# Patient Record
Sex: Female | Born: 1990 | Race: White | Hispanic: No | Marital: Single | Smoking: Never smoker
Health system: Southern US, Community
[De-identification: ages and names within clinical notes are randomized; demographics above are authoritative.]

## PROBLEM LIST (undated history)

## (undated) DIAGNOSIS — G589 Mononeuropathy, unspecified: Secondary | ICD-10-CM

## (undated) HISTORY — PX: COSMETIC SURGERY: SHX468

---

## 2021-03-29 ENCOUNTER — Emergency Department
Admission: EM | Admit: 2021-03-29 | Discharge: 2021-03-29 | Disposition: A | Discharge status: Home / Self Care | Payer: Self-pay | Attending: Emergency Medicine | Admitting: Emergency Medicine

## 2021-03-29 ENCOUNTER — Encounter: Payer: Self-pay | Admitting: Intensive Care

## 2021-03-29 ENCOUNTER — Other Ambulatory Visit: Payer: Self-pay

## 2021-03-29 DIAGNOSIS — M5441 Lumbago with sciatica, right side: Secondary | ICD-10-CM | POA: Insufficient documentation

## 2021-03-29 DIAGNOSIS — G8929 Other chronic pain: Secondary | ICD-10-CM | POA: Insufficient documentation

## 2021-03-29 DIAGNOSIS — M5442 Lumbago with sciatica, left side: Secondary | ICD-10-CM | POA: Insufficient documentation

## 2021-03-29 MED ORDER — MELOXICAM 15 MG PO TABS: 15.0000 mg | ORAL_TABLET | Freq: Every day | ORAL | 0 refills | Status: DC

## 2021-03-29 MED ORDER — METHOCARBAMOL 500 MG PO TABS: 500.0000 mg | ORAL_TABLET | Freq: Four times a day (QID) | ORAL | 0 refills | Status: DC

## 2021-03-29 MED ORDER — HYDROCODONE-ACETAMINOPHEN 5-325 MG PO TABS
1.0000 | ORAL_TABLET | Freq: Once | ORAL | Status: AC
  Administered 2021-03-29: 1 via ORAL
  Filled 2021-03-29: qty 1

## 2021-03-29 MED ORDER — ORPHENADRINE CITRATE 30 MG/ML IJ SOLN
60.0000 mg | Freq: Once | INTRAMUSCULAR | Status: AC
  Administered 2021-03-29: 60 mg via INTRAMUSCULAR
  Filled 2021-03-29: qty 2

## 2021-03-29 MED ORDER — DEXAMETHASONE SODIUM PHOSPHATE 10 MG/ML IJ SOLN
10.0000 mg | Freq: Once | INTRAMUSCULAR | Status: AC
  Administered 2021-03-29: 10 mg via INTRAMUSCULAR
  Filled 2021-03-29: qty 1
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
## 2021-03-29 NOTE — ED Notes (Signed)
ASked patient to obtain a urine specimen and reports she has a shy bladder and can't pee on spot and usually has to get a bunch of fluids before she can void.  Asked patient to put on gown and patient did not want too.  Explained she will need to get in gown for MD evaluation and if test need to be ran.  Patient attempting to obtain urine specimen.
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
## 2021-03-31 ENCOUNTER — Emergency Department
Admission: EM | Admit: 2021-03-31 | Discharge: 2021-03-31 | Disposition: A | Discharge status: Home / Self Care | Payer: Self-pay | Attending: Emergency Medicine | Admitting: Emergency Medicine

## 2021-03-31 ENCOUNTER — Emergency Department: Payer: Self-pay

## 2021-03-31 ENCOUNTER — Other Ambulatory Visit: Payer: Self-pay

## 2021-03-31 DIAGNOSIS — G43001 Migraine without aura, not intractable, with status migrainosus: Secondary | ICD-10-CM | POA: Insufficient documentation

## 2021-03-31 DIAGNOSIS — M5441 Lumbago with sciatica, right side: Secondary | ICD-10-CM | POA: Insufficient documentation

## 2021-03-31 DIAGNOSIS — R42 Dizziness and giddiness: Secondary | ICD-10-CM | POA: Insufficient documentation

## 2021-03-31 DIAGNOSIS — G8929 Other chronic pain: Secondary | ICD-10-CM | POA: Insufficient documentation

## 2021-03-31 DIAGNOSIS — R200 Anesthesia of skin: Secondary | ICD-10-CM | POA: Insufficient documentation

## 2021-03-31 DIAGNOSIS — M5442 Lumbago with sciatica, left side: Secondary | ICD-10-CM | POA: Insufficient documentation

## 2021-03-31 DIAGNOSIS — D72829 Elevated white blood cell count, unspecified: Secondary | ICD-10-CM | POA: Insufficient documentation

## 2021-03-31 DIAGNOSIS — M546 Pain in thoracic spine: Secondary | ICD-10-CM | POA: Insufficient documentation

## 2021-03-31 DIAGNOSIS — M542 Cervicalgia: Secondary | ICD-10-CM | POA: Insufficient documentation

## 2021-03-31 HISTORY — DX: Mononeuropathy, unspecified: G58.9

## 2021-03-31 LAB — CBC
HCT: 44.1 % (ref 36.0–46.0)
Hemoglobin: 14.6 g/dL (ref 12.0–15.0)
MCH: 29.3 pg (ref 26.0–34.0)
MCHC: 33.1 g/dL (ref 30.0–36.0)
MCV: 88.6 fL (ref 80.0–100.0)
Platelets: 331 10*3/uL (ref 150–400)
RBC: 4.98 MIL/uL (ref 3.87–5.11)
RDW: 12.7 % (ref 11.5–15.5)
WBC: 14.9 10*3/uL — ABNORMAL HIGH (ref 4.0–10.5)
nRBC: 0 % (ref 0.0–0.2)

## 2021-03-31 LAB — BASIC METABOLIC PANEL
Anion gap: 8 (ref 5–15)
BUN: 23 mg/dL — ABNORMAL HIGH (ref 6–20)
CO2: 26 mmol/L (ref 22–32)
Calcium: 9.5 mg/dL (ref 8.9–10.3)
Chloride: 106 mmol/L (ref 98–111)
Creatinine, Ser: 0.93 mg/dL (ref 0.44–1.00)
GFR, Estimated: >60 mL/min (ref >60)
Glucose, Bld: 103 mg/dL — ABNORMAL HIGH (ref 70–99)
Potassium: 3.6 mmol/L (ref 3.5–5.1)
Sodium: 140 mmol/L (ref 135–145)

## 2021-03-31 LAB — POC URINE PREG, ED: Preg Test, Ur: NEGATIVE

## 2021-03-31 IMAGING — CR DG LUMBAR SPINE 2-3V
1 series · 3 of 3 positions shown · non-contrast
Comparison: None.

CLINICAL DATA: Pain.

EXAM:
LUMBAR SPINE - 2-3 VIEW

[Series 1: dg lumbar spine 2-3 views · 0.14mm/px · 3 of 3 slices shown]
[im 1/3]
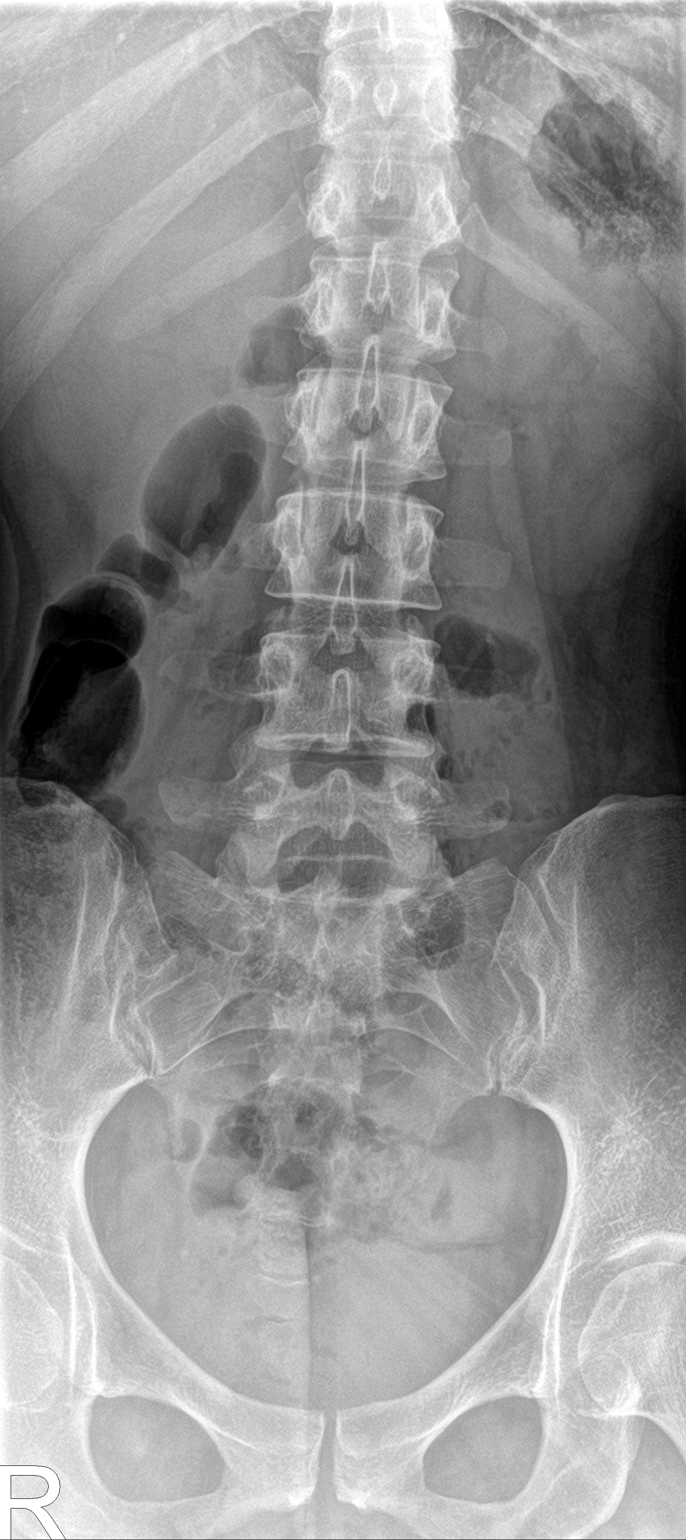
[im 2/3]
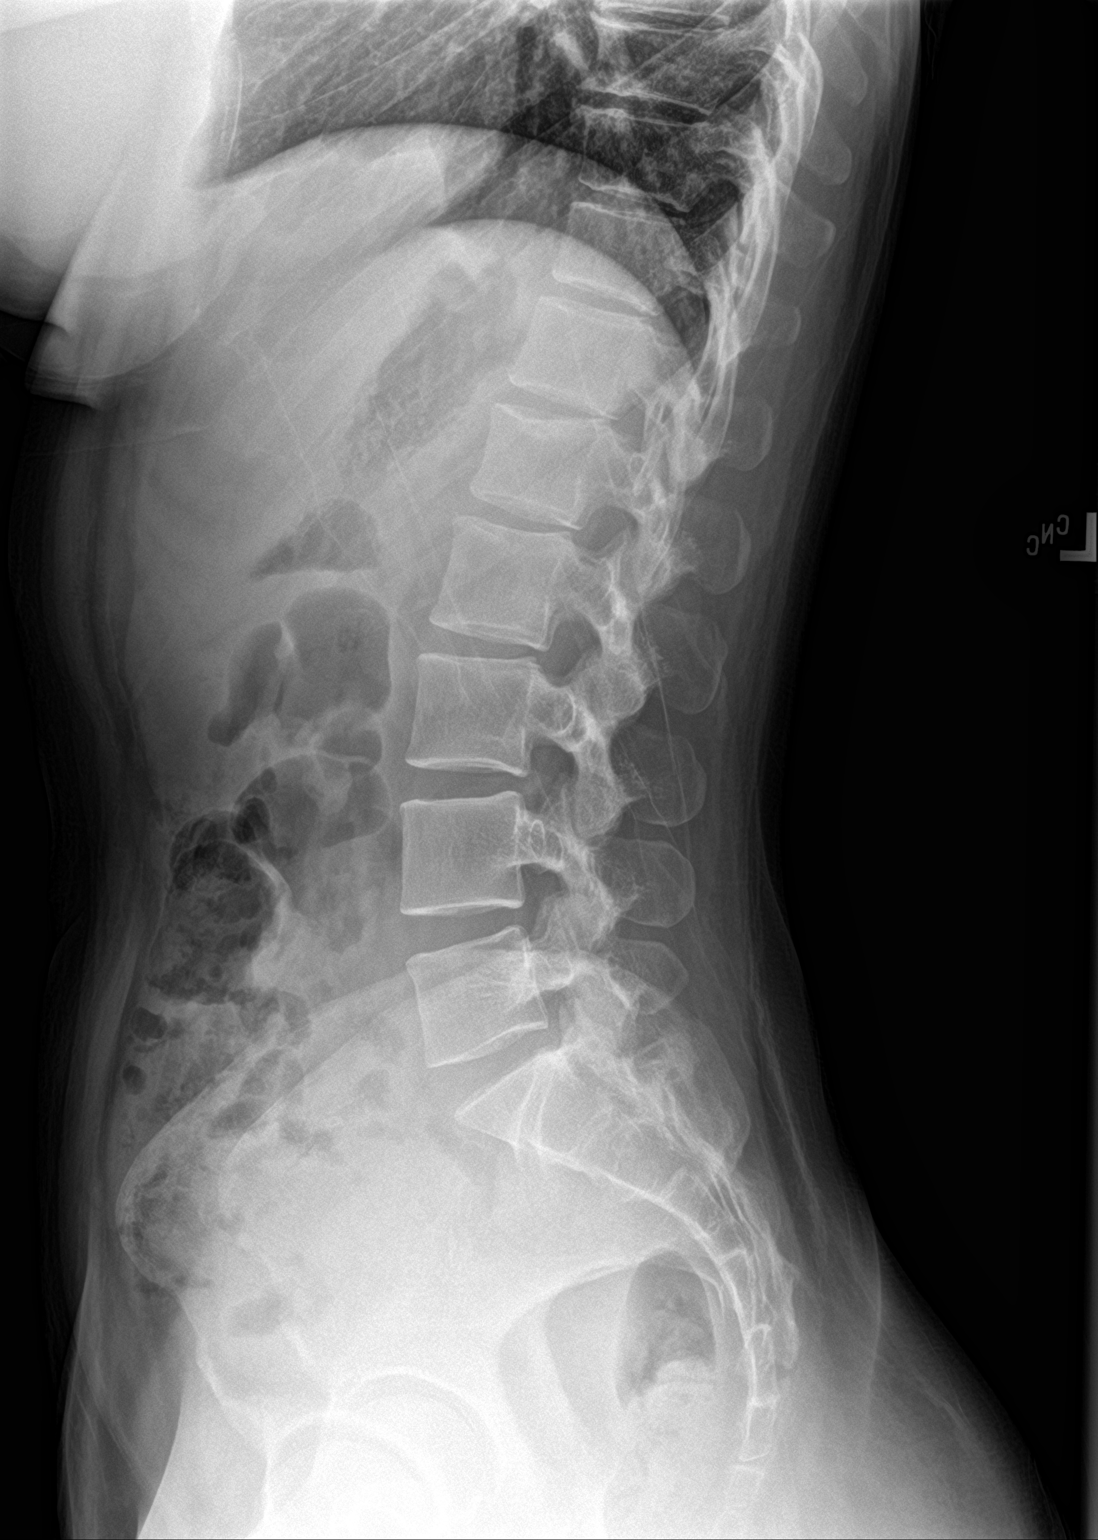
[im 3/3]
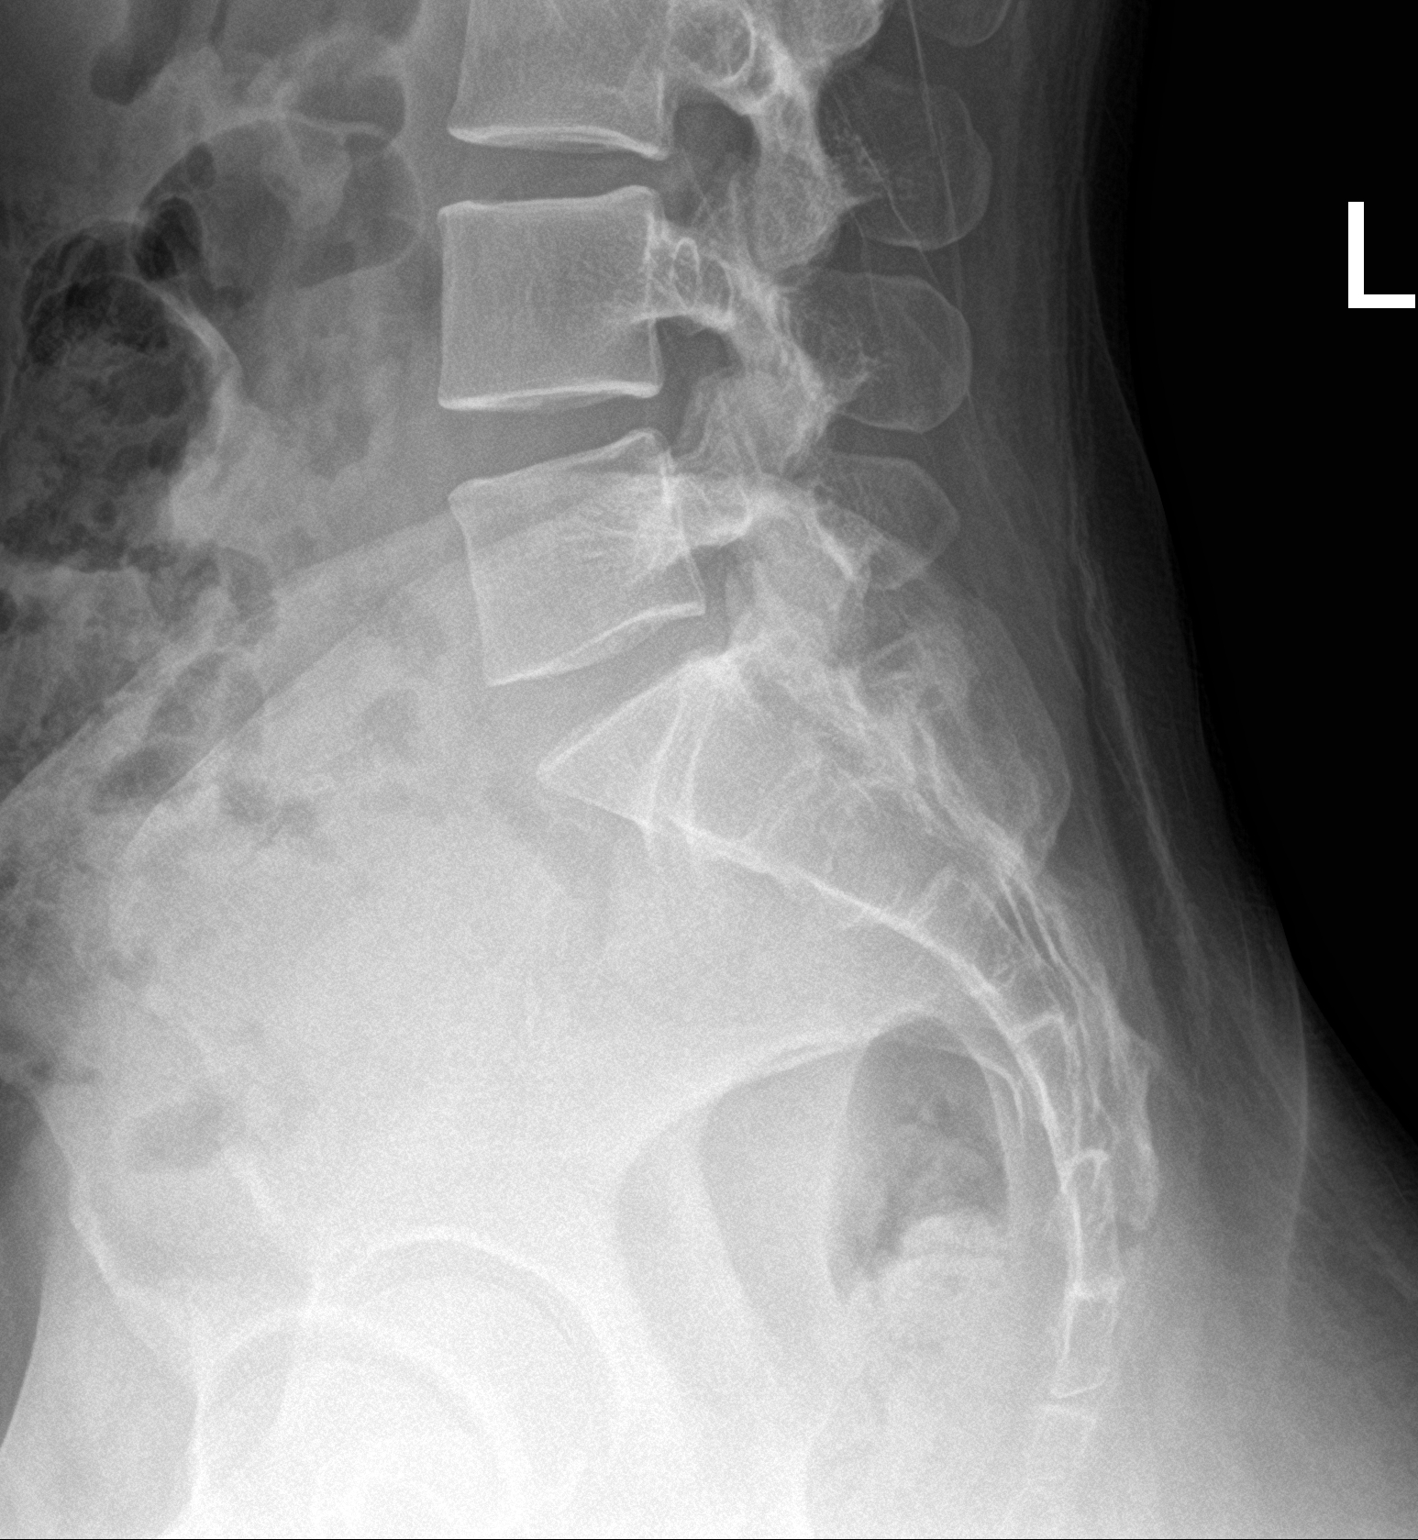

[3 of 3 positions shown; findings below may reference images not displayed]

FINDINGS: There is no evidence of lumbar spine fracture. Alignment is normal.
Intervertebral disc spaces are maintained.
IMPRESSION: Negative.

## 2021-03-31 IMAGING — CR DG THORACIC SPINE 2V
1 series · 3 of 3 positions shown · non-contrast
Comparison: None.

CLINICAL DATA: Pain.

EXAM:
THORACIC SPINE 2 VIEWS

[Series 1: dg thoracic spine 2 view · 0.14mm/px · 3 of 3 slices shown]
[im 1/3]
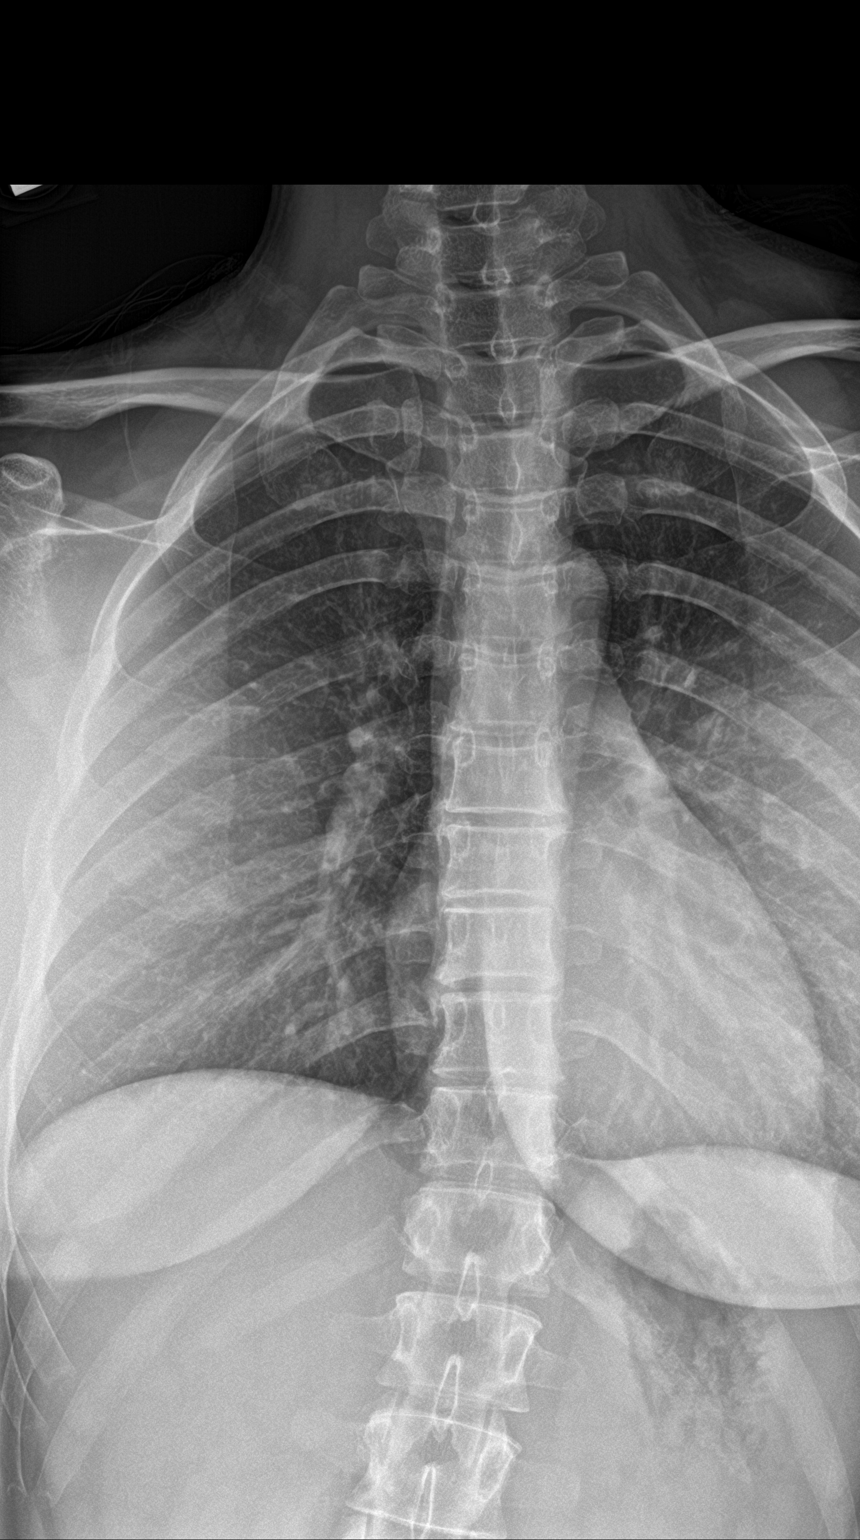
[im 2/3]
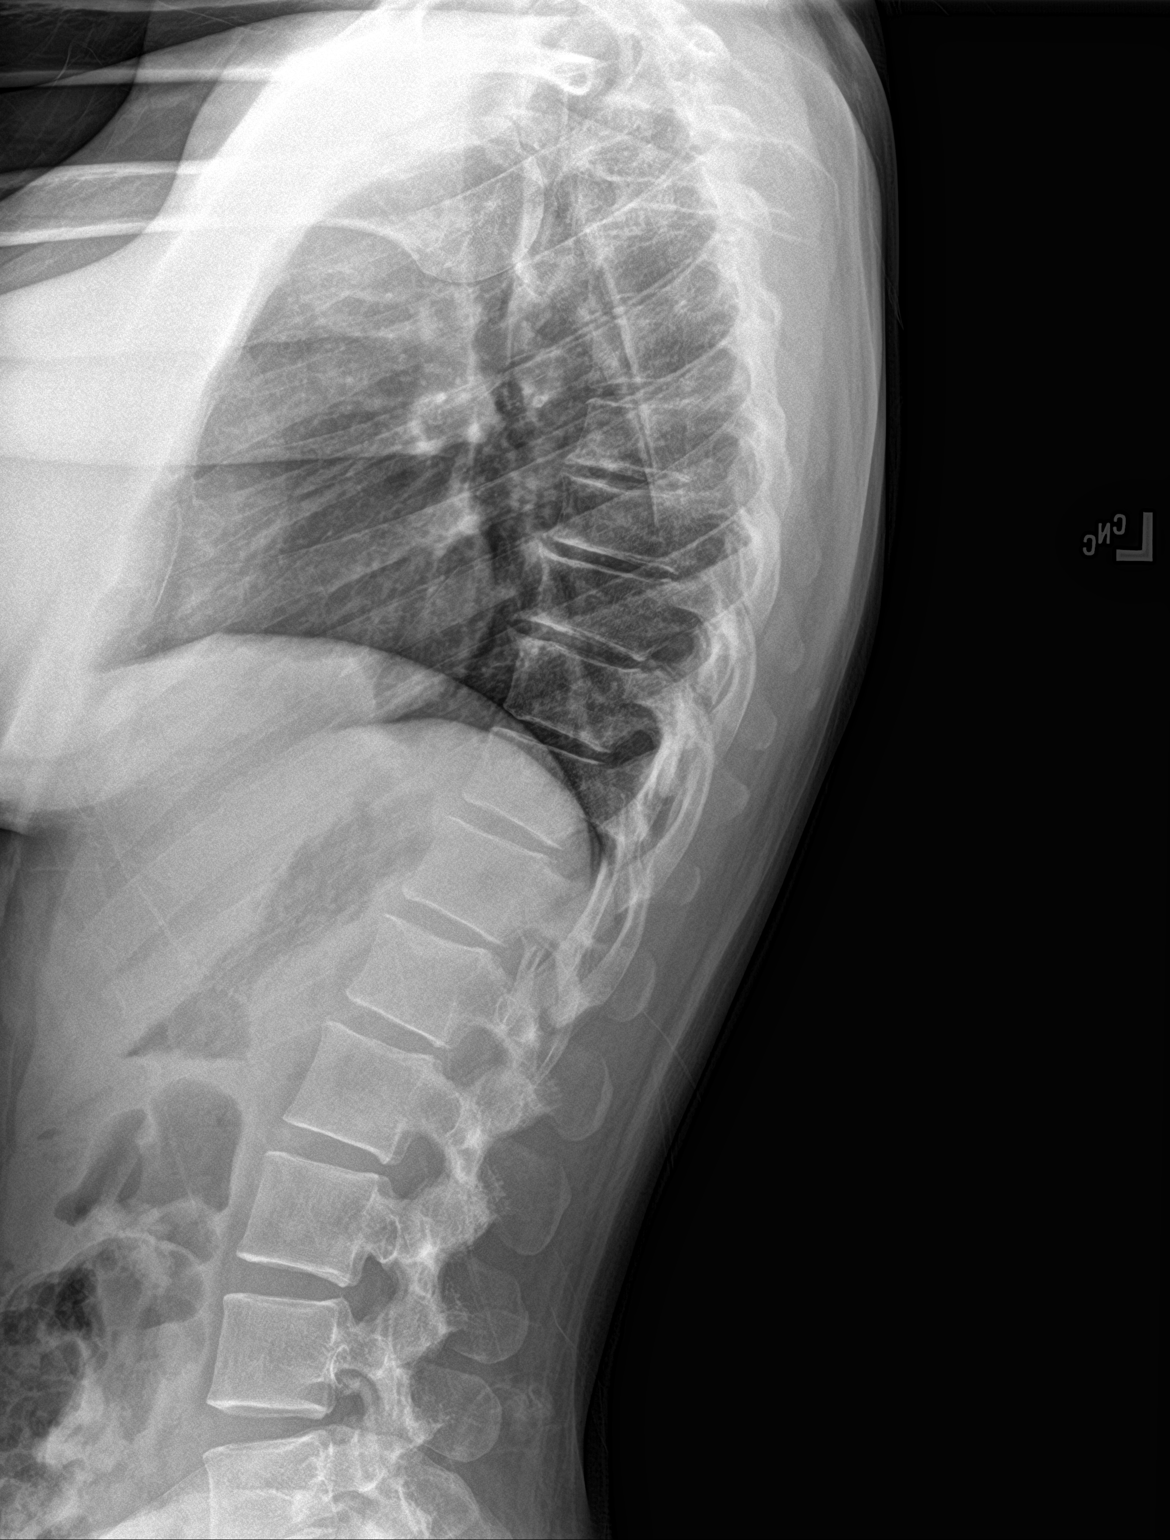
[im 3/3]
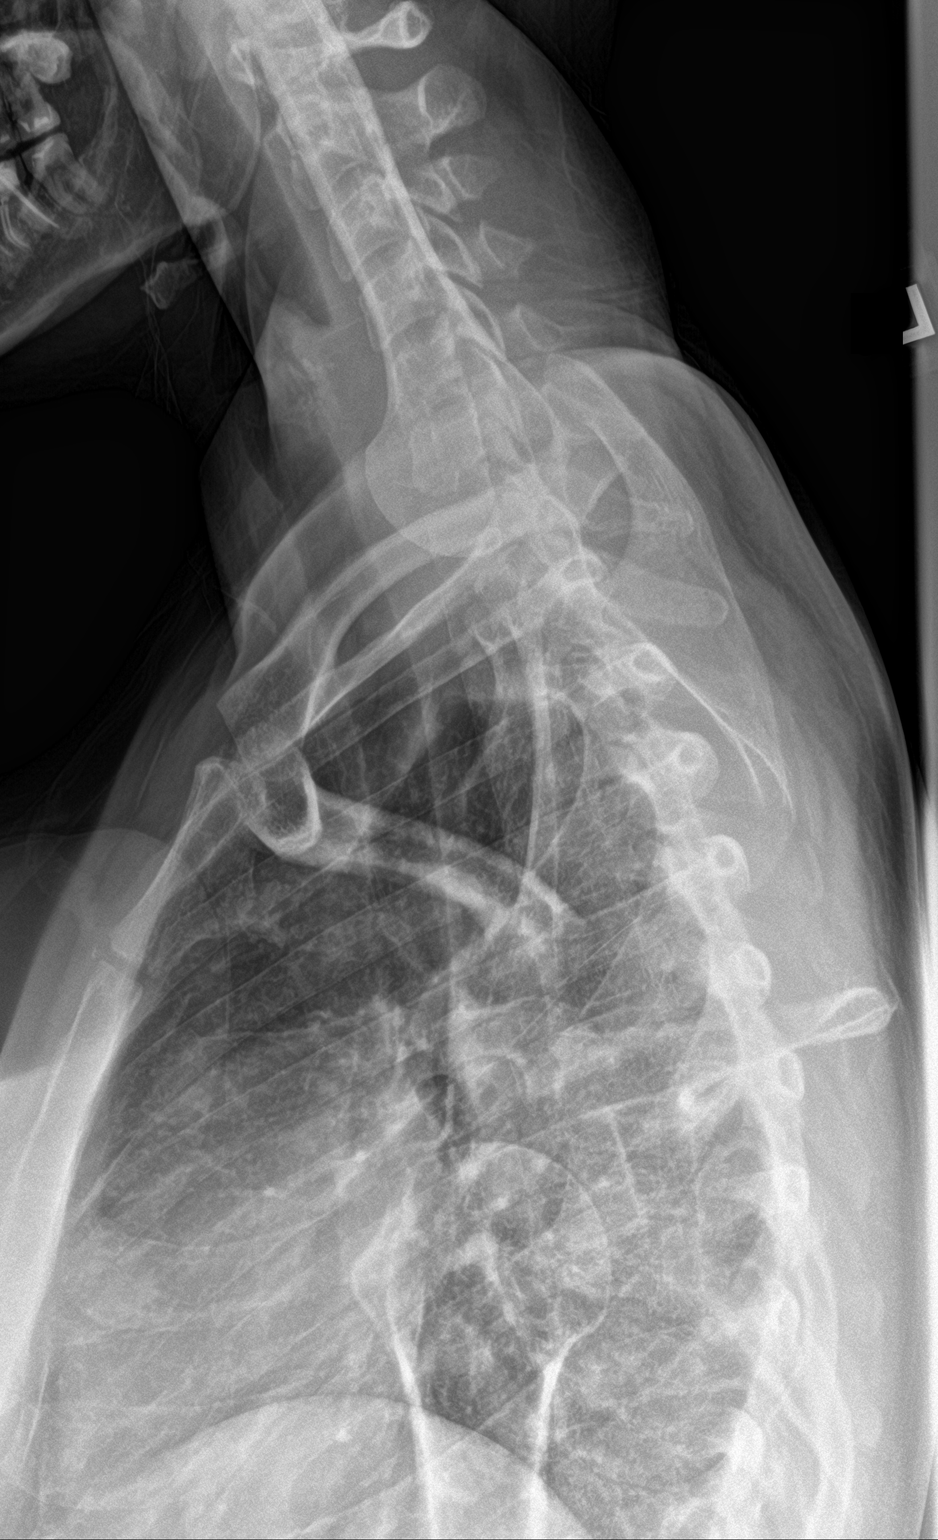

[3 of 3 positions shown; findings below may reference images not displayed]

FINDINGS: Curvature of the thoracolumbar spine, apex to the left. No other
malalignment. No fractures. Mild degenerative changes in the
midthoracic spine with tiny anterior osteophytes. No other bony or
soft tissue abnormalities.
IMPRESSION: Curvature of the thoracolumbar spine, apex to the left. Mild
degenerative changes in the midthoracic spine with small anterior
osteophytes.

## 2021-03-31 IMAGING — CR DG CERVICAL SPINE 2 OR 3 VIEWS
1 series · 3 of 3 positions shown · non-contrast
Comparison: None.

CLINICAL DATA: Pain.

EXAM:
CERVICAL SPINE - 2-3 VIEW

[Series 1: dg cervical spine 2 or 3 views · 0.14mm/px · 3 of 3 slices shown]
[im 1/3]
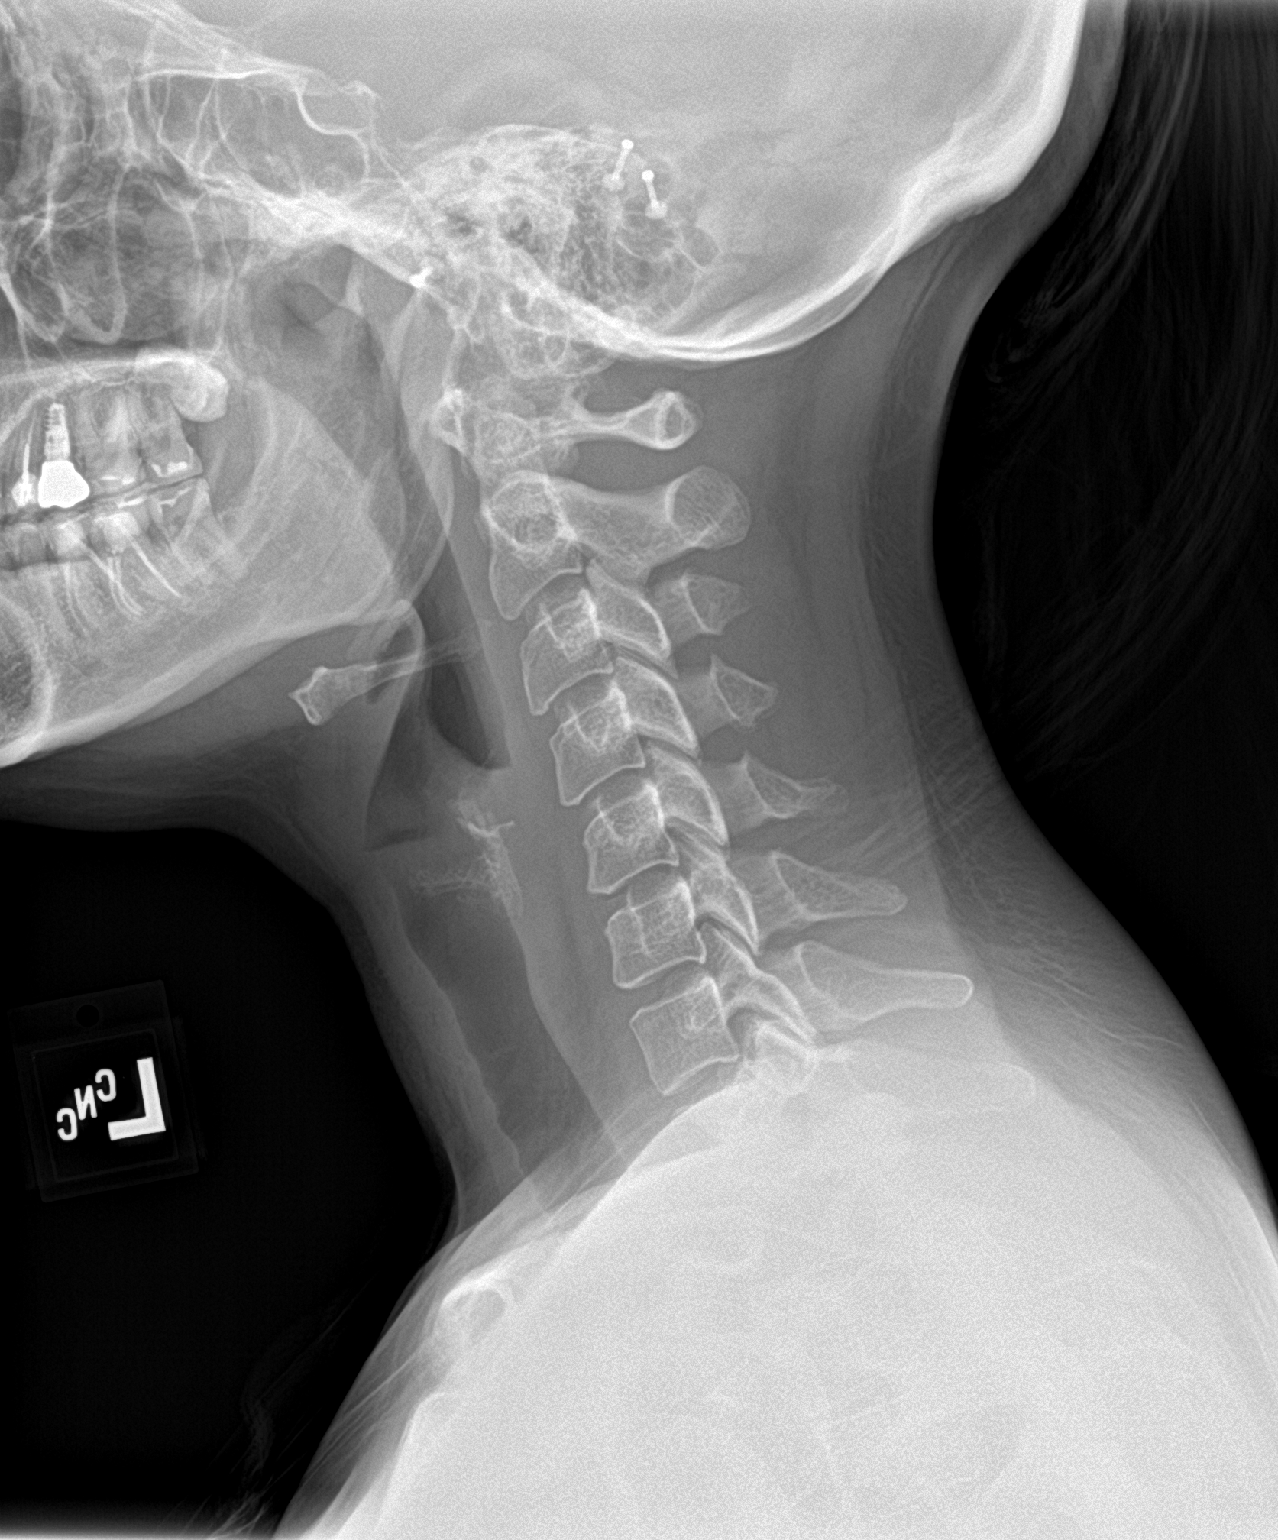
[im 2/3]
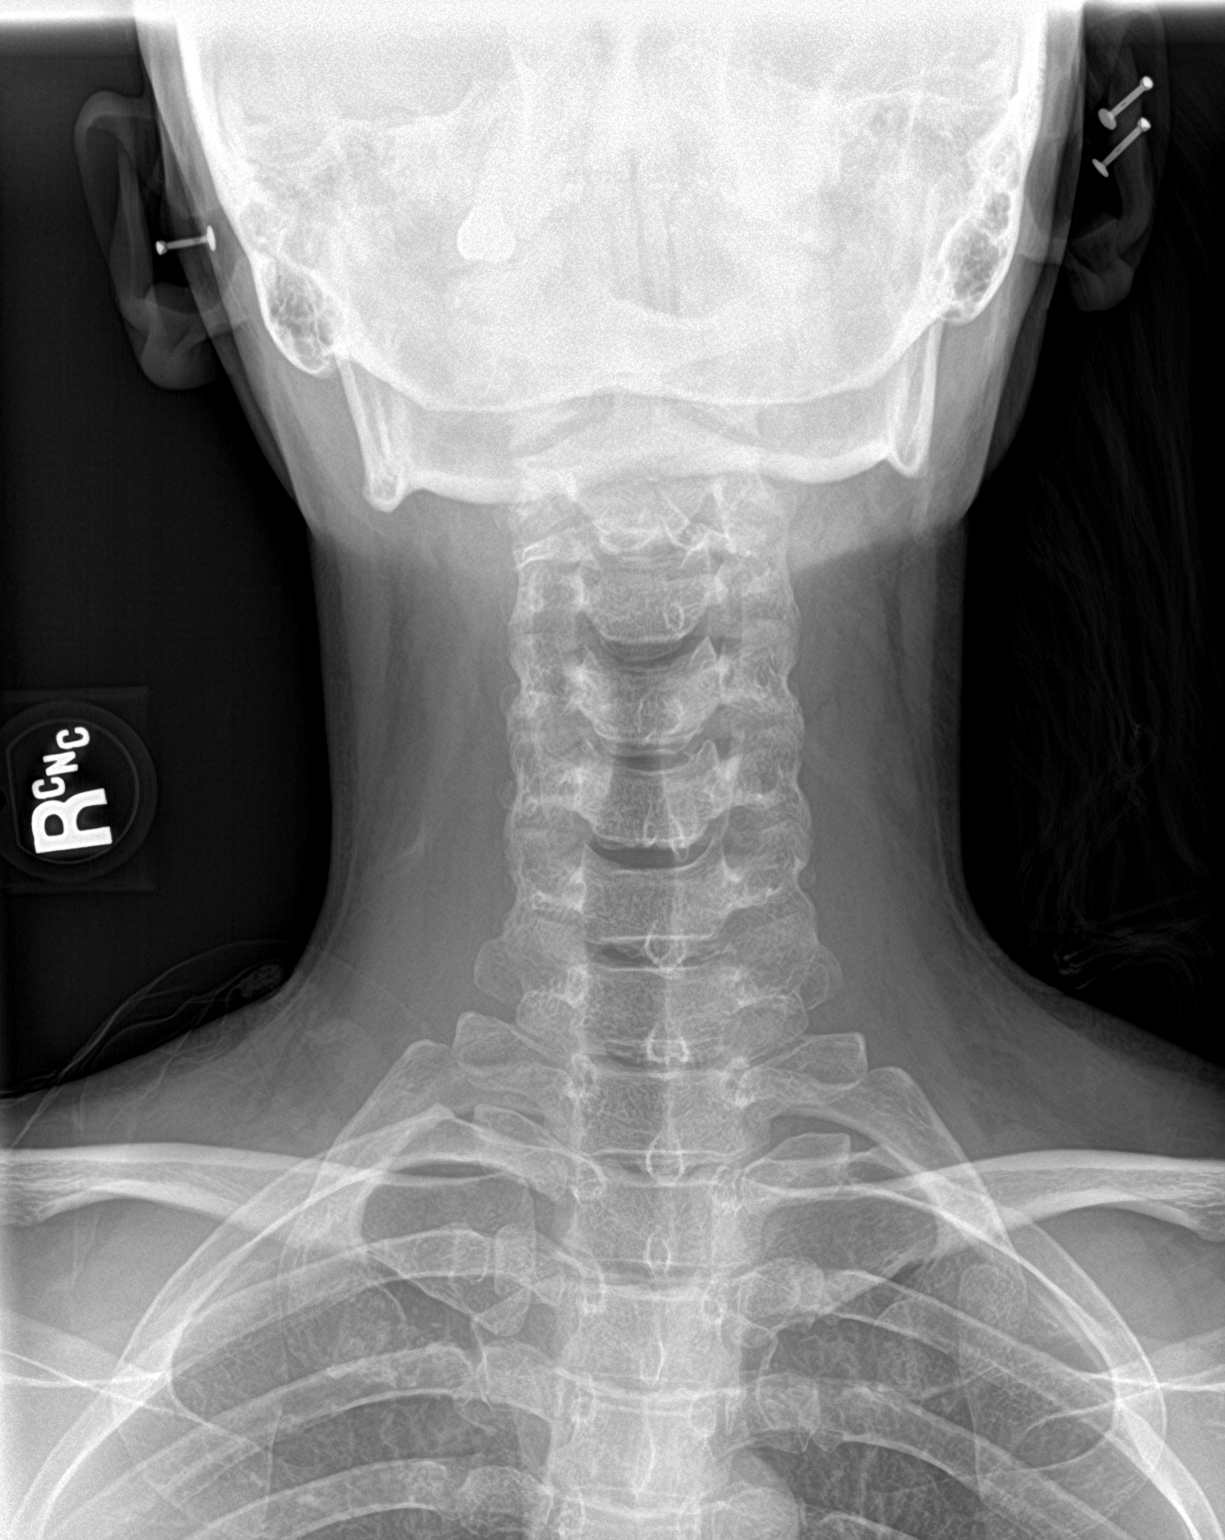
[im 3/3]
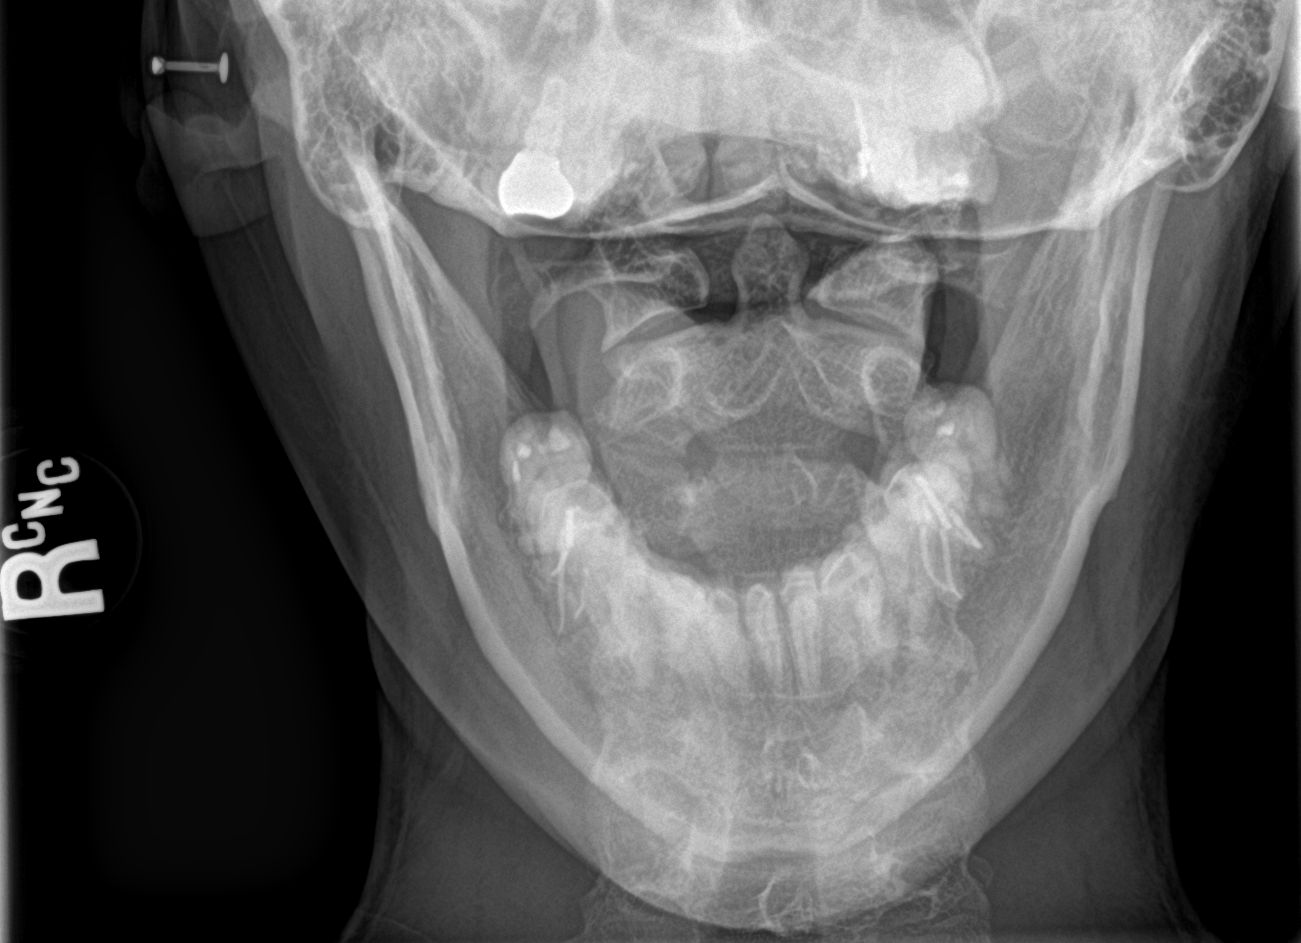

[3 of 3 positions shown; findings below may reference images not displayed]

FINDINGS: There is no evidence of cervical spine fracture or prevertebral soft
tissue swelling. Alignment is normal. No other significant bone
abnormalities are identified.
IMPRESSION: Negative cervical spine radiographs.

## 2021-03-31 MED ORDER — CYCLOBENZAPRINE HCL 10 MG PO TABS: 10.0000 mg | ORAL_TABLET | Freq: Three times a day (TID) | ORAL | 0 refills | Status: DC | PRN

## 2021-03-31 MED ORDER — SODIUM CHLORIDE 0.9 % IV BOLUS
1000.0000 mL | Freq: Once | INTRAVENOUS | Status: AC
  Administered 2021-03-31: 1000 mL via INTRAVENOUS

## 2021-03-31 MED ORDER — DIPHENHYDRAMINE HCL 50 MG/ML IJ SOLN
25.0000 mg | Freq: Once | INTRAMUSCULAR | Status: AC
Start: 2021-03-31 — End: 2021-03-31
  Administered 2021-03-31: 25 mg via INTRAVENOUS
  Filled 2021-03-31: qty 1

## 2021-03-31 MED ORDER — DEXAMETHASONE SODIUM PHOSPHATE 10 MG/ML IJ SOLN
10.0000 mg | Freq: Once | INTRAMUSCULAR | Status: AC
  Administered 2021-03-31: 10 mg via INTRAVENOUS
  Filled 2021-03-31: qty 1

## 2021-03-31 MED ORDER — ONDANSETRON HCL 4 MG/2ML IJ SOLN
4.0000 mg | Freq: Once | INTRAMUSCULAR | Status: AC
  Administered 2021-03-31: 4 mg via INTRAVENOUS
  Filled 2021-03-31: qty 2

## 2021-03-31 MED ORDER — HYDROCODONE-ACETAMINOPHEN 10-325 MG PO TABS
1.0000 | ORAL_TABLET | Freq: Once | ORAL | Status: AC
  Administered 2021-03-31: 1 via ORAL
  Filled 2021-03-31: qty 1

## 2021-03-31 MED ORDER — NAPROXEN 500 MG PO TABS: 500.0000 mg | ORAL_TABLET | Freq: Two times a day (BID) | ORAL | 0 refills | Status: DC

## 2021-03-31 MED ORDER — GABAPENTIN 100 MG PO CAPS
100.0000 mg | ORAL_CAPSULE | Freq: Once | ORAL | Status: AC
  Administered 2021-03-31: 100 mg via ORAL
  Filled 2021-03-31: qty 1

## 2021-03-31 MED ORDER — GABAPENTIN 300 MG PO CAPS: 300.0000 mg | ORAL_CAPSULE | Freq: Three times a day (TID) | ORAL | 0 refills | Status: DC
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
## 2021-03-31 NOTE — ED Notes (Signed)
Pt tried to urinate for urine sample. Was unable to urinate. IVF reconnected and migraine meds given.

## 2021-03-31 NOTE — Discharge Instructions (Signed)
You were seen today for low back pain with sciatica.  Your x-ray of your lumbar spine was normal.  I have given you prescriptions for naproxen 2 times daily.  Please do not take this with any other anti-inflammatories and consume with food.  I have also given you prescriptions for muscle relaxers and nerve pain medication.  Be aware that these may cause sedation.  Please follow-up with neurosurgery and physiatry as previously discussed

## 2021-03-31 NOTE — ED Notes (Signed)
Pt transported to xray 

## 2021-03-31 NOTE — ED Notes (Signed)
Provider at beside. Pt was seen here 2 days ago and has chronic pain from "pinched nerves". Pt states she now has HA (temples, and behind eyes) and also complains of L knee pain after her dog ran into her this morning.   Pt complains of burning and shooting pain down both legs and in lower back.   Pt moved to  from [place] 2 weeks ago. Use dto go to pain clinic but states has not had pain meds for 1 year.   Denies bowel or bladder problems.

## 2021-03-31 NOTE — ED Triage Notes (Signed)
Pt states she is here for her "pinched nerves"- pt states she is having pain all over her body and was seen here for it 2 days ago and was given some medicine that was supposed to last her 3 days but she is still in pain- pt also endorses migraine

## 2021-04-01 ENCOUNTER — Other Ambulatory Visit: Payer: Self-pay

## 2021-04-01 ENCOUNTER — Encounter: Payer: Self-pay | Admitting: *Deleted

## 2021-04-01 ENCOUNTER — Emergency Department
Admission: EM | Admit: 2021-04-01 | Discharge: 2021-04-02 | Disposition: A | Discharge status: Home / Self Care | Payer: Self-pay | Attending: Emergency Medicine | Admitting: Emergency Medicine

## 2021-04-01 DIAGNOSIS — M5442 Lumbago with sciatica, left side: Secondary | ICD-10-CM | POA: Insufficient documentation

## 2021-04-01 DIAGNOSIS — G8929 Other chronic pain: Secondary | ICD-10-CM | POA: Insufficient documentation

## 2021-04-01 DIAGNOSIS — M5441 Lumbago with sciatica, right side: Secondary | ICD-10-CM | POA: Insufficient documentation

## 2021-04-01 MED ORDER — HYDROCODONE-ACETAMINOPHEN 5-325 MG PO TABS
1.0000 | ORAL_TABLET | ORAL | Status: AC
  Administered 2021-04-01: 1 via ORAL
  Filled 2021-04-01: qty 1

## 2021-04-01 MED ORDER — CYCLOBENZAPRINE HCL 5 MG PO TABS: 5.0000 mg | ORAL_TABLET | Freq: Three times a day (TID) | ORAL | 0 refills | Status: DC | PRN

## 2021-04-01 MED ORDER — DEXAMETHASONE SODIUM PHOSPHATE 10 MG/ML IJ SOLN
10.0000 mg | Freq: Once | INTRAMUSCULAR | Status: AC
  Administered 2021-04-01: 10 mg via INTRAMUSCULAR
  Filled 2021-04-01: qty 1

## 2021-04-01 MED ORDER — GABAPENTIN 300 MG PO CAPS: 300.0000 mg | ORAL_CAPSULE | Freq: Three times a day (TID) | ORAL | 0 refills | Status: DC

## 2021-04-01 NOTE — ED Provider Notes (Signed)
[facility] EMERGENCY DEPARTMENT Provider Note   CSN: [id number] Arrival date & time: 04/01/21  2049     History  Chief Complaint  Patient presents with   Back Pain    Kelsey Ho is a 31 y.o. female presents to the emergency department for evaluation of her chronic low back pain and lumbar radiculopathy.  She has moved here from [place], was in a pain clinic in [place] receiving Norco, oxycodone and Xanax.  She states the physician got into some legal trouble and she stopped receiving her prescriptions.  She admits to taking some medications on the street to try to help treat her pain and prevent withdrawals.  She recently moved to [place] and has been without narcotics for the last couple of weeks.  She was seen yesterday and prescribed Flexeril, gabapentin and naproxen.  Patient states her dog chewed up the prescriptions.  She is requesting refills of these medications today.  She has been seen a couple times here in the ER and gotten some relief with IM dexamethasone.  She denies any loss of bowel or bladder symptoms.  She is ambulatory with no antalgic gait.  No weakness in the lower extremities.  She states she has a very active job and has a lot of pain while performing her job  HPI     Home Medications Prior to Admission medications   Medication Sig Start Date End Date Taking? Authorizing Provider  cyclobenzaprine (FLEXERIL) 5 MG tablet Take 1-2 tablets (5-10 mg total) by mouth 3 (three) times daily as needed for muscle spasms. 04/01/21  Yes Evon Slack, PA-C  gabapentin (NEURONTIN) 300 MG capsule Take 1 capsule (300 mg total) by mouth 3 (three) times daily. 04/01/21 04/01/22 Yes Evon Slack, PA-C  naproxen (NAPROSYN) 500 MG tablet Take 1 tablet (500 mg total) by mouth 2 (two) times daily with a meal. 03/31/21   Baity, Salvadore Oxford, NP      Allergies    Patient has no known allergies.    Review of Systems   Review of Systems  Constitutional:   Negative for chills and fever.  Gastrointestinal:  Negative for abdominal pain, nausea and vomiting.  Genitourinary:  Negative for dysuria.  Musculoskeletal:  Positive for back pain.   Physical Exam Updated Vital Signs BP 111/77 (BP Location: Right Arm)    Pulse 83    Temp 98.7 F (37.1 C) (Oral)    Resp 18    Ht 5\' 1"  (1.549 m)    Wt 54.4 kg    LMP  (LMP Unknown)    SpO2 100%    BMI 22.67 kg/m  Physical Exam Constitutional:      Appearance: She is well-developed.  HENT:     Head: Normocephalic and atraumatic.  Eyes:     Conjunctiva/sclera: Conjunctivae normal.  Cardiovascular:     Rate and Rhythm: Normal rate.  Pulmonary:     Effort: Pulmonary effort is normal. No respiratory distress.  Musculoskeletal:     Cervical back: Normal range of motion.     Comments: Lumbar Spine: Examination of the lumbar spine reveals no bony abnormality, no edema, and no ecchymosis.  No swelling.  No spinous process tenderness.  Painful bilateral straight leg raise.  Bilateral Lower Extremities: Examination of the lower extremities reveals no bony abnormality, no edema, and no ecchymosis.  The patient has full active and passive range of motion of the hips, knees, and ankles.  There is no discomfort with range of  motion exercises.  The patient is non tender along the greater trochanter region.  The patient has a negative Denna Haggard' test bilaterally.  There is normal skin warmth.  There is normal capillary refill bilaterally.    Neurologic:  Patient able to heel walk and toe walk.  The patient has normal muscle strength testing for the quadriceps, calves, ankle dorsiflexion, ankle plantarflexion, and extensor hallicus longus.  The patient has sensation that is intact to light touch.  Patellar tendon reflexes are normal bilaterally.  No clonus noted bilaterally.  Skin:    General: Skin is warm.     Findings: No rash.  Neurological:     General: No focal deficit present.     Mental Status: She is alert and  oriented to person, place, and time. Mental status is at baseline.  Psychiatric:        Behavior: Behavior normal.        Thought Content: Thought content normal.    ED Results / Procedures / Treatments   Labs (all labs ordered are listed, but only abnormal results are displayed) Labs Reviewed - No data to display  EKG None  Radiology DG Cervical Spine 2-3 Views  Result Date: 03/31/2021 CLINICAL DATA:  Pain. EXAM: CERVICAL SPINE - 2-3 VIEW COMPARISON:  None. FINDINGS: There is no evidence of cervical spine fracture or prevertebral soft tissue swelling. Alignment is normal. No other significant bone abnormalities are identified. IMPRESSION: Negative cervical spine radiographs. Electronically Signed   By: Gerome Sam III M.D.   On: 03/31/2021 16:22   DG Thoracic Spine 2 View  Result Date: 03/31/2021 CLINICAL DATA:  Pain. EXAM: THORACIC SPINE 2 VIEWS COMPARISON:  None. 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 No other malalignment. No fractures. Mild degenerative changes in the midthoracic spine with tiny anterior osteophytes. No other bony or soft tissue abnormalities. 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 Mild degenerative changes in the midthoracic spine with small anterior osteophytes. Electronically Signed   By: Gerome Sam III M.D.   On: 03/31/2021 16:25   DG Lumbar Spine 2-3 Views  Result Date: 03/31/2021 CLINICAL DATA:  Pain. EXAM: LUMBAR SPINE - 2-3 VIEW COMPARISON:  None. FINDINGS: There is no evidence of lumbar spine fracture. Alignment is normal. Intervertebral disc spaces are maintained. IMPRESSION: Negative. Electronically Signed   By: Gerome Sam III M.D.   On: 03/31/2021 16:23    Procedures Procedures    Medications Ordered in ED Medications  dexamethasone (DECADRON) injection 10 mg (10 mg Intramuscular Given 04/01/21 2231)  HYDROcodone-acetaminophen (NORCO/VICODIN) 5-325 MG per tablet 1 tablet (1 tablet Oral  Given 04/01/21 2231)    ED Course/ Medical Decision Making/ A&P                           Medical Decision Making Risk Prescription drug management.   31 year old female with chronic low back pain with bilateral lumbar radiculopathy.  She has no weakness or neurological deficits on exam.  Vital signs are stable, afebrile.  She had negative x-rays of her lumbar spine, cervical spine and thoracic spine yesterday.  I do not see any indication for MRI today.  Patient with history of chronic narcotic use, will give 1 pain tablet here but no prescriptions for narcotics.  Will send home with Flexeril, gabapentin.  She will continue with naproxen.  She will call to schedule follow-up appointment with a PCP, orthopedist or neurosurgeon or pain  clinic specialist.  She currently is without insurance, will be receiving Medicaid at the end of this month.  Patient understands signs and symptoms return to the ER for. Final Clinical Impression(s) / ED Diagnoses Final diagnoses:  Chronic bilateral low back pain with bilateral sciatica    Rx / DC Orders ED Discharge Orders          Ordered    gabapentin (NEURONTIN) 300 MG capsule  3 times daily        04/01/21 2305    cyclobenzaprine (FLEXERIL) 5 MG tablet  3 times daily PRN        04/01/21 2305              Evon Slack, PA-C 04/01/21 2327    Chesley Noon, MD 04/01/21 2352

## 2021-04-01 NOTE — ED Triage Notes (Signed)
Pt has back pain.  Hx chronic pain.  Pt radiates into right leg.  Pt alert.

## 2021-04-01 NOTE — Discharge Instructions (Signed)
Please schedule an appointment to see orthopedist, primary care provider or pain management clinic to discuss your chronic pain.  Return to the ER for any fevers, loss of bowel or bladder symptoms, weakness or severe increasing pain.  Take gabapentin, Flexeril and naproxen as prescribed.

## 2021-04-05 ENCOUNTER — Emergency Department
Admission: EM | Admit: 2021-04-05 | Discharge: 2021-04-05 | Disposition: A | Discharge status: Home / Self Care | Payer: Self-pay | Attending: Emergency Medicine | Admitting: Emergency Medicine

## 2021-04-05 ENCOUNTER — Other Ambulatory Visit: Payer: Self-pay

## 2021-04-05 DIAGNOSIS — M546 Pain in thoracic spine: Secondary | ICD-10-CM | POA: Insufficient documentation

## 2021-04-05 DIAGNOSIS — M545 Low back pain, unspecified: Secondary | ICD-10-CM | POA: Insufficient documentation

## 2021-04-05 DIAGNOSIS — G8929 Other chronic pain: Secondary | ICD-10-CM | POA: Insufficient documentation

## 2021-04-05 DIAGNOSIS — M5440 Lumbago with sciatica, unspecified side: Secondary | ICD-10-CM

## 2021-04-05 MED ORDER — CYCLOBENZAPRINE HCL 10 MG PO TABS: 10.0000 mg | ORAL_TABLET | Freq: Three times a day (TID) | ORAL | 0 refills | Status: DC | PRN

## 2021-04-05 MED ORDER — OXYCODONE-ACETAMINOPHEN 7.5-325 MG PO TABS: 1.0000 | ORAL_TABLET | Freq: Four times a day (QID) | ORAL | 0 refills | Status: DC | PRN

## 2021-04-05 MED ORDER — OXYCODONE-ACETAMINOPHEN 7.5-325 MG PO TABS
1.0000 | ORAL_TABLET | Freq: Four times a day (QID) | ORAL | 0 refills | Status: DC | PRN
Start: 2021-04-05 — End: 2021-04-05

## 2021-04-05 MED ORDER — NAPROXEN 500 MG PO TABS: 500.0000 mg | ORAL_TABLET | Freq: Two times a day (BID) | ORAL | Status: DC

## 2021-04-05 NOTE — ED Provider Notes (Signed)
[facility] Provider Note    Event Date/Time   First MD Initiated Contact with Patient 04/05/21 1637     (approximate)   History   Back Pain   HPI Chronic back pain Kelsey Ho is a 31 y.o. female patient presents with chronic upper and lower back pain.  This is patient's fourth visit this month for this complaint.  Patient was referred to physical therapy and had initial consult on 04/03/2021.  Patient stated her dog ate her gabapentin and she notified emergency room doctor on 04/01/2020.  Patient was given another prescription for gabapentin.  Patient recently relocated to this area and was unable to see her narcotic usage log.  Patient states due to the findings of her x-ray further imaging  with MRI is pending.  Patient that her back pain has radicular component to the bilateral lower extremities.  Patient denies bladder or bowel dysfunction.   Physical Exam   Triage Vital Signs: ED Triage Vitals [04/05/21 1454]  Enc Vitals Group     BP (!) 135/98     Pulse Rate (!) 117     Resp 18     Temp 98.8 F (37.1 C)     Temp Source Oral     SpO2 98 %     Weight 119 lb 14.9 oz (54.4 kg)     Height 5\' 1"  (1.549 m)     Head Circumference      Peak Flow      Pain Score 10     Pain Loc      Pain Edu?      Excl. in [place]?     Most recent vital signs: Vitals:   04/05/21 1454  BP: (!) 135/98  Pulse: (!) 117  Resp: 18  Temp: 98.8 F (37.1 C)  SpO2: 98%    General: Awake, no distress.  CV:  Good peripheral perfusion.  Resp:  Normal effort.  Abd:  No distention.  Other:     ED Results / Procedures / Treatments   Labs (all labs ordered are listed, but only abnormal results are displayed) Labs Reviewed - No data to display     RADIOLOGY Reviewed radiology report from this facility showing curvature of the thoracolumbar spine.  There is also mild degenerative changes in the mid thoracic spine.   PROCEDURES:  Critical Care performed:  No  Procedures   MEDICATIONS ORDERED IN ED: Medications - No data to display   IMPRESSION / MDM / [place] / ED COURSE  I reviewed the triage vital signs and the nursing notes.                              Differential diagnosis includes, but is not limited to, pain secondary to strain, arthritis, neck or back pain secondary to HNP.  Patient gabapentin, Flexeril, and naproxen were refilled.  Patient also given a prescription for 3 days of Percocet.  Patient advised to follow-up with scheduled physical therapy.  Advised patient refills of narcotic medications are not routinely done in emergency room.       FINAL CLINICAL IMPRESSION(S) / ED DIAGNOSES   Final diagnoses:  Chronic low back pain with sciatica, sciatica laterality unspecified, unspecified back pain laterality     Rx / DC Orders   ED Discharge Orders          Ordered    oxyCODONE-acetaminophen (PERCOCET) 7.5-325 MG tablet  Every  6 hours PRN        04/05/21 1658    cyclobenzaprine (FLEXERIL) 10 MG tablet  3 times daily PRN        04/05/21 1658    naproxen (NAPROSYN) 500 MG tablet  2 times daily with meals        04/05/21 1658             Note:  This document was prepared using Dragon voice recognition software and may include unintentional dictation errors.    Sable Feil, PA-C 04/05/21 1711    Nena Polio, MD 04/05/21 1925

## 2021-04-05 NOTE — ED Triage Notes (Signed)
Pt here with back pain. Pt moved here 3 weeks ago and has not gotten a new primary. Pt states the pain is in the middle of her back and radiates to her bilateral legs. Pt in NAD in triage.

## 2021-04-05 NOTE — Discharge Instructions (Addendum)
Read and follow discharge care instruction.  Be advised refills of pain medication not routinely done in the emergency room.

## 2021-04-10 ENCOUNTER — Emergency Department
Admission: EM | Admit: 2021-04-10 | Discharge: 2021-04-10 | Disposition: A | Discharge status: Home / Self Care | Payer: Self-pay | Attending: Emergency Medicine | Admitting: Emergency Medicine

## 2021-04-10 ENCOUNTER — Other Ambulatory Visit: Payer: Self-pay

## 2021-04-10 ENCOUNTER — Encounter: Payer: Self-pay | Admitting: Emergency Medicine

## 2021-04-10 DIAGNOSIS — Z760 Encounter for issue of repeat prescription: Secondary | ICD-10-CM | POA: Insufficient documentation

## 2021-04-10 DIAGNOSIS — M79605 Pain in left leg: Secondary | ICD-10-CM | POA: Insufficient documentation

## 2021-04-10 DIAGNOSIS — G8929 Other chronic pain: Secondary | ICD-10-CM | POA: Insufficient documentation

## 2021-04-10 DIAGNOSIS — M549 Dorsalgia, unspecified: Secondary | ICD-10-CM | POA: Insufficient documentation

## 2021-04-10 MED ORDER — CYCLOBENZAPRINE HCL 10 MG PO TABS
10.0000 mg | ORAL_TABLET | Freq: Once | ORAL | Status: AC
  Administered 2021-04-10: 10 mg via ORAL
  Filled 2021-04-10: qty 1

## 2021-04-10 MED ORDER — CYCLOBENZAPRINE HCL 10 MG PO TABS: 10.0000 mg | ORAL_TABLET | Freq: Three times a day (TID) | ORAL | 0 refills | Status: DC | PRN

## 2021-04-10 MED ORDER — NAPROXEN 500 MG PO TABS: 500.0000 mg | ORAL_TABLET | Freq: Two times a day (BID) | ORAL | Status: DC

## 2021-04-10 MED ORDER — KETOROLAC TROMETHAMINE 30 MG/ML IJ SOLN
30.0000 mg | Freq: Once | INTRAMUSCULAR | Status: AC
Start: 2021-04-10 — End: 2021-04-10
  Administered 2021-04-10: 30 mg via INTRAMUSCULAR
  Filled 2021-04-10: qty 1

## 2021-04-10 NOTE — ED Provider Notes (Signed)
[facility] Provider Note    Event Date/Time   First MD Initiated Contact with Patient 04/10/21 1655     (approximate)   History   Medication Refill   HPI  Kelsey Ho is a 31 y.o. female with a history of chronic back pain with sciatica.  Patient presents requesting refill of hydrocodone.  No change in her chronic pain, she continues have pain which does occasionally radiate into her left buttock and leg.  No weakness saddle anesthesia.  No fevers or chills.  No nausea or vomiting or new symptoms.  Notably the patient has been seen in the emergency department 4 times in 15 days requesting refills     Physical Exam   Triage Vital Signs: ED Triage Vitals  Enc Vitals Group     BP 04/10/21 1624 (!) 127/93     Pulse Rate 04/10/21 1624 (!) 134     Resp 04/10/21 1624 17     Temp 04/10/21 1624 98 F (36.7 C)     Temp Source 04/10/21 1624 Oral     SpO2 04/10/21 1624 98 %     Weight 04/10/21 1620 54.4 kg (119 lb 14.9 oz)     Height 04/10/21 1620 1.549 m (5\' 1" )     Head Circumference --      Peak Flow --      Pain Score 04/10/21 1620 10     Pain Loc --      Pain Edu? --      Excl. in [place]? --     Most recent vital signs: Vitals:   04/10/21 1624  BP: (!) 127/93  Pulse: (!) 134  Resp: 17  Temp: 98 F (36.7 C)  SpO2: 98%     General: Awake, no distress.  Anxious CV:  Good peripheral perfusion.  Resp:  Normal effort.  Abd:  No distention.  Other:  Normal ambulation without difficulty, neuro intact, normal strength in lower extremities, no saddle anesthesia, no vertebral tenderness palpation   ED Results / Procedures / Treatments   Labs (all labs ordered are listed, but only abnormal results are displayed) Labs Reviewed - No data to display   EKG     RADIOLOGY     PROCEDURES:  Critical Care performed:   Procedures   MEDICATIONS ORDERED IN ED: Medications  ketorolac (TORADOL) 30 MG/ML injection 30 mg (30 mg Intramuscular  Given 04/10/21 1712)  cyclobenzaprine (FLEXERIL) tablet 10 mg (10 mg Oral Given 04/10/21 1712)     IMPRESSION / MDM / ASSESSMENT AND PLAN / ED COURSE  I reviewed the triage vital signs and the nursing notes.  Patient presents with chronic back pain as detailed above.  Exam is unchanged from before, she is mildly tachycardic related to anxiety and is tearful.  Exam is reassuring, neuro intact.  Will treat with IM Toradol, p.o. Flexeril, strongly urged her to follow-up as an outpatient as directed  Informed her that narcotic refills in the emergency department are not appropriate          FINAL CLINICAL IMPRESSION(S) / ED DIAGNOSES   Final diagnoses:  Encounter for medication refill  Other chronic pain     Rx / DC Orders   ED Discharge Orders          Ordered    cyclobenzaprine (FLEXERIL) 10 MG tablet  3 times daily PRN        04/10/21 1719    naproxen (NAPROSYN) 500 MG tablet  2 times daily  with meals        04/10/21 1719             Note:  This document was prepared using Dragon voice recognition software and may include unintentional dictation errors.   Jene Every, MD 04/10/21 1719

## 2021-04-10 NOTE — ED Notes (Addendum)
Pt. To ED for refill of medications.  Pt. Moved from [place] 3 weeks ago and is out of her Hydrocodone, xanax, and ambien. Pt. States she has been getting those meds off the street since she cannot get anyone to see her because she does not have insurance. Pt was seen by Dr. Alba Destine from neuro for nerve pain. Dr. Alba Destine prescribed her muscle relaxers and steroids. Pt. States she has also taken gabapentin in the past.

## 2021-04-10 NOTE — ED Triage Notes (Signed)
Pt comes into the ED via POV c/o medication refill for hydrocodone acetaminophen.  Pt states she doesn't have an MD and does have insurance until March.  Pt states she has chronic pain with sciatica.  Pt ambulatory to triage at this time.  Pt states her last dose of pain medicine was 2 days ago.

## 2021-05-01 ENCOUNTER — Other Ambulatory Visit: Payer: Self-pay

## 2021-05-01 ENCOUNTER — Emergency Department
Admission: EM | Admit: 2021-05-01 | Discharge: 2021-05-01 | Disposition: A | Discharge status: Home / Self Care | Payer: 59 | Attending: Emergency Medicine | Admitting: Emergency Medicine

## 2021-05-01 ENCOUNTER — Encounter: Payer: Self-pay | Admitting: Emergency Medicine

## 2021-05-01 DIAGNOSIS — M79604 Pain in right leg: Secondary | ICD-10-CM | POA: Diagnosis not present

## 2021-05-01 DIAGNOSIS — M545 Low back pain, unspecified: Secondary | ICD-10-CM | POA: Diagnosis not present

## 2021-05-01 DIAGNOSIS — M5431 Sciatica, right side: Secondary | ICD-10-CM

## 2021-05-01 DIAGNOSIS — M5441 Lumbago with sciatica, right side: Secondary | ICD-10-CM | POA: Insufficient documentation

## 2021-05-01 DIAGNOSIS — R000 Tachycardia, unspecified: Secondary | ICD-10-CM | POA: Diagnosis not present

## 2021-05-01 MED ORDER — CYCLOBENZAPRINE HCL 10 MG PO TABS
10.0000 mg | ORAL_TABLET | Freq: Three times a day (TID) | ORAL | 0 refills | Status: DC | PRN
Start: 2021-05-01 — End: 2021-05-02

## 2021-05-01 MED ORDER — KETOROLAC TROMETHAMINE 30 MG/ML IJ SOLN
30.0000 mg | Freq: Once | INTRAMUSCULAR | Status: AC
  Administered 2021-05-01: 30 mg via INTRAMUSCULAR
  Filled 2021-05-01: qty 1

## 2021-05-01 NOTE — Discharge Instructions (Addendum)
-  Take naproxen(Aleve) and acetaminophen (tylenol) as needed for pain, as well as the prescribed cyclobenzaprine. ?-Follow-up with your primary care provider and pain clinic, as discussed. ?-Return to the emergency department anytime if you begin to experience any new or worsening symptoms. ?

## 2021-05-01 NOTE — ED Triage Notes (Signed)
Pt comes into the ED via POV c/o chronic pain and cramping in the leg down into the ankle.  Pt has an MRI scheduled for Friday.  Pt states she is working with Dr. Alba Destine with the pain clinic.  Pt currently ambulatory to triage at this time and in NAD.   ?

## 2021-05-01 NOTE — ED Provider Notes (Signed)
? ?[facility] ?Provider Note ? ? ? Event Date/Time  ? First MD Initiated Contact with Patient 05/01/21 1515   ?  (approximate) ? ? ?History  ? ?Chief Complaint ?Leg Pain ? ? ?HPI ?Kelsey Ho is a 31 y.o. female, no remarkable medical history, presents to the emergency department for evaluation of leg pain.  Patient states that she has a chronic history of low back pain with right-sided sciatica.  She states that she has a appointment scheduled for an MRI this Friday, but needs something for pain in the meantime.  She states that she is trying hard not to get drugs off the street that may be dangerous.  Denies fever/chills, chest pain, shortness of breath, abdominal pain, numbness/tingling in upper/lower extremities, saddle anesthesia, urinary symptoms, diarrhea, or nausea/vomiting. ? ?History Limitations: No limitations ? ?  ? ? ?Physical Exam  ?Triage Vital Signs: ?ED Triage Vitals  ?Enc Vitals Group  ?   BP 05/01/21 1452 129/85  ?   Pulse Rate 05/01/21 1452 (!) 130  ?   Resp 05/01/21 1452 17  ?   Temp 05/01/21 1452 99.4 ?F (37.4 ?C)  ?   Temp src --   ?   SpO2 05/01/21 1452 96 %  ?   Weight 05/01/21 1455 119 lb 14.9 oz (54.4 kg)  ?   Height 05/01/21 1455 5\' 1"  (1.549 m)  ?   Head Circumference --   ?   Peak Flow --   ?   Pain Score 05/01/21 1454 9  ?   Pain Loc --   ?   Pain Edu? --   ?   Excl. in [place]? --   ? ? ?Most recent vital signs: ?Vitals:  ? 05/01/21 1452  ?BP: 129/85  ?Pulse: (!) 130  ?Resp: 17  ?Temp: 99.4 ?F (37.4 ?C)  ?SpO2: 96%  ? ? ?General: Awake, NAD.  ?CV: Good peripheral perfusion.  ?Resp: Normal effort.  ?Abd: Soft, non-tender. No distention.  ?Neuro: At baseline. No gross neurological deficits. ?Other: Bruises scattered along her upper extremities.  Scarring appreciated along vein sites.  No gross deformities along the spine or right lower extremity.  No spinal tenderness.  Positive straight leg test in the right lower extremity ? ?Physical Exam ? ? ? ?ED Results /  Procedures / Treatments  ?Labs ?(all labs ordered are listed, but only abnormal results are displayed) ?Labs Reviewed - No data to display ? ? ?EKG ?Not applicable. ? ? ?RADIOLOGY ? ?ED Provider Interpretation: Not applicable. ? ?No results found. ? ?PROCEDURES: ? ?Critical Care performed: None. ? ?Procedures ? ? ? ?MEDICATIONS ORDERED IN ED: ?Medications  ?ketorolac (TORADOL) 30 MG/ML injection 30 mg (30 mg Intramuscular Given 05/01/21 1647)  ? ? ? ?IMPRESSION / MDM / ASSESSMENT AND PLAN / ED COURSE  ?I reviewed the triage vital signs and the nursing notes. ?             ?               ? ?Differential diagnosis includes, but is not limited to, lumbar radiculopathy, sciatica, lumbosacral strain ? ?ED Course ?Patient appears well.  NAD. notably tachycardic at 130, though patient states that it is always this high when she gets her heart rate checked in doctors offices as she gets extremely nervous.  Otherwise normal vitals. ? ? ?Assessment/Plan ?Presentation consistent with sciatica.  Patient has been in the emergency department several times before requesting medication refills.  She does  not have any new symptoms since then.  Very low suspicion for any occult serious or life-threatening pathology based on her presentation. Upon further discussion, she states that she has a primary care provider who has referred her to a pain management clinic.  She reportedly has an appointment at the end of this month.  She additionally has an MRI scheduled for this Friday for her leg.  We will treat her here with IM ketorolac and give her a prescription for cyclobenzaprine.  Encouraged her to follow-up with her PCP and the pain management clinic for help with her chronic pain.  We will discharge this patient. ? ?Patient was provided with anticipatory guidance, return precautions, and educational material. Encouraged the patient to return to the emergency department at any time if they begin to experience any new or worsening  symptoms.  ? ?  ? ? ?FINAL CLINICAL IMPRESSION(S) / ED DIAGNOSES  ? ?Final diagnoses:  ?Sciatica of right side  ? ? ? ?Rx / DC Orders  ? ?ED Discharge Orders   ? ?      Ordered  ?  cyclobenzaprine (FLEXERIL) 10 MG tablet  3 times daily PRN       ? 05/01/21 1646  ? ?  ?  ? ?  ? ? ? ?Note:  This document was prepared using Dragon voice recognition software and may include unintentional dictation errors. ?  ?Varney Daily, [place] ?05/01/21 2316 ? ?  ?Ward, Layla Maw, DO ?05/04/21 2353 ? ?

## 2021-05-02 ENCOUNTER — Emergency Department
Admission: EM | Admit: 2021-05-02 | Discharge: 2021-05-02 | Disposition: A | Discharge status: Home / Self Care | Payer: 59 | Attending: Emergency Medicine | Admitting: Emergency Medicine

## 2021-05-02 ENCOUNTER — Other Ambulatory Visit: Payer: Self-pay

## 2021-05-02 ENCOUNTER — Encounter: Payer: Self-pay | Admitting: Emergency Medicine

## 2021-05-02 ENCOUNTER — Emergency Department: Payer: 59

## 2021-05-02 DIAGNOSIS — M5416 Radiculopathy, lumbar region: Secondary | ICD-10-CM | POA: Diagnosis not present

## 2021-05-02 DIAGNOSIS — R202 Paresthesia of skin: Secondary | ICD-10-CM | POA: Diagnosis not present

## 2021-05-02 DIAGNOSIS — M545 Low back pain, unspecified: Secondary | ICD-10-CM | POA: Insufficient documentation

## 2021-05-02 DIAGNOSIS — G8929 Other chronic pain: Secondary | ICD-10-CM | POA: Insufficient documentation

## 2021-05-02 DIAGNOSIS — F419 Anxiety disorder, unspecified: Secondary | ICD-10-CM | POA: Diagnosis not present

## 2021-05-02 DIAGNOSIS — R69 Illness, unspecified: Secondary | ICD-10-CM | POA: Diagnosis not present

## 2021-05-02 LAB — URINALYSIS, ROUTINE W REFLEX MICROSCOPIC
Bilirubin Urine: NEGATIVE
Glucose, UA: NEGATIVE mg/dL
Hgb urine dipstick: NEGATIVE
Ketones, ur: 20 mg/dL — AB
Nitrite: NEGATIVE
Protein, ur: NEGATIVE mg/dL
Specific Gravity, Urine: 1.017 (ref 1.005–1.030)
pH: 6 (ref 5.0–8.0)

## 2021-05-02 LAB — POC URINE PREG, ED: Preg Test, Ur: NEGATIVE

## 2021-05-02 IMAGING — MR MR LUMBAR SPINE W/O CM
5 series · 31 of 48 positions shown · non-contrast
Comparison: Prior radiograph from [DATE].

CLINICAL DATA: Initial evaluation for lumbar radiculopathy.

EXAM:
MRI LUMBAR SPINE WITHOUT CONTRAST
TECHNIQUE: Multiplanar, multisequence MR imaging of the lumbar spine was
performed. No intravenous contrast was administered.

[Series 5: T2 · sagittal · 4.0mm · 0.81mm/px · 6 of 17 slices shown (1 of 2)]
[im 1/17]
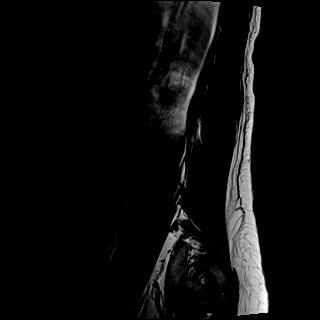
[im 4/17]
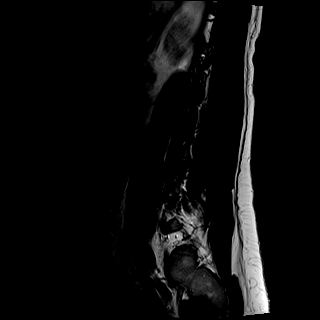
[im 7/17]
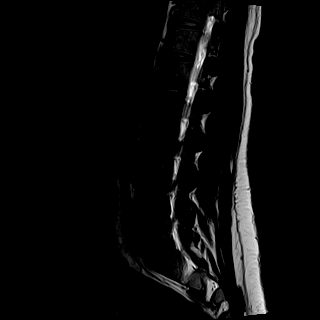
[im 10/17]
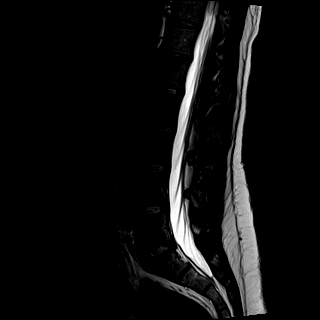
[im 13/17]
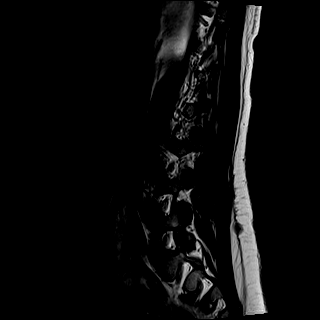
[im 17/17]
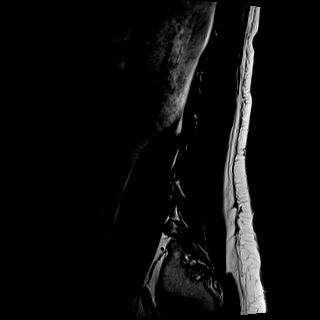

[Series 6: T1 · sagittal · 4.0mm · 0.81mm/px · 7 of 17 slices shown (1 of 2)]
[im 1/17]
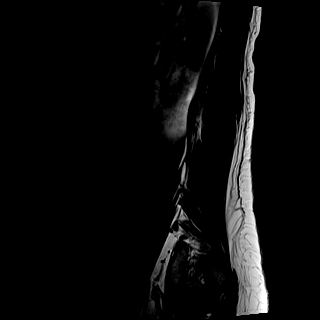
[im 3/17]
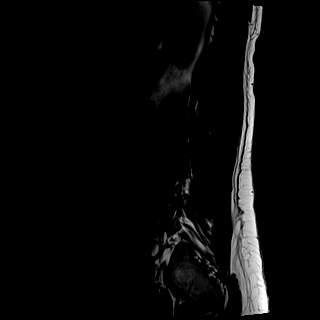
[im 6/17]
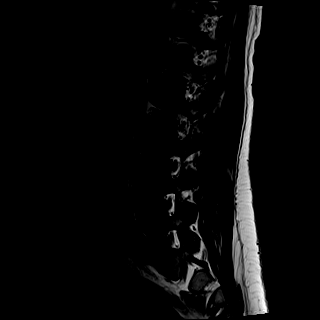
[im 9/17]
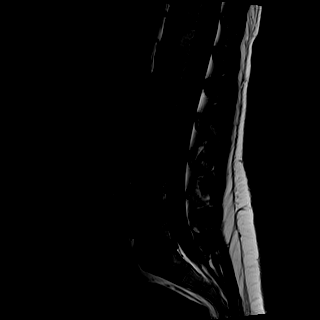
[im 11/17]
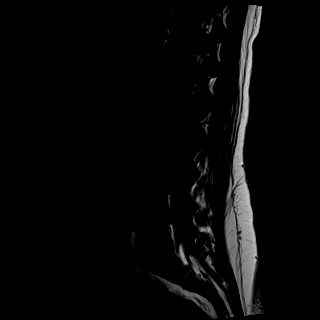
[im 14/17]
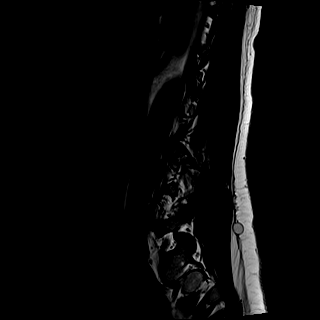
[im 17/17]
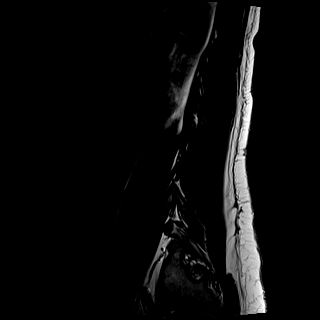

[Series 7: STIR · sagittal · 4.0mm · 0.41mm/px · 2 of 17 slices shown]
[im 1/17]
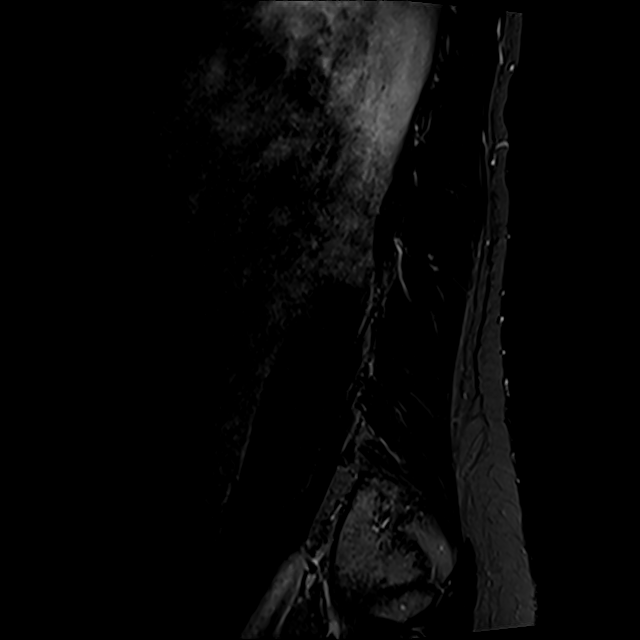
[im 3/17]
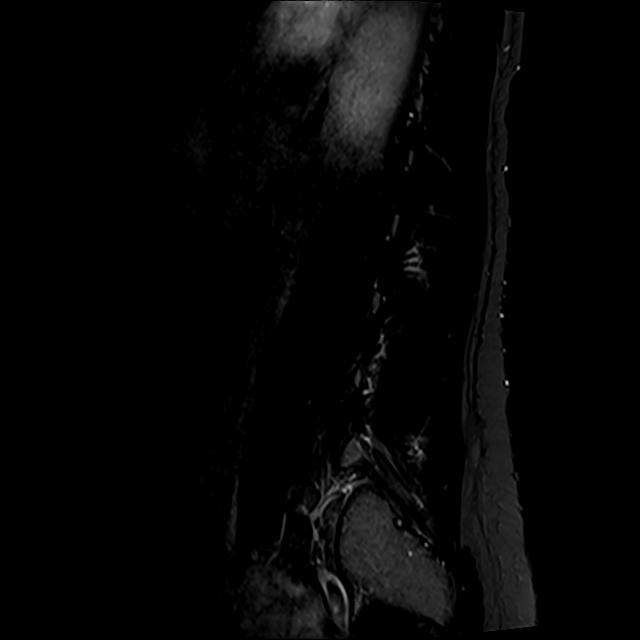

[Series 8: T2 · axial · 4.0mm · 0.78mm/px · z∈[-196,+12]mm · 8 of 36 slices shown (2 of 2)]
[im 1/36]
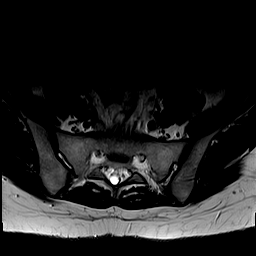
[im 6/36]
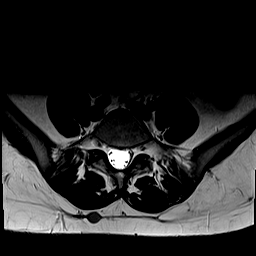
[im 11/36]
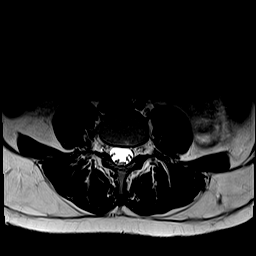
[im 17/36]
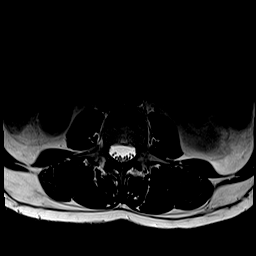
[im 19/36]
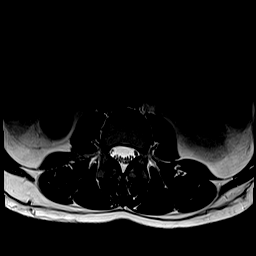
[im 25/36]
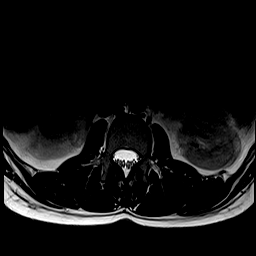
[im 30/36]
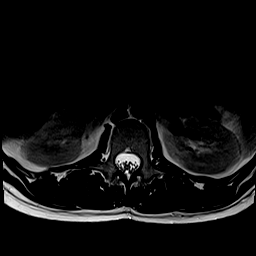
[im 36/36]
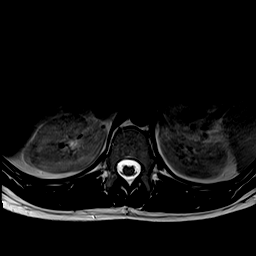

[Series 9: T1 · axial · 4.0mm · 0.39mm/px · z∈[-196,+12]mm · 8 of 36 slices shown (2 of 2)]
[im 1/36]
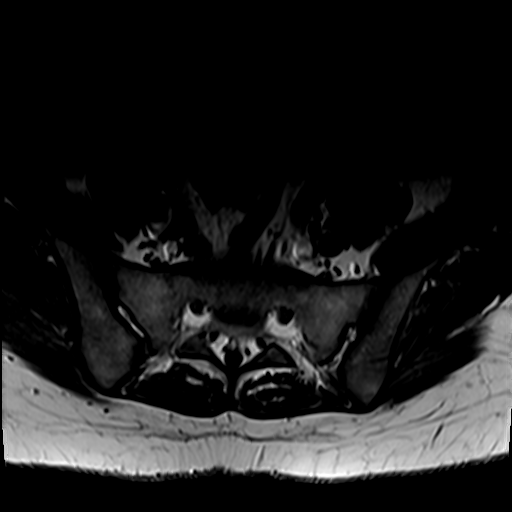
[im 6/36]
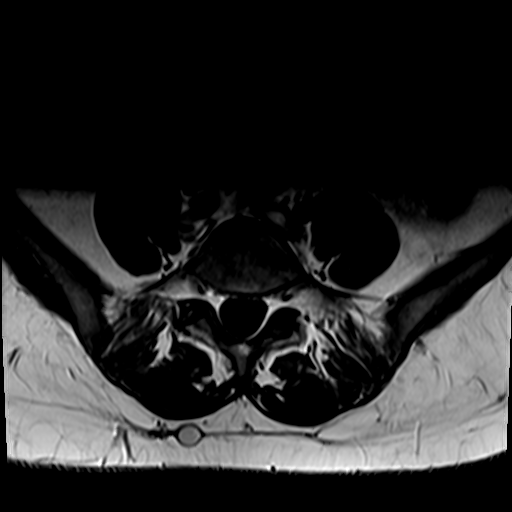
[im 11/36]
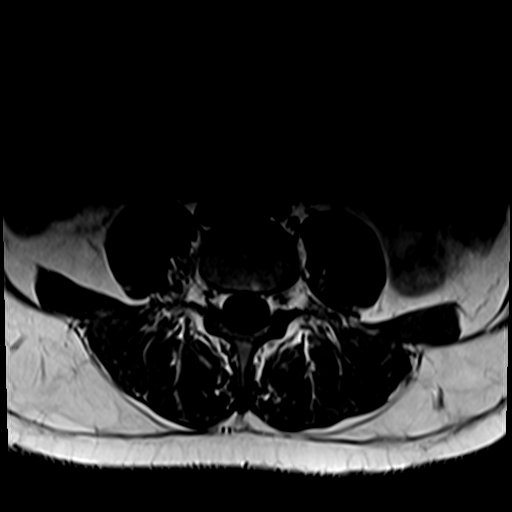
[im 17/36]
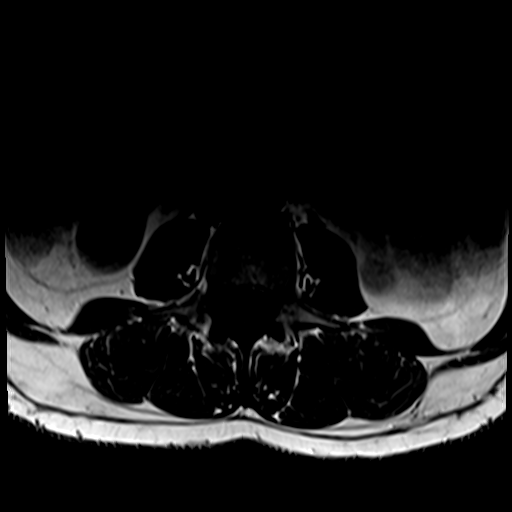
[im 19/36]
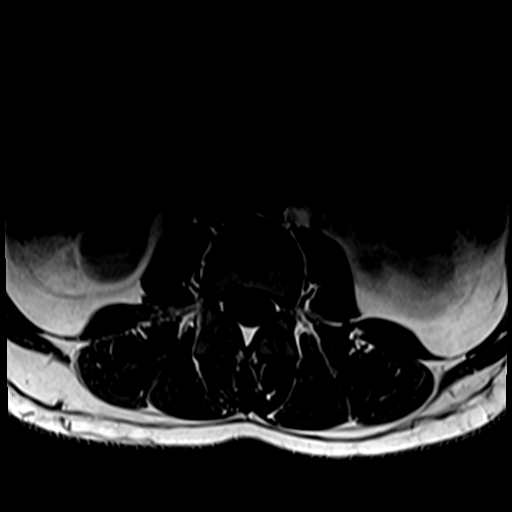
[im 25/36]
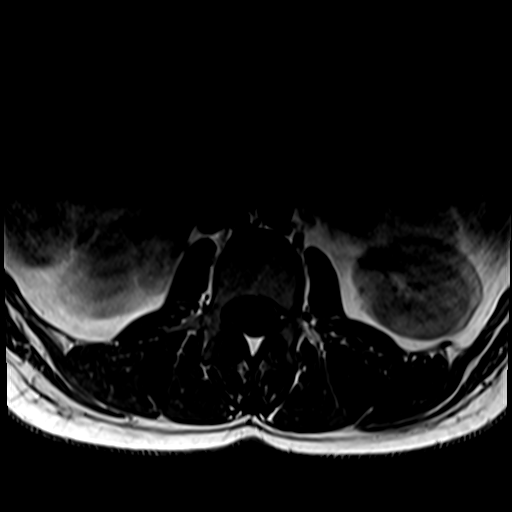
[im 30/36]
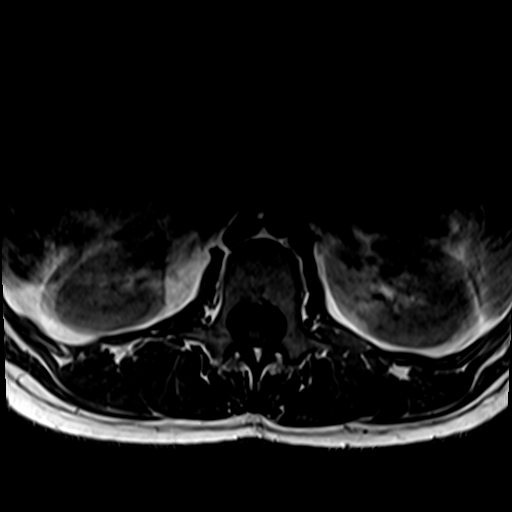
[im 36/36]
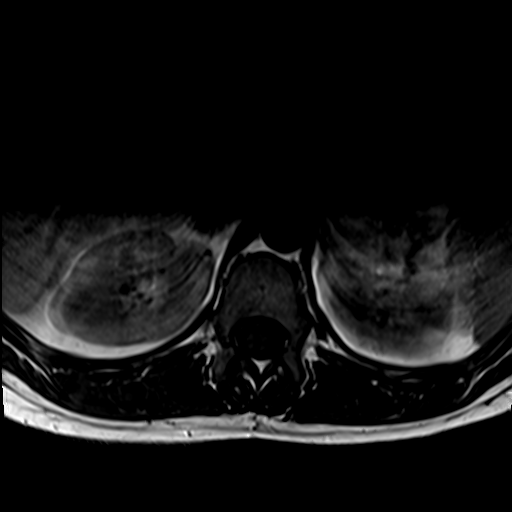

[31 of 48 positions shown; findings below may reference images not displayed]

FINDINGS: Segmentation: Standard. Lowest well-formed disc space labeled the
L5-S1 level.

Alignment: Physiologic with preservation of the normal lumbar
lordosis. No listhesis.

Vertebrae: Mild chronic height loss noted at the superior endplate
of L1. Superimposed small Schmorl's node deformity present at the
superior endplate of L1 as well. Otherwise, vertebral body height
maintained without acute fracture. Bone marrow signal intensity
within normal limits. No discrete or worrisome osseous lesions. No
abnormal marrow edema.

Conus medullaris and cauda equina: Conus extends to the L1 level.
Conus and cauda equina appear normal.

Paraspinal and other soft tissues: Paraspinous soft tissues
demonstrate no acute finding. Note made of a well-circumscribed 1 cm
T2 hypointense nodular lesion within the subcutaneous fat of the
right lower back (series 8, image 31), nonspecific, but likely
benign and of doubtful significance. Visualized visceral structures
unremarkable.

Disc levels:

No significant disc pathology seen within the lumbar spine.
Intervertebral discs are well hydrated with preserved disc height.
No disc bulge or focal disc herniation. No significant facet
pathology. No canal or neural foraminal stenosis or evidence for
neural impingement.
IMPRESSION: 1. Mild chronic compression deformity involving the superior
endplate of L1.
2. Otherwise unremarkable and normal MRI of the lumbar spine. No
significant disc pathology, stenosis, or evidence for neural
impingement.

## 2021-05-02 MED ORDER — LIDOCAINE 5 % EX PTCH
1.0000 | MEDICATED_PATCH | CUTANEOUS | Status: DC
  Administered 2021-05-02: 21:00:00 1 via TRANSDERMAL
  Filled 2021-05-02: qty 1

## 2021-05-02 MED ORDER — ALPRAZOLAM 0.25 MG PO TABS
0.2500 mg | ORAL_TABLET | Freq: Once | ORAL | Status: AC
  Administered 2021-05-02: 16:00:00 0.25 mg via ORAL
  Filled 2021-05-02: qty 1

## 2021-05-02 MED ORDER — HYDROCODONE-ACETAMINOPHEN 5-325 MG PO TABS
1.0000 | ORAL_TABLET | Freq: Once | ORAL | Status: AC
  Administered 2021-05-02: 17:00:00 1 via ORAL
  Filled 2021-05-02: qty 1

## 2021-05-02 MED ORDER — NAPROXEN 500 MG PO TABS
500.0000 mg | ORAL_TABLET | Freq: Once | ORAL | Status: AC
  Administered 2021-05-02: 21:00:00 500 mg via ORAL
  Filled 2021-05-02: qty 1

## 2021-05-02 MED ORDER — LIDOCAINE 5 % EX PTCH: 1.0000 | MEDICATED_PATCH | Freq: Two times a day (BID) | CUTANEOUS | 0 refills | Status: AC | PRN

## 2021-05-02 MED ORDER — ONDANSETRON 4 MG PO TBDP: 4.0000 mg | ORAL_TABLET | Freq: Three times a day (TID) | ORAL | 0 refills | Status: DC | PRN

## 2021-05-02 MED ORDER — ALPRAZOLAM 0.25 MG PO TABS: 0.2500 mg | ORAL_TABLET | Freq: Three times a day (TID) | ORAL | 0 refills | Status: AC | PRN

## 2021-05-02 MED ORDER — CYCLOBENZAPRINE HCL 10 MG PO TABS: 10.0000 mg | ORAL_TABLET | Freq: Three times a day (TID) | ORAL | 0 refills | Status: AC | PRN

## 2021-05-02 MED ORDER — LORAZEPAM 1 MG PO TABS
1.0000 mg | ORAL_TABLET | ORAL | Status: AC
  Administered 2021-05-02: 19:00:00 1 mg via ORAL
  Filled 2021-05-02: qty 1

## 2021-05-02 MED ORDER — CYCLOBENZAPRINE HCL 10 MG PO TABS
10.0000 mg | ORAL_TABLET | Freq: Once | ORAL | Status: AC
  Administered 2021-05-02: 21:00:00 10 mg via ORAL
  Filled 2021-05-02: qty 1

## 2021-05-02 MED ORDER — GABAPENTIN 300 MG PO CAPS: 300.0000 mg | ORAL_CAPSULE | Freq: Three times a day (TID) | ORAL | 0 refills | Status: DC

## 2021-05-02 MED ORDER — ONDANSETRON 4 MG PO TBDP
4.0000 mg | ORAL_TABLET | Freq: Once | ORAL | Status: AC
  Administered 2021-05-02: 16:00:00 4 mg via ORAL
  Filled 2021-05-02: qty 1

## 2021-05-02 MED ORDER — NABUMETONE 750 MG PO TABS: 750.0000 mg | ORAL_TABLET | Freq: Two times a day (BID) | ORAL | 0 refills | Status: AC

## 2021-05-02 NOTE — ED Triage Notes (Signed)
Pt via POV from home. Pt c/o back pain, pt has chronic back pain. States she was here yesterday. Pt was given Tramadol and muscles relaxer yesterday. States they were prescribed but they have not helped. Pt has a MRI scheduled for tomorrow. Pt is A&OX4 and NAD ?

## 2021-05-02 NOTE — ED Provider Notes (Signed)
? ? ?[facility] ?Emergency Department Provider Note ? ? ? ? Event Date/Time  ? First MD Initiated Contact with Patient 05/02/21 1338   ?  (approximate) ? ? ?History  ? ?Back Pain ? ? ?HPI ? ?Kelsey Ho is a 31 y.o. female with a history of chronic low back pain and a history of an H&P presents to the ED.  This is the patient's second visit in 2 days for the same complaint.  She reports limited benefit with medications provided yesterday.  She recently transition from [place], and has recently established healthcare coverage.  Patient was scheduled for an outpatient, self-pay MRI for tomorrow, but presents today with increasing pain, paresthesias, and radicular symptoms.  She denies any bladder or bowel incontinence, foot drop, or saddle anesthesias.  She does endorse bilateral foot paresthesia that she describes as numbness, as well as pain from the midline low back down the bilateral lower extremities terminating at the knees.  She denies any recent injury, dysuria, trauma, or falls. Patient with a history of prior chronic pain management with opioid treatment. She is not currently under the care of pain management, but chart review details attempts to get prior authorization for imaging prior to assignment. The patient did endorse thoughts/intentions/desire to secure narcotics "off the street" because of the pain. Patient with generalized complaints like poor sleep, poor appetite, palpitations, weight loss, headaches, cold sensation, that she attributes to her anxiety. ? ? ?Physical Exam  ? ?Triage Vital Signs: ?ED Triage Vitals  ?Enc Vitals Group  ?   BP 05/02/21 1308 (!) 143/99  ?   Pulse Rate 05/02/21 1308 (!) 115  ?   Resp 05/02/21 1308 20  ?   Temp 05/02/21 1308 (!) 97.5 ?F (36.4 ?C)  ?   Temp Source 05/02/21 1308 Oral  ?   SpO2 05/02/21 1308 99 %  ?   Weight 05/02/21 1306 115 lb (52.2 kg)  ?   Height 05/02/21 1306 5\' 1"  (1.549 m)  ?   Head Circumference --   ?   Peak Flow --   ?    Pain Score 05/02/21 1306 10  ?   Pain Loc --   ?   Pain Edu? --   ?   Excl. in [place]? --   ? ? ?Most recent vital signs: ?Vitals:  ? 05/02/21 1349 05/02/21 1948  ?BP: 135/88 (!) 131/93  ?Pulse:  99  ?Resp:  20  ?Temp:    ?SpO2:  98%  ? ? ?General Awake, no distress.  ?HEENT NCAT. PERRL. EOMI. No rhinorrhea. Mucous membranes are moist.  ?CV:  Good peripheral perfusion. Tachy rate ?RESP:  Normal effort. CTA ?ABD:  No distention. soft ?MSK:  Normal spinal alignment without midline tenderness, spasm, deformity, or step-off. Normal spinal ROM. Normal LE ROM without deficit. Normal lumbar flexion and extension ROM demonstrated.  ?NEURO: CN II-XII grossly intact. Normal LE DTRs bilaterally ?PSYCH: Anxious ? ?ED Results / Procedures / Treatments  ? ?Labs ?(all labs ordered are listed, but only abnormal results are displayed) ?Labs Reviewed  ?URINALYSIS, ROUTINE W REFLEX MICROSCOPIC - Abnormal; Notable for the following components:  ?    Result Value  ? Color, Urine YELLOW (*)   ? APPearance CLOUDY (*)   ? Ketones, ur 20 (*)   ? Leukocytes,Ua LARGE (*)   ? Bacteria, UA MANY (*)   ? All other components within normal limits  ?POC URINE PREG, ED  ? ? ? ?EKG ? ?RADIOLOGY ? ?  I personally viewed and evaluated these images as part of my medical decision making, as well as reviewing the written report by the radiologist. ? ?ED Provider Interpretation: no acute findings} ? ?MRI Lumbar Spine ? ?IMPRESSION: ?1. Mild chronic compression deformity involving the superior ?endplate of L1. ?2. Otherwise unremarkable and normal MRI of the lumbar spine. No ?significant disc pathology, stenosis, or evidence for neural ?impingement. ? ? ?PROCEDURES: ? ?Critical Care performed: No ? ?Procedures ? ? ?MEDICATIONS ORDERED IN ED: ?Medications  ?LORazepam (ATIVAN) tablet 1 mg (1 mg Oral Given 05/02/21 1856)  ?ALPRAZolam Duanne Moron) tablet 0.25 mg (0.25 mg Oral Given 05/02/21 1619)  ?ondansetron (ZOFRAN-ODT) disintegrating tablet 4 mg (4 mg Oral Given 05/02/21  1620)  ?HYDROcodone-acetaminophen (NORCO/VICODIN) 5-325 MG per tablet 1 tablet (1 tablet Oral Given 05/02/21 1709)  ?cyclobenzaprine (FLEXERIL) tablet 10 mg (10 mg Oral Given 05/02/21 2125)  ?naproxen (NAPROSYN) tablet 500 mg (500 mg Oral Given 05/02/21 2125)  ? ? ? ?IMPRESSION / MDM / ASSESSMENT AND PLAN / ED COURSE  ?I reviewed the triage vital signs and the nursing notes. ?             ?               ? ?Differential diagnosis includes, but is not limited to, lumbar strain, lumbar radiculopathy, HNP, UTI, opioid dependence/use disorder ? ?Patient to the ED with a history of chronic back pain endorsing persistent midline low back pain.  Patient is evaluated for complaints in the ED, and found to have a reassuring work-up.  She does have an MRI performed emergently, which is negative for any acute findings including acute HNP or nerve impingement.  Patient's prior MRI from 2020 is available only by report on the patient's cell phone which I did review.  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  Patient's diagnosis is consistent with chronic low back pain but unclear etiology. Patient will be discharged home with prescriptions for gabapentin, Flexeril, naproxen, Lidoderm patches, Zofran, and a small prescription for Xanax for anxiety. Patient is to follow up with her primary provider as needed or otherwise directed.  Of also discussed at length with the patient and her mother at bedside, about the patient's pain without current MRI findings.  Patient is at high risk for relapse to opioid misuse, and likely is a good candidate for outpatient substance abuse counseling.  Patient is given referral information to [facility] for ongoing management.  Patient is given ED precautions to return to the ED for any worsening or new symptoms. ? ? ?FINAL CLINICAL IMPRESSION(S) / ED DIAGNOSES  ? ?Final diagnoses:  ?Chronic low back pain without  sciatica, unspecified back pain laterality  ?Anxiety  ? ? ? ?Rx / DC Orders  ? ?ED Discharge Orders   ? ?      Ordered  ?  lidocaine (LIDODERM) 5 %  Every 12 hours PRN       ? 05/02/21 2047  ?  ALPRAZolam (XANAX) 0.25 MG tablet  3 times daily PRN       ? 05/02/21 2047  ?  gabapentin (NEURONTIN) 300 MG capsule  3 times daily       ? 05/02/21 2047  ?  nabumetone (RELAFEN) 750 MG tablet  2 times daily       ? 05/02/21 2047  ?  cyclobenzaprine (FLEXERIL) 10 MG tablet  3 times daily PRN       ?  05/02/21 2047  ?  ondansetron (ZOFRAN-ODT) 4 MG disintegrating tablet  Every 8 hours PRN       ? 05/02/21 2047  ? ?  ?  ? ?  ? ? ? ?Note:  This document was prepared using Dragon voice recognition software and may include unintentional dictation errors. ? ?  ?Melvenia Needles, PA-C ?05/05/21 1143 ? ?  ?Duffy Bruce, MD ?05/05/21 1654 ? ?

## 2021-05-02 NOTE — Discharge Instructions (Addendum)
Your MRI does not show any signs of an disc herniation and/or nerve impingement. Be sure to follow-up with your primary provider for further evaluation and pain management. Take the prescription meds as directed. Consider outpatient counseling with a behavioral medicine specialist.  ?

## 2021-05-02 NOTE — ED Notes (Signed)
Pt returned from MRI °

## 2021-05-07 DIAGNOSIS — M5442 Lumbago with sciatica, left side: Secondary | ICD-10-CM | POA: Diagnosis not present

## 2021-05-07 DIAGNOSIS — M5441 Lumbago with sciatica, right side: Secondary | ICD-10-CM | POA: Diagnosis not present

## 2021-05-07 DIAGNOSIS — G8929 Other chronic pain: Secondary | ICD-10-CM | POA: Diagnosis not present

## 2021-05-13 DIAGNOSIS — Z23 Encounter for immunization: Secondary | ICD-10-CM | POA: Diagnosis not present

## 2021-05-13 DIAGNOSIS — R200 Anesthesia of skin: Secondary | ICD-10-CM | POA: Diagnosis not present

## 2021-05-13 DIAGNOSIS — M79604 Pain in right leg: Secondary | ICD-10-CM | POA: Diagnosis not present

## 2021-05-13 DIAGNOSIS — M5442 Lumbago with sciatica, left side: Secondary | ICD-10-CM | POA: Diagnosis not present

## 2021-05-15 ENCOUNTER — Other Ambulatory Visit: Payer: Self-pay

## 2021-05-15 ENCOUNTER — Emergency Department
Admission: EM | Admit: 2021-05-15 | Discharge: 2021-05-15 | Disposition: A | Discharge status: Home / Self Care | Payer: 59 | Attending: Emergency Medicine | Admitting: Emergency Medicine

## 2021-05-15 ENCOUNTER — Encounter: Payer: Self-pay | Admitting: Emergency Medicine

## 2021-05-15 DIAGNOSIS — M545 Low back pain, unspecified: Secondary | ICD-10-CM | POA: Insufficient documentation

## 2021-05-15 DIAGNOSIS — F419 Anxiety disorder, unspecified: Secondary | ICD-10-CM | POA: Diagnosis present

## 2021-05-15 DIAGNOSIS — G8929 Other chronic pain: Secondary | ICD-10-CM | POA: Diagnosis not present

## 2021-05-15 DIAGNOSIS — R569 Unspecified convulsions: Secondary | ICD-10-CM | POA: Diagnosis not present

## 2021-05-15 DIAGNOSIS — R69 Illness, unspecified: Secondary | ICD-10-CM | POA: Diagnosis not present

## 2021-05-15 LAB — CBC WITH DIFFERENTIAL/PLATELET
Abs Immature Granulocytes: 0.04 10*3/uL (ref 0.00–0.07)
Basophils Absolute: 0.1 10*3/uL (ref 0.0–0.1)
Basophils Relative: 1 %
Eosinophils Absolute: 0.2 10*3/uL (ref 0.0–0.5)
Eosinophils Relative: 2 %
HCT: 47.9 % — ABNORMAL HIGH (ref 36.0–46.0)
Hemoglobin: 15.2 g/dL — ABNORMAL HIGH (ref 12.0–15.0)
Immature Granulocytes: 0 %
Lymphocytes Relative: 18 %
Lymphs Abs: 2.1 10*3/uL (ref 0.7–4.0)
MCH: 28.4 pg (ref 26.0–34.0)
MCHC: 31.7 g/dL (ref 30.0–36.0)
MCV: 89.5 fL (ref 80.0–100.0)
Monocytes Absolute: 0.7 10*3/uL (ref 0.1–1.0)
Monocytes Relative: 6 %
Neutro Abs: 8.5 10*3/uL — ABNORMAL HIGH (ref 1.7–7.7)
Neutrophils Relative %: 73 %
Platelets: 489 10*3/uL — ABNORMAL HIGH (ref 150–400)
RBC: 5.35 MIL/uL — ABNORMAL HIGH (ref 3.87–5.11)
RDW: 13.2 % (ref 11.5–15.5)
WBC: 11.7 10*3/uL — ABNORMAL HIGH (ref 4.0–10.5)
nRBC: 0 % (ref 0.0–0.2)

## 2021-05-15 LAB — COMPREHENSIVE METABOLIC PANEL
ALT: 17 U/L (ref 0–44)
AST: 24 U/L (ref 15–41)
Albumin: 4.5 g/dL (ref 3.5–5.0)
Alkaline Phosphatase: 67 U/L (ref 38–126)
Anion gap: 10 (ref 5–15)
BUN: 10 mg/dL (ref 6–20)
CO2: 25 mmol/L (ref 22–32)
Calcium: 9.9 mg/dL (ref 8.9–10.3)
Chloride: 104 mmol/L (ref 98–111)
Creatinine, Ser: 0.74 mg/dL (ref 0.44–1.00)
GFR, Estimated: >60 mL/min (ref >60)
Glucose, Bld: 111 mg/dL — ABNORMAL HIGH (ref 70–99)
Potassium: 3.8 mmol/L (ref 3.5–5.1)
Sodium: 139 mmol/L (ref 135–145)
Total Bilirubin: 0.6 mg/dL (ref 0.3–1.2)
Total Protein: 8.4 g/dL — ABNORMAL HIGH (ref 6.5–8.1)

## 2021-05-15 MED ORDER — HYDROXYZINE HCL 25 MG PO TABS: 25.0000 mg | ORAL_TABLET | Freq: Three times a day (TID) | ORAL | 0 refills | Status: DC | PRN

## 2021-05-15 MED ORDER — HYDROXYZINE HCL 25 MG PO TABS
25.0000 mg | ORAL_TABLET | Freq: Once | ORAL | Status: AC
  Administered 2021-05-15: 25 mg via ORAL
  Filled 2021-05-15: qty 1

## 2021-05-15 NOTE — ED Triage Notes (Signed)
Pt comes into the ED via POV c/o generalized back pain and increased anxiety.  Pt has been set up with the pain clinic, but she states they prescribed her antidepressants and her pain is not related to this.  Pt also explains that she had a seizure last week.  Pt currently in NAD with even and unlabored respirations.  ?

## 2021-05-15 NOTE — ED Notes (Signed)
Pt seen rushing out to lobby s/p speaking with another [employer] fundraiser. Mother states she has been unstable. This RN follows pt to lobby to speak with her. Pt denies any kind of SI/HI, only stating that "the system here is fucked up" and that she cant see the kind of doctor she needs. Pt eventually agrees to come back to be evaluated by the provider ?

## 2021-05-15 NOTE — ED Notes (Signed)
ED Provider at bedside. 

## 2021-05-15 NOTE — ED Provider Notes (Signed)
? ?[facility] ?Provider Note ? ? ? Event Date/Time  ? First MD Initiated Contact with Patient 05/15/21 1259   ?  (approximate) ? ? ?History  ? ?Pain, Anxiety, and Seizures ? ? ?HPI ? ?Modest Draeger is a 31 y.o. female who presents to the ED for evaluation of Pain, Anxiety, and Seizures ?  ?I review ED visit from 3/9 where patient was seen for chronic low back pain.  Ended up getting MRI of her lumbar back.  Mild chronic compression deformity to the superior endplate of L1, but otherwise unremarkable without stenosis or cord compression. ?I review outpatient PMR visit from 3/14 and an outpatient neuro visit from 3/20. ? ?Patient presents to the ED for evaluation of anxiety.  She tells me at length about her anxiety, her back pain, recently coming here from [place] and not liking the medical system here.  She reports that she used to be given a lot of benzodiazepines by a provider in [place], but they have since been arrested for inappropriate prescribing.  She is requesting a prescription for something for her anxiety. ? ?I offered to have psychiatry come see the patient voluntarily for evaluation of her mental health, but she declines, reporting that she is not crazy.  Denies SI, HI or AVH. ?Her parents eventually come to the bedside and patient continues to talk about how she does not like her current life and should not have to live like this.  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  She has difficulty telling me what I can help her with today. ? ?Physical Exam  ? ?Triage Vital Signs: ?ED Triage Vitals  ?Enc Vitals Group  ?   BP 05/15/21 1251 (!) 158/114  ?   Pulse Rate 05/15/21 1251 (!) 108  ?   Resp 05/15/21 1251 20  ?   Temp 05/15/21 1251 98.5 ?F (36.9 ?C)  ?   Temp Source 05/15/21 1251 Oral  ?   SpO2 05/15/21 1251 99 %  ?   Weight  05/15/21 1248 115 lb 1.3 oz (52.2 kg)  ?   Height 05/15/21 1248 5\' 1"  (1.549 m)  ?   Head Circumference --   ?   Peak Flow --   ?   Pain Score 05/15/21 1248 7  ?   Pain Loc --   ?   Pain Edu? --   ?   Excl. in GC? --   ? ? ?Most recent vital signs: ?Vitals:  ? 05/15/21 1251  ?BP: (!) 158/114  ?Pulse: (!) 108  ?Resp: 20  ?Temp: 98.5 ?F (36.9 ?C)  ?SpO2: 99%  ? ? ?General: Awake, no distress.  Ambulatory in the room, the hallway and to and from the waiting room with a brisk and normal gait. ?CV:  Good peripheral perfusion.  ?Resp:  Normal effort.  ?Abd:  No distention.  ?MSK:  No deformity noted.  ?Neuro:  No focal deficits appreciated. Cranial nerves II through XII intact ?5/5 strength and sensation in all 4 extremities ?Other:   ? ? ?ED Results / Procedures / Treatments  ? ?Labs ?(all labs ordered are listed, but only abnormal results are displayed) ?Labs Reviewed  ?COMPREHENSIVE METABOLIC PANEL - Abnormal; Notable for the following components:  ?    Result Value  ? Glucose, Bld 111 (*)   ?  Total Protein 8.4 (*)   ? All other components within normal limits  ?CBC WITH DIFFERENTIAL/PLATELET - Abnormal; Notable for the following components:  ? WBC 11.7 (*)   ? RBC 5.35 (*)   ? Hemoglobin 15.2 (*)   ? HCT 47.9 (*)   ? Platelets 489 (*)   ? Neutro Abs 8.5 (*)   ? All other components within normal limits  ?URINALYSIS, ROUTINE W REFLEX MICROSCOPIC  ?POC URINE PREG, ED  ? ? ?EKG ? ? ?RADIOLOGY ? ? ?Official radiology report(s): ?No results found. ? ?PROCEDURES and INTERVENTIONS: ? ?Procedures ? ?Medications  ?hydrOXYzine (ATARAX) tablet 25 mg (25 mg Oral Given 05/15/21 1402)  ? ? ? ?IMPRESSION / MDM / ASSESSMENT AND PLAN / ED COURSE  ?I reviewed the triage vital signs and the nursing notes. ? ?31 year old female presents to the ED with anxiety suitable for outpatient management with close [facility] follow-up.  She is no evidence of psychiatric emergency to require IVC or emergent evaluation by psychiatry, but offered to have  psychiatry see the patient she declines on multiple occasions.  I suspect that she was requesting benzodiazepines primarily, but she dances around that point.  No concerns for traumatic back pain to require imaging or any cauda equina syndrome.  She relates out of the ED in no acute distress.  On multiple occasions to tell her about [facility] health services and to reach out to them for counseling and help with her mental health.  We discussed appropriate return precautions for the ED. ? ?  ? ? ?FINAL CLINICAL IMPRESSION(S) / ED DIAGNOSES  ? ?Final diagnoses:  ?Anxiety  ? ? ? ?Rx / DC Orders  ? ?ED Discharge Orders   ? ?      Ordered  ?  hydrOXYzine (ATARAX) 25 MG tablet  3 times daily PRN       ? 05/15/21 1353  ? ?  ?  ? ?  ? ? ? ?Note:  This document was prepared using Dragon voice recognition software and may include unintentional dictation errors. ?  ?Delton Prairie, MD ?05/15/21 1546 ? ?

## 2021-05-16 DIAGNOSIS — R200 Anesthesia of skin: Secondary | ICD-10-CM | POA: Insufficient documentation

## 2021-05-29 DIAGNOSIS — Z5181 Encounter for therapeutic drug level monitoring: Secondary | ICD-10-CM | POA: Diagnosis not present

## 2021-05-29 DIAGNOSIS — G894 Chronic pain syndrome: Secondary | ICD-10-CM | POA: Diagnosis not present

## 2021-05-29 DIAGNOSIS — Z79899 Other long term (current) drug therapy: Secondary | ICD-10-CM | POA: Diagnosis not present

## 2021-05-29 DIAGNOSIS — M5136 Other intervertebral disc degeneration, lumbar region: Secondary | ICD-10-CM | POA: Diagnosis not present

## 2021-05-29 DIAGNOSIS — F3341 Major depressive disorder, recurrent, in partial remission: Secondary | ICD-10-CM | POA: Diagnosis not present

## 2021-05-29 DIAGNOSIS — R69 Illness, unspecified: Secondary | ICD-10-CM | POA: Diagnosis not present

## 2021-06-03 DIAGNOSIS — M79604 Pain in right leg: Secondary | ICD-10-CM | POA: Diagnosis not present

## 2021-06-03 DIAGNOSIS — R200 Anesthesia of skin: Secondary | ICD-10-CM | POA: Diagnosis not present

## 2021-06-03 DIAGNOSIS — M5442 Lumbago with sciatica, left side: Secondary | ICD-10-CM | POA: Diagnosis not present

## 2021-06-03 DIAGNOSIS — R519 Headache, unspecified: Secondary | ICD-10-CM | POA: Diagnosis not present

## 2021-06-11 ENCOUNTER — Telehealth: Payer: 59 | Admitting: Emergency Medicine

## 2021-06-11 ENCOUNTER — Other Ambulatory Visit: Payer: Self-pay

## 2021-06-11 ENCOUNTER — Emergency Department
Admission: EM | Admit: 2021-06-11 | Discharge: 2021-06-11 | Disposition: A | Discharge status: Home / Self Care | Payer: 59 | Attending: Emergency Medicine | Admitting: Emergency Medicine

## 2021-06-11 DIAGNOSIS — G4700 Insomnia, unspecified: Secondary | ICD-10-CM | POA: Diagnosis not present

## 2021-06-11 DIAGNOSIS — F419 Anxiety disorder, unspecified: Secondary | ICD-10-CM | POA: Diagnosis not present

## 2021-06-11 DIAGNOSIS — Z760 Encounter for issue of repeat prescription: Secondary | ICD-10-CM | POA: Insufficient documentation

## 2021-06-11 DIAGNOSIS — R69 Illness, unspecified: Secondary | ICD-10-CM | POA: Diagnosis not present

## 2021-06-11 DIAGNOSIS — G8929 Other chronic pain: Secondary | ICD-10-CM | POA: Diagnosis not present

## 2021-06-11 MED ORDER — HYDROXYZINE HCL 25 MG PO TABS: 25.0000 mg | ORAL_TABLET | Freq: Three times a day (TID) | ORAL | 0 refills | Status: DC | PRN

## 2021-06-11 NOTE — ED Provider Notes (Signed)
? ?[facility] ?Provider Note ? ? ? Event Date/Time  ? First MD Initiated Contact with Patient 06/11/21 1143   ?  (approximate) ? ? ?History  ? ?Anxiety ? ? ?HPI ? ?Zuleyka Manner is a 31 y.o. female who presents to the ED for evaluation of Anxiety ?  ?I reviewed outpatient neurology visit from 4/10 where patient was evaluated for chronic limb pain, anxiety and difficulty sleeping. ? ?Patient presents to the ED requesting a refill for her Xanax prescription due to acute on chronic anxiety.  She reports running out of her 0.25 mg Xanax as this morning and asked for refill.  When I informed her that that is an appropriate use of the emergency department, she then requests a muscle relaxer or something stronger than hydroxyzine.  She reports using hydroxyzine as needed for anxiety " and it helps, but its not the same." ? ?She denies any suicidal intent, homicidality or AV hallucinations.  When I saw her a few weeks ago I urged her to reach out to [place] services, and she reports that she has not done this yet.  I urged her to do so. ? ?Physical Exam  ? ?Triage Vital Signs: ?ED Triage Vitals [06/11/21 1137]  ?Enc Vitals Group  ?   BP (!) 150/118  ?   Pulse Rate (!) 115  ?   Resp 20  ?   Temp 98.2 ?F (36.8 ?C)  ?   Temp Source Oral  ?   SpO2 100 %  ?   Weight 115 lb (52.2 kg)  ?   Height 5\' 1"  (1.549 m)  ?   Head Circumference   ?   Peak Flow   ?   Pain Score 3  ?   Pain Loc   ?   Pain Edu?   ?   Excl. in [place]?   ? ? ?Most recent vital signs: ?Vitals:  ? 06/11/21 1137  ?BP: (!) 150/118  ?Pulse: (!) 115  ?Resp: 20  ?Temp: 98.2 ?F (36.8 ?C)  ?SpO2: 100%  ? ? ?General: Awake, no distress.  Ambulatory with normal gait.  Linear thought processes and is conversational. ?CV:  Good peripheral perfusion.  ?Resp:  Normal effort.  ?Abd:  No distention.  ?MSK:  No deformity noted.  ?Neuro:  No focal deficits appreciated. Cranial nerves II through XII intact ?5/5 strength and sensation in all 4  extremities ?Other:   ? ? ?ED Results / Procedures / Treatments  ? ?Labs ?(all labs ordered are listed, but only abnormal results are displayed) ?Labs Reviewed - No data to display ? ?EKG ? ? ?RADIOLOGY ? ? ?Official radiology report(s): ?No results found. ? ?PROCEDURES and INTERVENTIONS: ? ?Procedures ? ?Medications - No data to display ? ? ?IMPRESSION / MDM / ASSESSMENT AND PLAN / ED COURSE  ?I reviewed the triage vital signs and the nursing notes. ? ?31 year old female presents to the ED with chronic anxiety requesting prescription for benzodiazepines, suitable for outpatient management with [facility] services follow-up and hydroxyzine prescription.  I see no evidence of psychiatric emergency to require IVC or emergent psychiatric referral.  She is pleasant, conversational and ambulatory with a normal gait.  No indications for diagnostics.  I urged her to call [facility] and she said that she would this afternoon.  We discussed appropriate return precautions for the ED. ? ?  ? ? ?FINAL CLINICAL IMPRESSION(S) / ED DIAGNOSES  ? ?Final diagnoses:  ?Anxiety  ? ? ? ?Rx /  DC Orders  ? ?ED Discharge Orders   ? ?      Ordered  ?  hydrOXYzine (ATARAX) 25 MG tablet  3 times daily PRN       ? 06/11/21 1204  ? ?  ?  ? ?  ? ? ? ?Note:  This document was prepared using Dragon voice recognition software and may include unintentional dictation errors. ?  ?Vladimir Crofts, MD ?06/11/21 1209 ? ?

## 2021-06-11 NOTE — ED Triage Notes (Signed)
Pt brought in for anxiety states takes xanax and has 1 pill left. [facility] does not have a therapist and wants outpatient info. [facility]. Pt denies any SI or HI or wanting inpatietn admission.  ?

## 2021-06-11 NOTE — Progress Notes (Addendum)
?Virtual Visit Consent  ? ?Kelsey Ho, you are scheduled for a virtual visit with a [facility] provider today.   ?  ?Just as with appointments in the office, your consent must be obtained to participate.  Your consent will be active for this visit and any virtual visit you may have with one of our providers in the next 365 days.   ?  ?If you have a MyChart account, a copy of this consent can be sent to you electronically.  All virtual visits are billed to your insurance company just like a traditional visit in the office.   ? ?As this is a virtual visit, video technology does not allow for your provider to perform a traditional examination.  This may limit your provider's ability to fully assess your condition.  If your provider identifies any concerns that need to be evaluated in person or the need to arrange testing (such as labs, EKG, etc.), we will make arrangements to do so.   ?  ?Although advances in technology are sophisticated, we cannot ensure that it will always work on either your end or our end.  If the connection with a video visit is poor, the visit may have to be switched to a telephone visit.  With either a video or telephone visit, we are not always able to ensure that we have a secure connection.    ? ?I need to obtain your verbal consent now.   Are you willing to proceed with your visit today?  ?  ?Kelsey Ho has provided verbal consent on 06/11/2021 for a virtual visit (video or telephone). ?  ?Kelsey Getting, NP  ? ?Date: 06/11/2021 8:41 AM ? ? ?Virtual Visit via Video Note  ? ?Kelsey Ho, connected with  Kelsey Ho  ([id number], September 04, 1990) on 06/11/21 at  7:45 AM EDT by a video-enabled telemedicine application and verified that I am speaking with the correct person using two identifiers. ? ?Location: ?Patient: Virtual Visit Location Patient: Home ?Provider: Virtual Visit Location Provider: Home Office ?  ?I discussed the limitations of evaluation and management by telemedicine and  the availability of in person appointments. The patient expressed understanding and agreed to proceed.   ? ?History of Present Illness: ?Kelsey Ho is a 31 y.o. who identifies as a female who was assigned female at birth, and is being seen today for Medication refills. She reports she moved here recently from [place] where she was regularly prescribed pain medication, insomnia medication, and xanax. Is frustrated because new providers here aren't prescribing her old medications and the ones she is being given aren't helping her chronic pain. She does not have mental health appt until November. Did try going to [facility] but left because wait was 2.5 hours. Is seeing neurology for her chronic pain issues. Denies acute needs or changes. Just looking for rx for prior medications from [facility]. She reports she left [place] to be closer to her parents in [place] after her doctor in [place] was arrested.  ? ?HPI: HPI  ?Problems: There are no problems to display for this patient. ?  ?Allergies: No Known Allergies ?Medications:  ?Current Outpatient Medications:  ?  gabapentin (NEURONTIN) 300 MG capsule, Take 1 capsule (300 mg total) by mouth 3 (three) times daily., Disp: 90 capsule, Rfl: 0 ?  hydrOXYzine (ATARAX) 25 MG tablet, Take 1 tablet (25 mg total) by mouth 3 (three) times daily as needed., Disp: 30 tablet, Rfl: 0 ?  ondansetron (ZOFRAN-ODT) 4  MG disintegrating tablet, Take 1 tablet (4 mg total) by mouth every 8 (eight) hours as needed for nausea or vomiting., Disp: 15 tablet, Rfl: 0 ? ?Observations/Objective: ?Patient is well-developed, well-nourished in no acute distress.  ?Resting comfortably  at home.  ?Head is normocephalic, atraumatic.  ?No labored breathing.  ?Speech is clear and coherent with logical content.  ?Patient is alert and oriented at baseline.  ? ? ?Assessment and Plan: ?1. Other chronic pain ? ?2. Anxiety ? ?3. Insomnia, unspecified type ? ?Encouraged pt to f/u with current  providers for adjustments to treatment plan/meds if current plan isn't working for her. Encouraged her to change providers if she feels needs aren't being met. And we discussed her best option for urgent access to mental health care is [facility]. I explained to pt why I am not able to rx meds she is requesting due to limitations of virtual care.  ? ?Follow Up Instructions: ?I discussed the assessment and treatment plan with the patient. The patient was provided an opportunity to ask questions and all were answered. The patient agreed with the plan and demonstrated an understanding of the instructions.  A copy of instructions were sent to the patient via MyChart unless otherwise noted below.  ? ?The patient was advised to call back or seek an in-person evaluation if the symptoms worsen or if the condition fails to improve as anticipated. ? ?Time:  ?I spent 10 minutes with the patient via telehealth technology discussing the above problems/concerns.   ? ?Kelsey Getting, NP ?

## 2021-06-17 DIAGNOSIS — Z0000 Encounter for general adult medical examination without abnormal findings: Secondary | ICD-10-CM | POA: Diagnosis not present

## 2021-06-17 DIAGNOSIS — F411 Generalized anxiety disorder: Secondary | ICD-10-CM | POA: Diagnosis not present

## 2021-06-17 DIAGNOSIS — R69 Illness, unspecified: Secondary | ICD-10-CM | POA: Diagnosis not present

## 2021-06-17 DIAGNOSIS — G894 Chronic pain syndrome: Secondary | ICD-10-CM | POA: Diagnosis not present

## 2021-06-17 DIAGNOSIS — R202 Paresthesia of skin: Secondary | ICD-10-CM | POA: Diagnosis not present

## 2021-06-18 DIAGNOSIS — R69 Illness, unspecified: Secondary | ICD-10-CM | POA: Diagnosis not present

## 2021-06-18 DIAGNOSIS — G479 Sleep disorder, unspecified: Secondary | ICD-10-CM | POA: Diagnosis not present

## 2021-06-18 DIAGNOSIS — R519 Headache, unspecified: Secondary | ICD-10-CM | POA: Diagnosis not present

## 2021-06-18 DIAGNOSIS — M5442 Lumbago with sciatica, left side: Secondary | ICD-10-CM | POA: Diagnosis not present

## 2021-06-18 DIAGNOSIS — R569 Unspecified convulsions: Secondary | ICD-10-CM | POA: Insufficient documentation

## 2021-06-27 ENCOUNTER — Other Ambulatory Visit: Payer: Self-pay

## 2021-06-27 ENCOUNTER — Emergency Department: Payer: 59

## 2021-06-27 ENCOUNTER — Emergency Department
Admission: EM | Admit: 2021-06-27 | Discharge: 2021-06-27 | Disposition: A | Discharge status: Home / Self Care | Payer: 59 | Attending: Emergency Medicine | Admitting: Emergency Medicine

## 2021-06-27 DIAGNOSIS — T40601A Poisoning by unspecified narcotics, accidental (unintentional), initial encounter: Secondary | ICD-10-CM

## 2021-06-27 DIAGNOSIS — T50901A Poisoning by unspecified drugs, medicaments and biological substances, accidental (unintentional), initial encounter: Secondary | ICD-10-CM | POA: Insufficient documentation

## 2021-06-27 DIAGNOSIS — R0689 Other abnormalities of breathing: Secondary | ICD-10-CM | POA: Diagnosis not present

## 2021-06-27 DIAGNOSIS — R4020 Unspecified coma: Secondary | ICD-10-CM | POA: Diagnosis not present

## 2021-06-27 DIAGNOSIS — R69 Illness, unspecified: Secondary | ICD-10-CM | POA: Diagnosis not present

## 2021-06-27 DIAGNOSIS — R0902 Hypoxemia: Secondary | ICD-10-CM | POA: Diagnosis not present

## 2021-06-27 DIAGNOSIS — T50904A Poisoning by unspecified drugs, medicaments and biological substances, undetermined, initial encounter: Secondary | ICD-10-CM | POA: Diagnosis not present

## 2021-06-27 DIAGNOSIS — R0602 Shortness of breath: Secondary | ICD-10-CM | POA: Diagnosis not present

## 2021-06-27 DIAGNOSIS — R000 Tachycardia, unspecified: Secondary | ICD-10-CM | POA: Diagnosis not present

## 2021-06-27 DIAGNOSIS — R404 Transient alteration of awareness: Secondary | ICD-10-CM | POA: Diagnosis not present

## 2021-06-27 LAB — POC URINE PREG, ED: Preg Test, Ur: NEGATIVE

## 2021-06-27 IMAGING — CR DG CHEST 2V
1 series · 2 of 2 positions shown · non-contrast
Comparison: None Available.

CLINICAL DATA: Shortness of breath

EXAM:
CHEST - 2 VIEW

[Series 1: dg chest 2 view · 0.14mm/px · 2 of 2 slices shown]
[im 1/2]
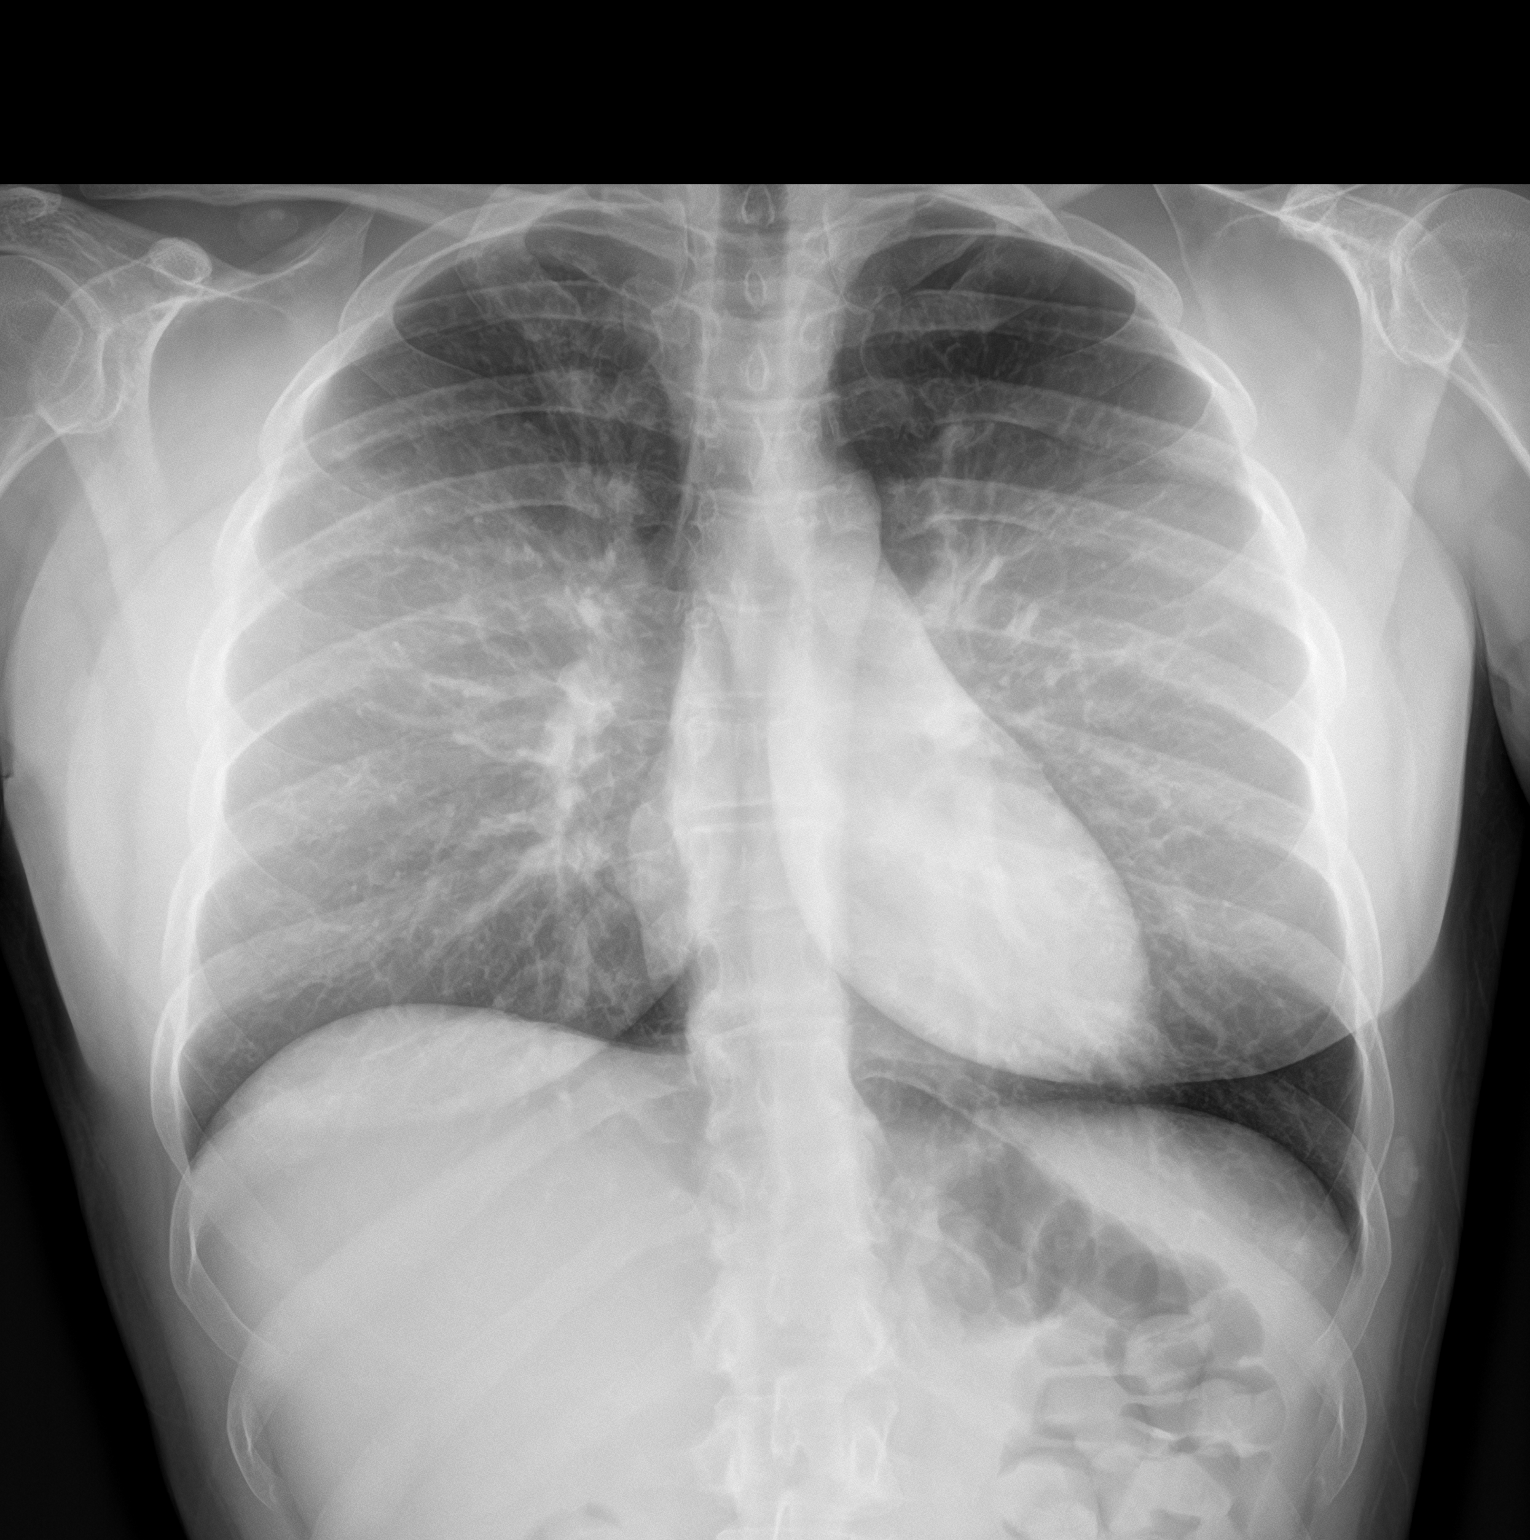
[im 2/2]
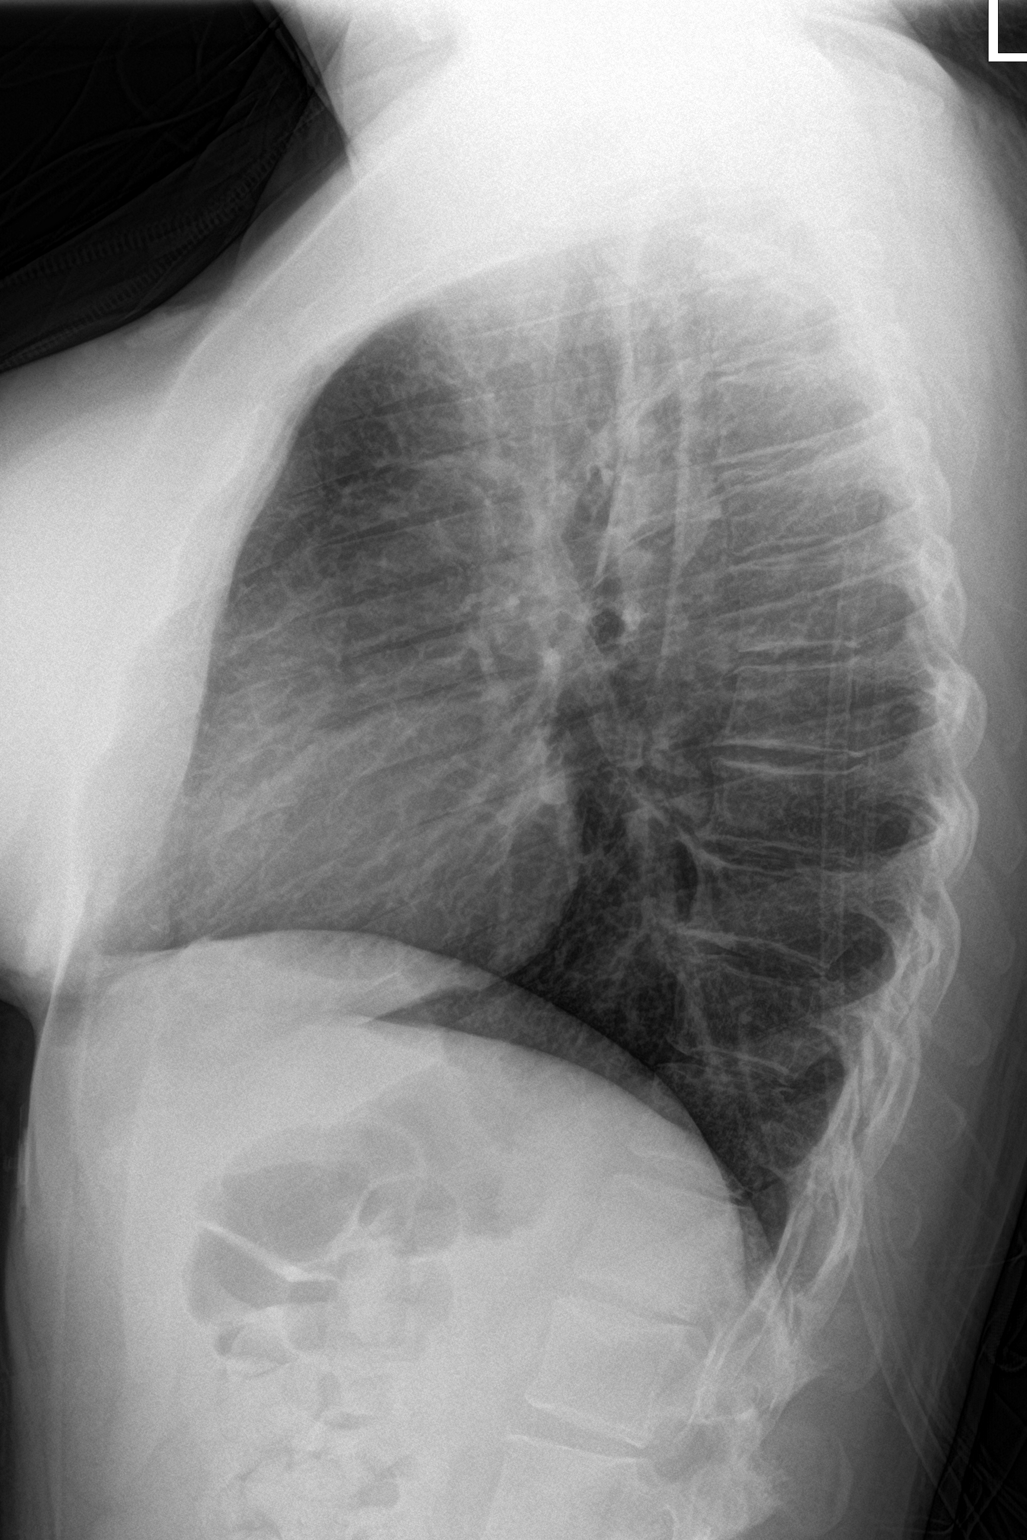

[2 of 2 positions shown; findings below may reference images not displayed]

FINDINGS: Lungs are clear.  No pleural effusion or pneumothorax.

The heart is normal in size.

Visualized osseous structures are within normal limits.
IMPRESSION: Normal chest radiographs.

## 2021-06-27 MED ORDER — ONDANSETRON HCL 4 MG/2ML IJ SOLN
4.0000 mg | Freq: Once | INTRAMUSCULAR | Status: AC
  Administered 2021-06-27: 4 mg via INTRAVENOUS

## 2021-06-27 MED ORDER — ONDANSETRON HCL 4 MG/2ML IJ SOLN
INTRAMUSCULAR | Status: AC
  Filled 2021-06-27: qty 2

## 2021-06-27 NOTE — Discharge Instructions (Addendum)
As we discussed please do not take any sedating medications including Benadryl, Xanax, Ambien, Flexeril, alcohol.  We suggest that you stay with your mother tonight for further monitoring.  Return to the emergency department for any symptom personally concerning to yourself.  Please do not take opioids in the future. ?

## 2021-06-27 NOTE — ED Triage Notes (Signed)
Pt presents to ER after accidental drug OD.  Pt states she took a half a pill of fentanyl around one hour ago and went unconscious.  Mother with pt states that she was unable to wake up pt.  Pt given 2 mg IV narcan with pt waking up afterwards.  Pt is currently A&O x4 and in NAD.   Pt is noted to be 87% on RA.  Sats 95% on 3L. ?

## 2021-06-27 NOTE — ED Provider Notes (Signed)
? ?[facility] ?Provider Note ? ? ? Event Date/Time  ? First MD Initiated Contact with Patient 06/27/21 2222   ?  (approximate) ? ?History  ? ?Chief Complaint: Drug Overdose ? ?HPI ? ?Kelsey Ho is a 31 y.o. female who presents to the emergency department after an accidental overdose.  According to the patient she has a history of chronic pain was previously under a pain management physician in [place] however since moving to [place] patient has not established care with pain management, states she has not been on any of her pain meds and she is having extreme trouble living her day-to-day life.  Patient has been seen by [facility] has been prescribed other controlled medications such as Xanax, Ambien, patient also takes Flexeril for her chronic pain issues.  Patient states the pain had worsened to the point where she bought a "fentanyl pill" from a friend.  Patient states she took one half of the pill around 5 PM.  Mom states around 7 PM they found the patient and she does not appear to be responsive.  They called EMS.  EMS states when they arrived shortly after and gave 2 mg of IV Narcan and upon arrival to the ER patient was awake alert oriented x4.  Mom is here with the patient.  Patient is tearful, states that her police going through her house right now as the police obtained a search warrant given the amount of pills at the patient's house. ? ?Physical Exam  ? ?Triage Vital Signs: ?ED Triage Vitals  ?Enc Vitals Group  ?   BP 06/27/21 2019 (!) 134/93  ?   Pulse Rate 06/27/21 2019 (!) 116  ?   Resp 06/27/21 2019 18  ?   Temp 06/27/21 2019 98.3 ?F (36.8 ?C)  ?   Temp Source 06/27/21 2019 Oral  ?   SpO2 06/27/21 2019 98 %  ?   Weight 06/27/21 2020 125 lb (56.7 kg)  ?   Height 06/27/21 2020 5\' 1"  (1.549 m)  ?   Head Circumference --   ?   Peak Flow --   ?   Pain Score 06/27/21 2031 3  ?   Pain Loc --   ?   Pain Edu? --   ?   Excl. in [place]? --   ? ? ?Most recent vital signs: ?Vitals:  ?  06/27/21 2019  ?BP: (!) 134/93  ?Pulse: (!) 116  ?Resp: 18  ?Temp: 98.3 ?F (36.8 ?C)  ?SpO2: 98%  ? ? ?General: Awake, tearful. ?CV:  Good peripheral perfusion.  Regular rhythm rate around 100 to 110 bpm ?Resp:  Normal effort.  Equal breath sounds bilaterally.  ?Abd:  No distention.  Soft, nontender.  No rebound or guarding. ? ? ? ?ED Results / Procedures / Treatments  ? ?EKG ? ?EKG viewed and interpreted by myself shows sinus tachycardia 115 bpm with a narrow QRS, normal axis, normal intervals, no concerning ST changes. ? ?RADIOLOGY ? ?I personally viewed the patient's chest x-ray images.  No obvious concerning findings on my evaluation. ?Radiology is read the chest x-ray is negative. ? ? ?MEDICATIONS ORDERED IN ED: ?Medications  ?ondansetron (ZOFRAN) injection 4 mg (4 mg Intravenous Given 06/27/21 2046)  ? ? ? ?IMPRESSION / MDM / ASSESSMENT AND PLAN / ED COURSE  ?I reviewed the triage vital signs and the nursing notes. ? ?Patient presents emergency department after an accidental overdose.  Patient denies any SI.  Denies any other drugs or  alcohol.  Patient states she used to be under pain management for chronic back pain however since moving to [place] she has not had any pain management.  Has an appointment with pain management in June.  Patient states she attempted to buy a pill from a friend for her pain control, took half a tablet and then was found unconscious.  Patient received Narcan shortly after 7 PM it is now 10:45 PM patient remains awake alert.  No O2 requirement.  Patient states she does not normally do this and she is regretful.  Patient is also tearful as she states police are currently conducting a search warrant at her house.  I discussed with the patient she is not to take any sedating medications tonight such as Xanax Flexeril or Ambien.  Suggested that her mother should stay with the patient tonight.  They understand this.  Patient is asking to go home.  I believe patient is safe for  discharge home as it is now been over 3 and half hours since Narcan administration and the patient remains awake alert oriented x4. ? ?FINAL CLINICAL IMPRESSION(S) / ED DIAGNOSES  ? ?Accidental overdose ? ? ? ?Note:  This document was prepared using Dragon voice recognition software and may include unintentional dictation errors. ?  Harvest Dark, MD ?06/27/21 2246 ? ?

## 2021-06-27 NOTE — ED Notes (Signed)
Pt in family wait ?

## 2021-07-02 DIAGNOSIS — R69 Illness, unspecified: Secondary | ICD-10-CM | POA: Diagnosis not present

## 2021-07-09 DIAGNOSIS — R69 Illness, unspecified: Secondary | ICD-10-CM | POA: Diagnosis not present

## 2021-07-16 DIAGNOSIS — R69 Illness, unspecified: Secondary | ICD-10-CM | POA: Diagnosis not present

## 2021-07-21 ENCOUNTER — Other Ambulatory Visit: Payer: Self-pay

## 2021-07-21 ENCOUNTER — Emergency Department
Admission: EM | Admit: 2021-07-21 | Discharge: 2021-07-21 | Disposition: A | Discharge status: Home / Self Care | Payer: 59 | Attending: Emergency Medicine | Admitting: Emergency Medicine

## 2021-07-21 ENCOUNTER — Encounter: Payer: Self-pay | Admitting: Emergency Medicine

## 2021-07-21 DIAGNOSIS — M79605 Pain in left leg: Secondary | ICD-10-CM | POA: Diagnosis not present

## 2021-07-21 DIAGNOSIS — M79604 Pain in right leg: Secondary | ICD-10-CM | POA: Diagnosis not present

## 2021-07-21 DIAGNOSIS — G8929 Other chronic pain: Secondary | ICD-10-CM | POA: Insufficient documentation

## 2021-07-21 DIAGNOSIS — M549 Dorsalgia, unspecified: Secondary | ICD-10-CM | POA: Diagnosis not present

## 2021-07-21 MED ORDER — KETOROLAC TROMETHAMINE 30 MG/ML IJ SOLN
30.0000 mg | Freq: Once | INTRAMUSCULAR | Status: AC
Start: 2021-07-21 — End: 2021-07-21
  Administered 2021-07-21: 30 mg via INTRAMUSCULAR
  Filled 2021-07-21: qty 1

## 2021-07-21 MED ORDER — PREDNISONE 10 MG PO TABS: ORAL_TABLET | ORAL | 0 refills | Status: AC

## 2021-07-21 MED ORDER — PREDNISONE 20 MG PO TABS
60.0000 mg | ORAL_TABLET | Freq: Once | ORAL | Status: AC
  Administered 2021-07-21: 60 mg via ORAL
  Filled 2021-07-21: qty 3

## 2021-07-21 NOTE — ED Triage Notes (Signed)
Pt reports pain that starts in her right lower back/buttocks and radiates down her right leg. Pt reports has had this issue before and was told it may be a nerve.

## 2021-07-21 NOTE — ED Provider Notes (Signed)
[facility] Provider Note    Event Date/Time   First MD Initiated Contact with Patient 07/21/21 1653     (approximate)   History   Leg Pain and Back Pain   HPI  Kelsey Ho is a 31 y.o. female with past medical history of substance abuse, chronic pain who presents with complaints of bilateral leg pain and back pain.  Apparently this has been going on for some time.  She has seen neurology, had EMG for this, has had multiple MRIs, I have reviewed neurology outpatient notes with Dr. Malvin Johns, he has referred her to pain management  She is anxious and tearful and requesting a fix for her pain     Physical Exam   Triage Vital Signs: ED Triage Vitals  Enc Vitals Group     BP 07/21/21 1702 97/74     Pulse Rate 07/21/21 1702 (!) 111     Resp 07/21/21 1702 18     Temp 07/21/21 1702 98.2 F (36.8 C)     Temp Source 07/21/21 1702 Oral     SpO2 07/21/21 1702 99 %     Weight 07/21/21 1647 56 kg (123 lb 7.3 oz)     Height 07/21/21 1647 1.549 m (5\' 1" )     Head Circumference --      Peak Flow --      Pain Score 07/21/21 1647 10     Pain Loc --      Pain Edu? --      Excl. in [place]? --     Most recent vital signs: Vitals:   07/21/21 1702  BP: 97/74  Pulse: (!) 111  Resp: 18  Temp: 98.2 F (36.8 C)  SpO2: 99%     General: Awake, no distress.  CV:  Good peripheral perfusion.  Resp:  Normal effort.  Abd:  No distention.  Other:  Normal strength in the lower extremities, warm and well-perfused distally bilaterally, no vertebral tenderness palpation   ED Results / Procedures / Treatments   Labs (all labs ordered are listed, but only abnormal results are displayed) Labs Reviewed - No data to display   EKG     RADIOLOGY     PROCEDURES:  Critical Care performed:   Procedures   MEDICATIONS ORDERED IN ED: Medications  ketorolac (TORADOL) 30 MG/ML injection 30 mg (30 mg Intramuscular Given 07/21/21 1734)  predniSONE (DELTASONE)  tablet 60 mg (60 mg Oral Given 07/21/21 1734)     IMPRESSION / MDM / ASSESSMENT AND PLAN / ED COURSE  I reviewed the triage vital signs and the nursing notes. Patient's presentation is most consistent with exacerbation of chronic illness.  Patient presents with essentially chronic pain of unknown origin complicated by history of polysubstance abuse, with recent overdose seen in the emergency department.  Unclear cause of her symptoms, exam here is overall reassuring  Discussed this with mother at length and with patient.  Will trial prednisone taper, IM Toradol given for acute pain  Recommend follow-up with physiatry, neurosurgery        FINAL CLINICAL IMPRESSION(S) / ED DIAGNOSES   Final diagnoses:  Other chronic pain     Rx / DC Orders   ED Discharge Orders          Ordered    predniSONE (DELTASONE) 10 MG tablet        07/21/21 1732             Note:  This document was prepared using  [employer]agon [employer] and may include unintentional dictation errors.   Jene Every, MD 07/21/21 Ebony Cargo

## 2021-07-23 ENCOUNTER — Emergency Department: Payer: 59

## 2021-07-23 ENCOUNTER — Emergency Department
Admission: EM | Admit: 2021-07-23 | Discharge: 2021-07-23 | Disposition: A | Discharge status: Home / Self Care | Payer: 59 | Attending: Emergency Medicine | Admitting: Emergency Medicine

## 2021-07-23 ENCOUNTER — Other Ambulatory Visit: Payer: Self-pay

## 2021-07-23 DIAGNOSIS — G8929 Other chronic pain: Secondary | ICD-10-CM

## 2021-07-23 DIAGNOSIS — M6281 Muscle weakness (generalized): Secondary | ICD-10-CM | POA: Insufficient documentation

## 2021-07-23 DIAGNOSIS — M545 Low back pain, unspecified: Secondary | ICD-10-CM | POA: Diagnosis not present

## 2021-07-23 DIAGNOSIS — R69 Illness, unspecified: Secondary | ICD-10-CM | POA: Diagnosis not present

## 2021-07-23 DIAGNOSIS — R200 Anesthesia of skin: Secondary | ICD-10-CM | POA: Insufficient documentation

## 2021-07-23 DIAGNOSIS — M5459 Other low back pain: Secondary | ICD-10-CM | POA: Diagnosis not present

## 2021-07-23 DIAGNOSIS — D72828 Other elevated white blood cell count: Secondary | ICD-10-CM | POA: Diagnosis not present

## 2021-07-23 DIAGNOSIS — R222 Localized swelling, mass and lump, trunk: Secondary | ICD-10-CM | POA: Diagnosis not present

## 2021-07-23 DIAGNOSIS — R519 Headache, unspecified: Secondary | ICD-10-CM | POA: Diagnosis not present

## 2021-07-23 LAB — CBC WITH DIFFERENTIAL/PLATELET
Abs Immature Granulocytes: 0.12 10*3/uL — ABNORMAL HIGH (ref 0.00–0.07)
Basophils Absolute: 0 10*3/uL (ref 0.0–0.1)
Basophils Relative: 0 %
Eosinophils Absolute: 0 10*3/uL (ref 0.0–0.5)
Eosinophils Relative: 0 %
HCT: 45.9 % (ref 36.0–46.0)
Hemoglobin: 15 g/dL (ref 12.0–15.0)
Immature Granulocytes: 1 %
Lymphocytes Relative: 5 %
Lymphs Abs: 0.9 10*3/uL (ref 0.7–4.0)
MCH: 28.6 pg (ref 26.0–34.0)
MCHC: 32.7 g/dL (ref 30.0–36.0)
MCV: 87.4 fL (ref 80.0–100.0)
Monocytes Absolute: 0.4 10*3/uL (ref 0.1–1.0)
Monocytes Relative: 2 %
Neutro Abs: 15.4 10*3/uL — ABNORMAL HIGH (ref 1.7–7.7)
Neutrophils Relative %: 92 %
Platelets: 394 10*3/uL (ref 150–400)
RBC: 5.25 MIL/uL — ABNORMAL HIGH (ref 3.87–5.11)
RDW: 12.9 % (ref 11.5–15.5)
WBC: 16.9 10*3/uL — ABNORMAL HIGH (ref 4.0–10.5)
nRBC: 0 % (ref 0.0–0.2)

## 2021-07-23 LAB — COMPREHENSIVE METABOLIC PANEL
ALT: 15 U/L (ref 0–44)
AST: 18 U/L (ref 15–41)
Albumin: 5 g/dL (ref 3.5–5.0)
Alkaline Phosphatase: 60 U/L (ref 38–126)
Anion gap: 14 (ref 5–15)
BUN: 15 mg/dL (ref 6–20)
CO2: 27 mmol/L (ref 22–32)
Calcium: 10.7 mg/dL — ABNORMAL HIGH (ref 8.9–10.3)
Chloride: 100 mmol/L (ref 98–111)
Creatinine, Ser: 0.82 mg/dL (ref 0.44–1.00)
GFR, Estimated: >60 mL/min (ref >60)
Glucose, Bld: 123 mg/dL — ABNORMAL HIGH (ref 70–99)
Potassium: 3.8 mmol/L (ref 3.5–5.1)
Sodium: 141 mmol/L (ref 135–145)
Total Bilirubin: 1.1 mg/dL (ref 0.3–1.2)
Total Protein: 8.4 g/dL — ABNORMAL HIGH (ref 6.5–8.1)

## 2021-07-23 LAB — URINALYSIS, ROUTINE W REFLEX MICROSCOPIC
Bilirubin Urine: NEGATIVE
Glucose, UA: NEGATIVE mg/dL
Hgb urine dipstick: NEGATIVE
Ketones, ur: 80 mg/dL — AB
Leukocytes,Ua: NEGATIVE
Nitrite: NEGATIVE
Protein, ur: 30 mg/dL — AB
Specific Gravity, Urine: 1.029 (ref 1.005–1.030)
pH: 6 (ref 5.0–8.0)

## 2021-07-23 LAB — TSH: TSH: 0.595 u[IU]/mL (ref 0.350–4.500)

## 2021-07-23 IMAGING — CT CT HEAD W/O CM
4 series · 17 of 47 positions shown, 19 images · non-contrast
Comparison: None Available.

CLINICAL DATA: Bilateral leg pain, numbness, back pain, tremors



[Series 2: head wo · axial · 0.43mm/px · z∈[-163,-43]mm · 7 of 32 slices shown, 9 images]
[im 4/32  brain]
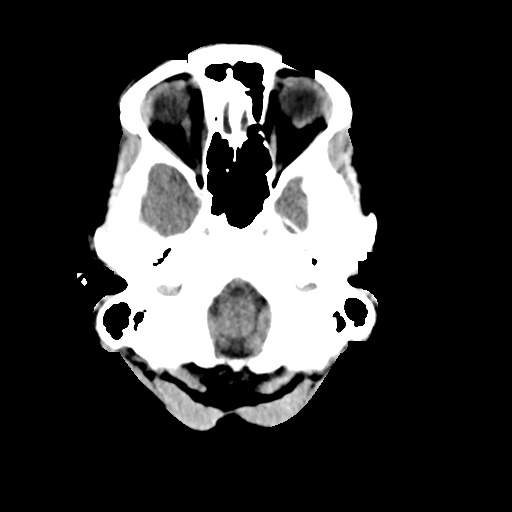
[im 4/32  bone]
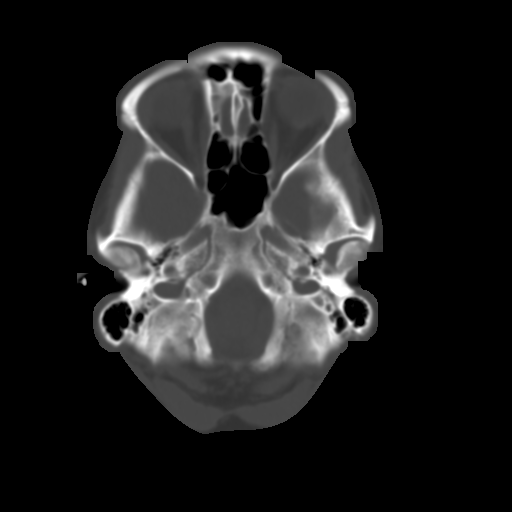
[im 8/32  brain]
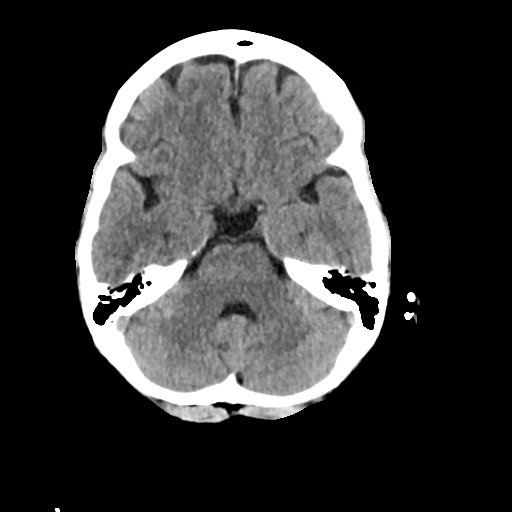
[im 12/32  brain]
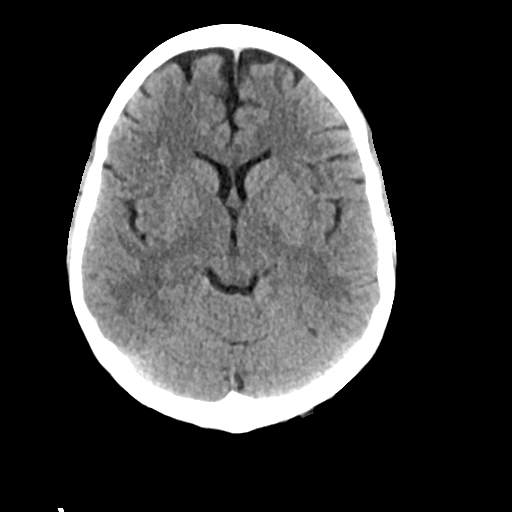
[im 16/32  brain]
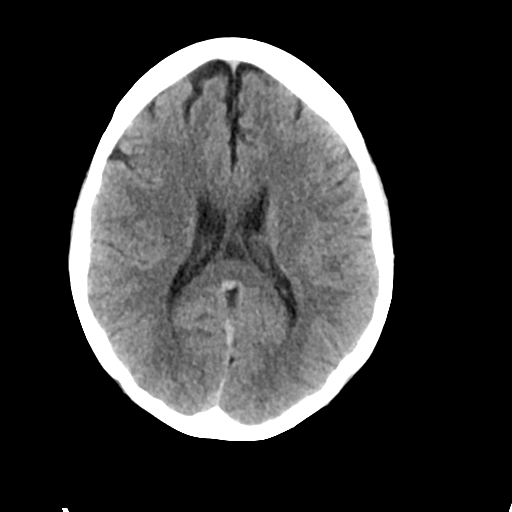
[im 20/32  brain]
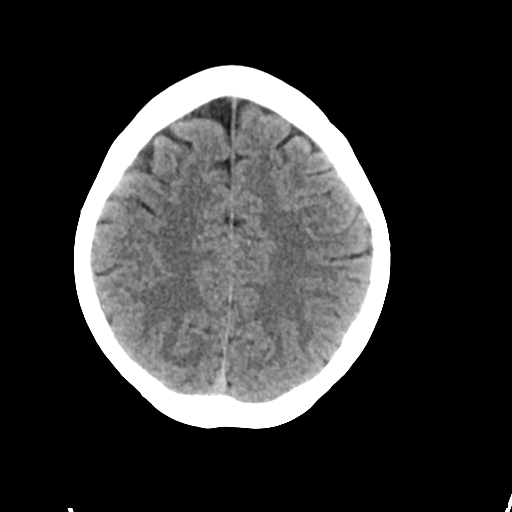
[im 20/32  bone]
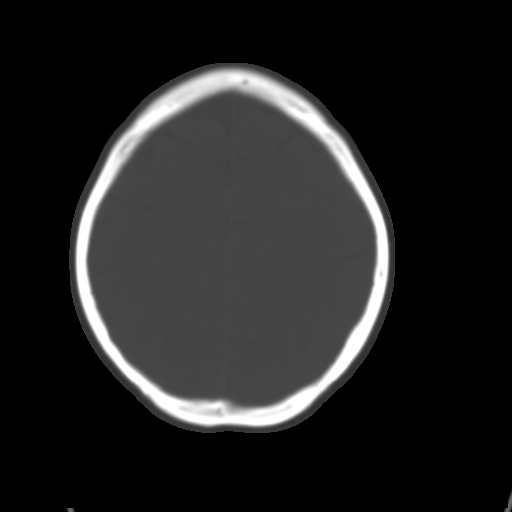
[im 24/32  brain]
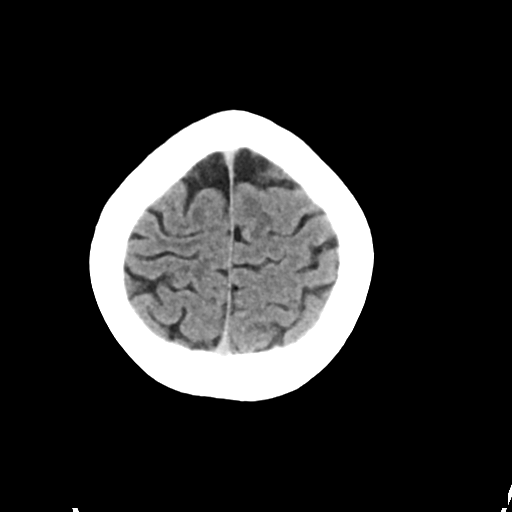
[im 28/32  brain]
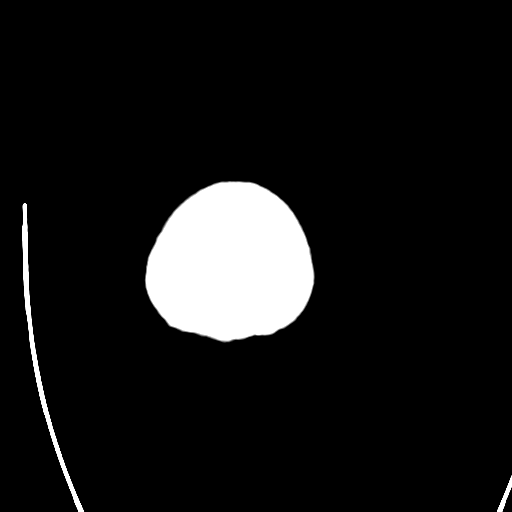

[Series 3: head bone · axial · 0.43mm/px · z∈[-164,-108]mm · 4 of 80 slices shown]
[im 8/80  bone]
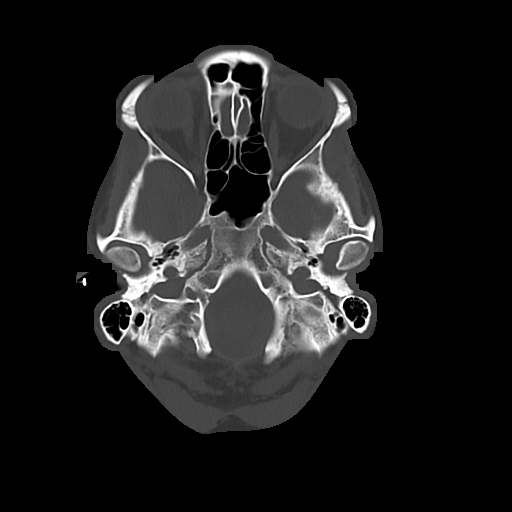
[im 16/80  bone]
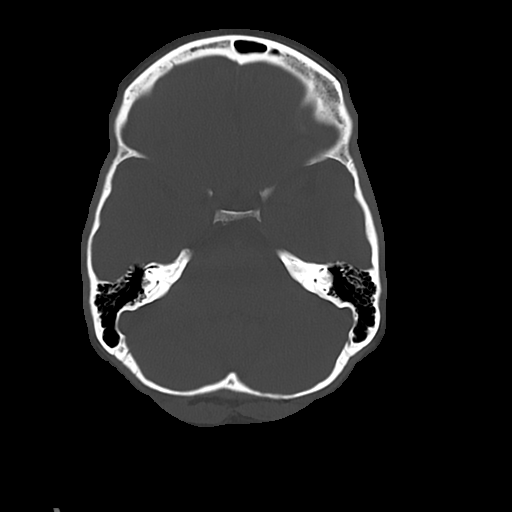
[im 24/80  bone]
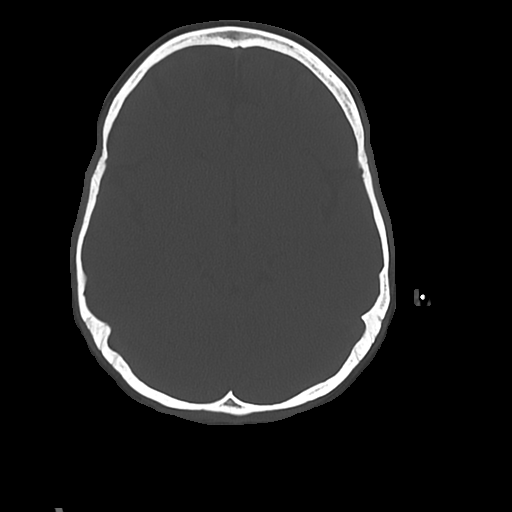
[im 36/80  bone]
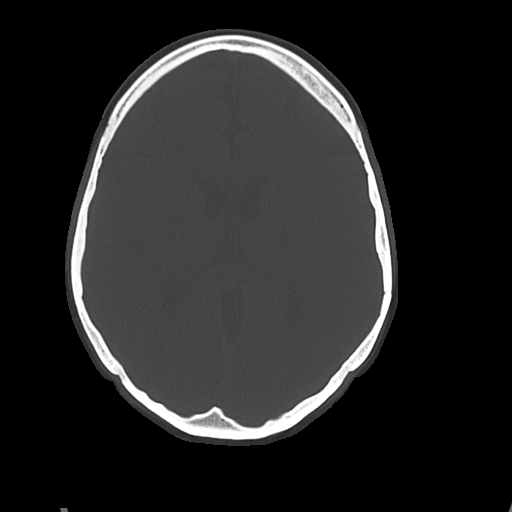

[Series 4: cor soft · coronal · 0.31mm/px · 3 of 63 slices shown]
[im 21/63  brain]
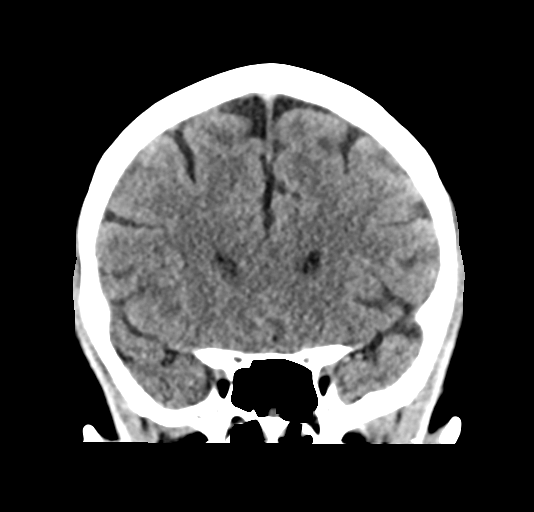
[im 28/63  brain]
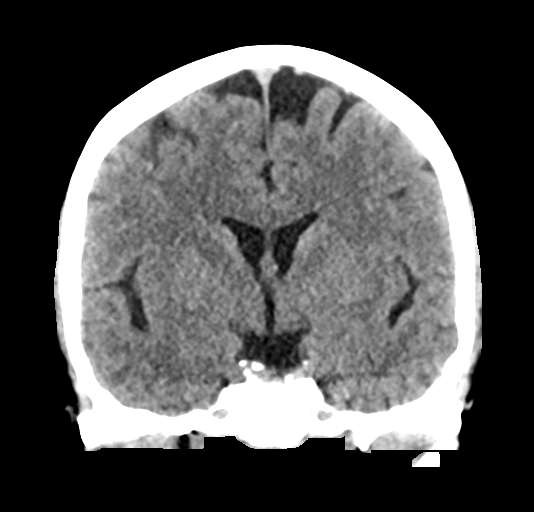
[im 35/63  brain]
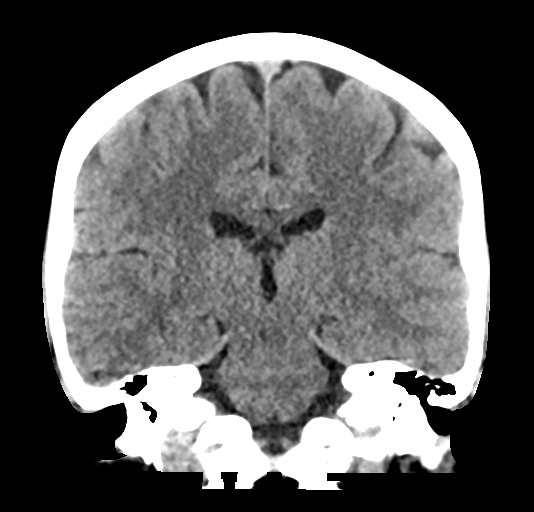

[Series 5: sag soft · sagittal · 0.31mm/px · 3 of 57 slices shown]
[im 19/57  brain]
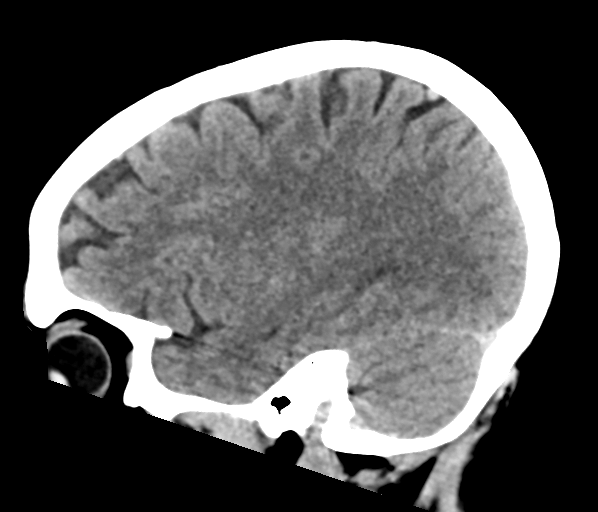
[im 29/57  brain]
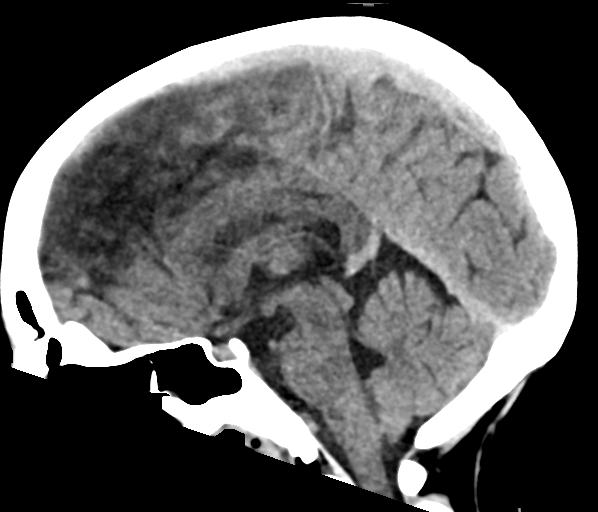
[im 38/57  brain]
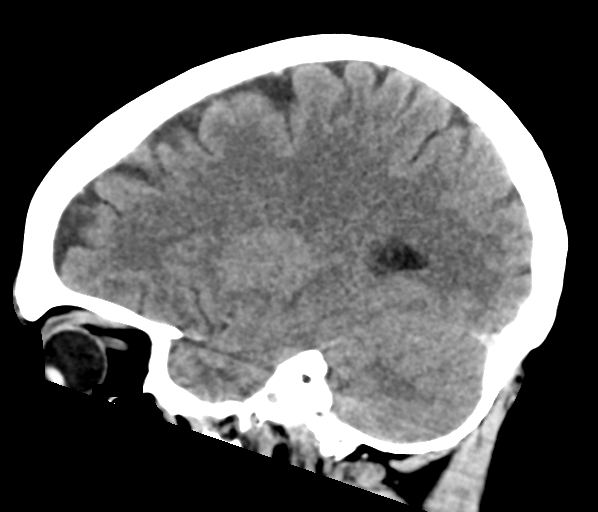

[17 of 47 positions shown; findings below may reference images not displayed]

FINDINGS: Brain: No evidence of acute infarction, hemorrhage, hydrocephalus,
extra-axial collection or mass lesion/mass effect.

Vascular: No hyperdense vessel or unexpected calcification.

Skull: Normal. Negative for fracture or focal lesion.

Sinuses/Orbits: Retention cyst or polyp in left maxillary sinus,
incompletely visualized. Otherwise negative

Other: None
IMPRESSION: No acute findings

## 2021-07-23 IMAGING — MR MR LUMBAR SPINE W/O CM
5 series · 31 of 48 positions shown · non-contrast
Comparison: [DATE]

CLINICAL DATA: Low back pain, cauda equina syndrome suspected

EXAM:
MRI LUMBAR SPINE WITHOUT CONTRAST
TECHNIQUE: Multiplanar, multisequence MR imaging of the lumbar spine was
performed. No intravenous contrast was administered.

[Series 9: T2 · sagittal · 4.0mm · 0.81mm/px · 6 of 17 slices shown (1 of 2)]
[im 1/17]
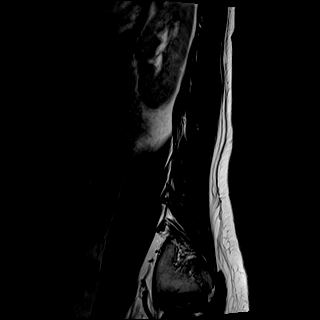
[im 4/17]
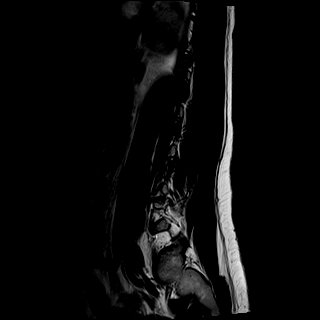
[im 7/17]
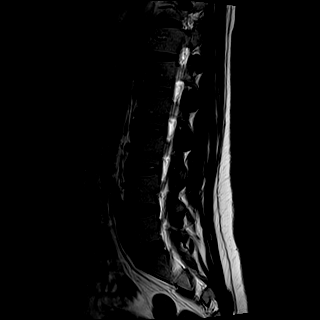
[im 10/17]
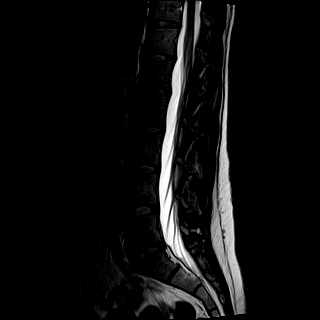
[im 13/17]
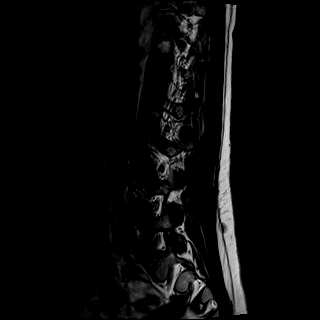
[im 17/17]
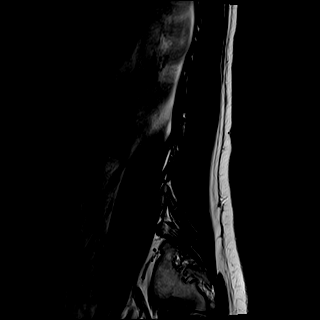

[Series 10: T1 · sagittal · 4.0mm · 0.81mm/px · 7 of 17 slices shown (1 of 2)]
[im 1/17]
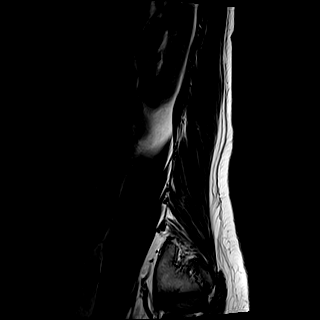
[im 3/17]
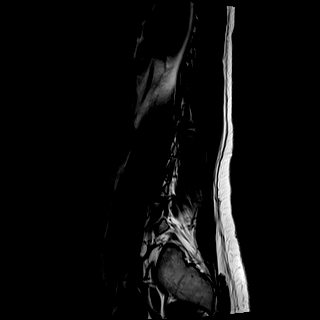
[im 6/17]
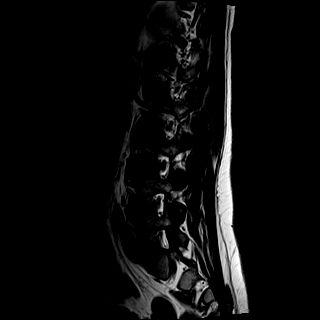
[im 9/17]
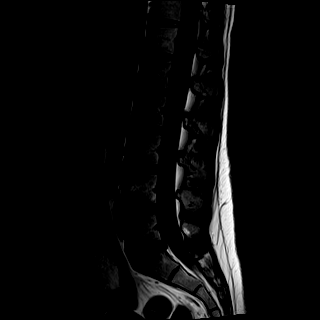
[im 11/17]
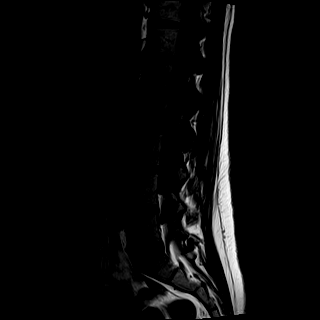
[im 14/17]
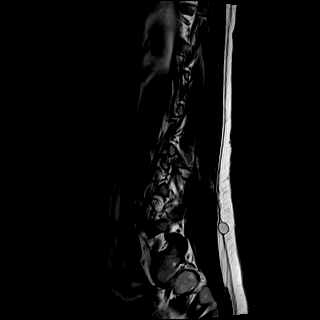
[im 17/17]
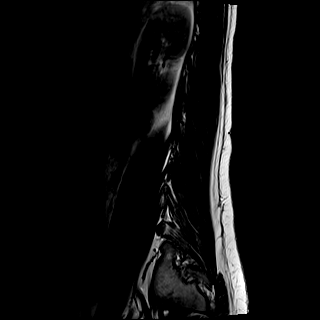

[Series 11: STIR · sagittal · 4.0mm · 0.41mm/px · 2 of 17 slices shown]
[im 1/17]
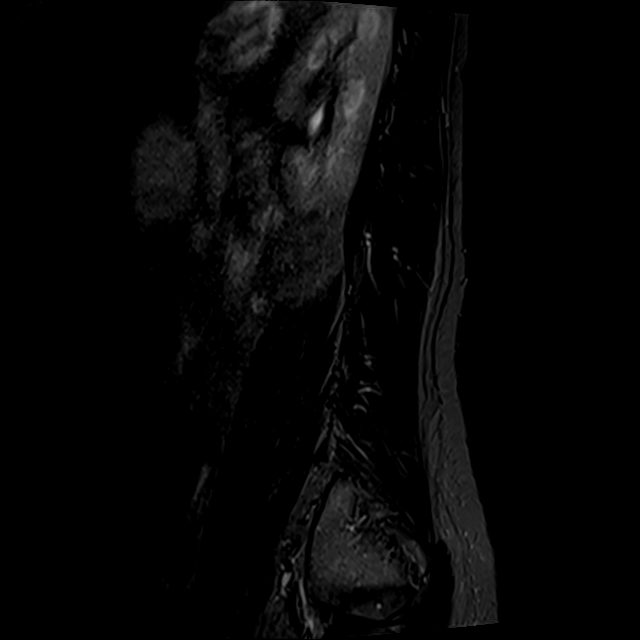
[im 3/17]
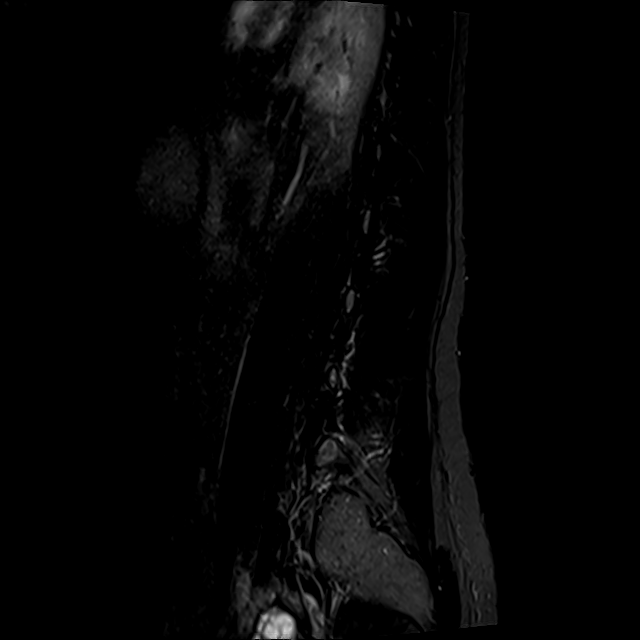

[Series 12: T2 · axial · 4.0mm · 0.78mm/px · z∈[-74,+138]mm · 8 of 36 slices shown (2 of 2)]
[im 1/36]
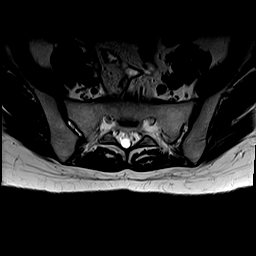
[im 6/36]
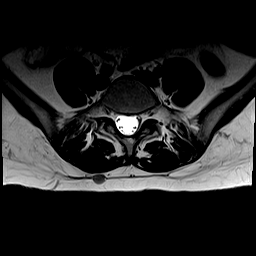
[im 11/36]
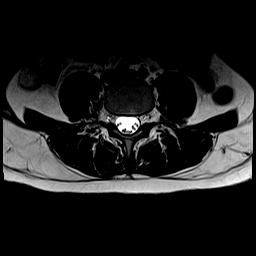
[im 17/36]
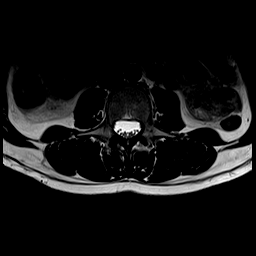
[im 19/36]
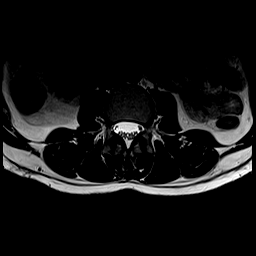
[im 25/36]
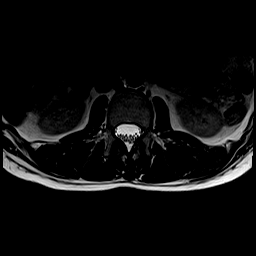
[im 30/36]
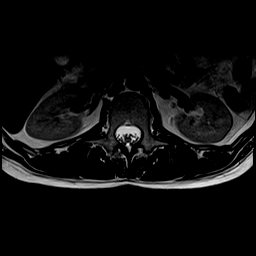
[im 36/36]
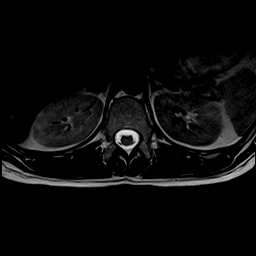

[Series 13: T1 · axial · 4.0mm · 0.39mm/px · z∈[-74,+138]mm · 8 of 36 slices shown (2 of 2)]
[im 1/36]
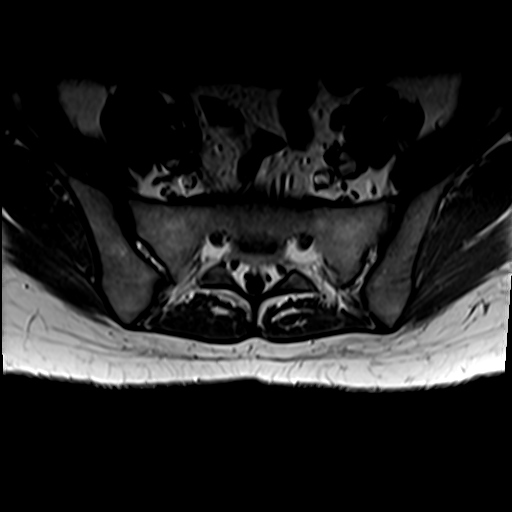
[im 6/36]
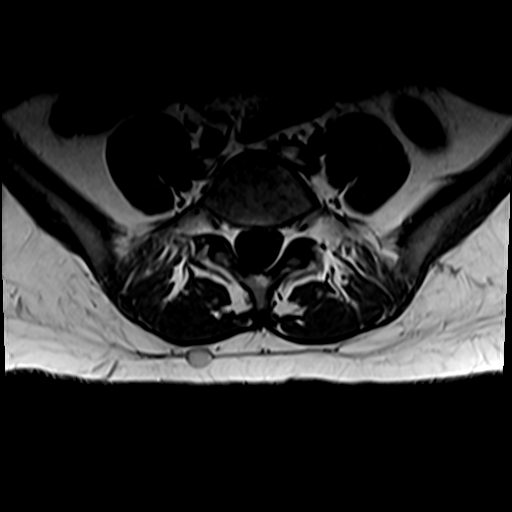
[im 11/36]
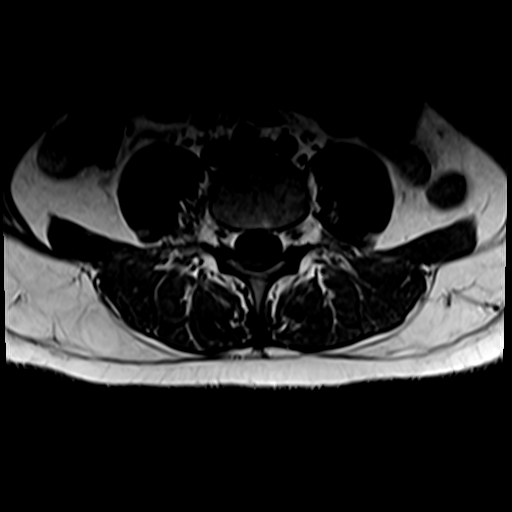
[im 17/36]
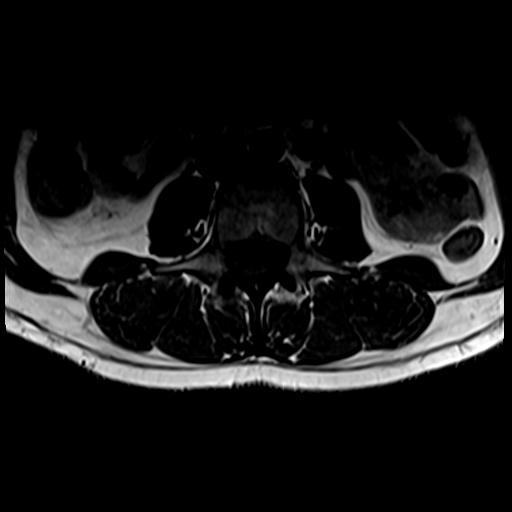
[im 19/36]
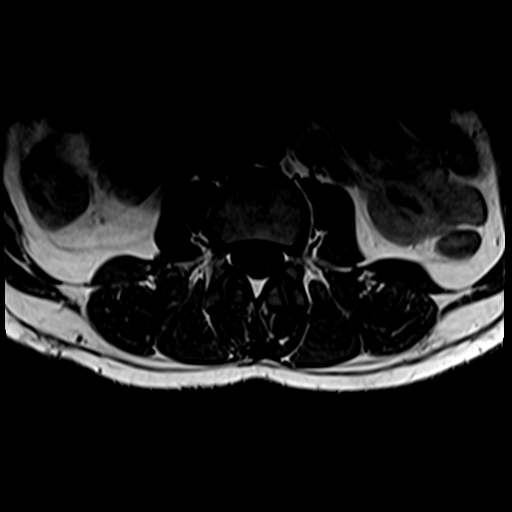
[im 25/36]
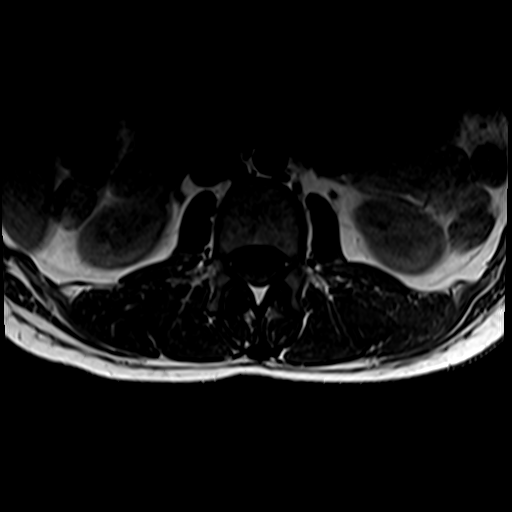
[im 30/36]
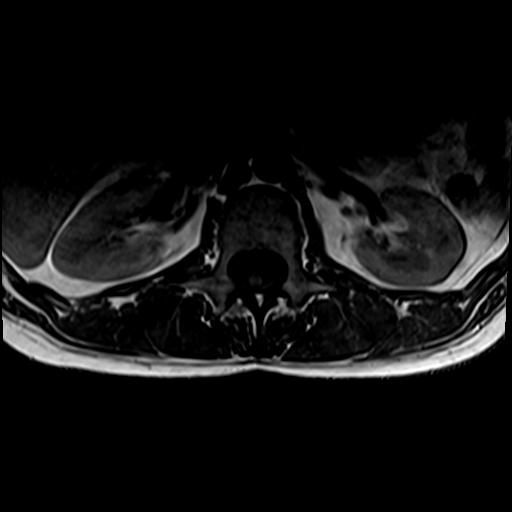
[im 36/36]
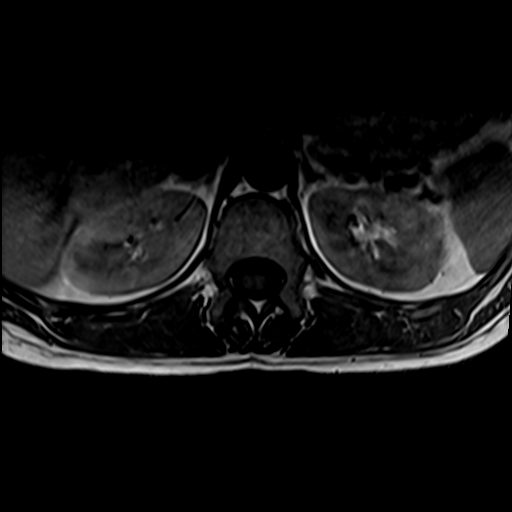

[31 of 48 positions shown; findings below may reference images not displayed]

FINDINGS: Segmentation:  Standard.

Alignment:  Preserved.

Vertebrae: Stable vertebral body heights with mild loss of height at
the L1 superior endplate and superimposed small Schmorl's node. No
marrow edema. No suspicious osseous lesion.

Conus medullaris and cauda equina: Conus extends to the L1 level.
Conus and cauda equina appear normal.

Paraspinal and other soft tissues: Unchanged subcentimeter soft
tissue nodule in the subcutaneous fat of the right lower back of
doubtful significance. Otherwise unremarkable.

Disc levels: Intervertebral disc heights and signal remain
preserved. There is no new disc herniation or stenosis.
IMPRESSION: No acute abnormality or change from [DATE] study.

## 2021-07-23 MED ORDER — LIDOCAINE 5 % EX PTCH
1.0000 | MEDICATED_PATCH | CUTANEOUS | Status: DC
  Administered 2021-07-23: 1 via TRANSDERMAL
  Filled 2021-07-23: qty 1

## 2021-07-23 MED ORDER — KETOROLAC TROMETHAMINE 10 MG PO TABS: 10.0000 mg | ORAL_TABLET | Freq: Four times a day (QID) | ORAL | 0 refills | Status: DC | PRN

## 2021-07-23 MED ORDER — SODIUM CHLORIDE 0.9 % IV BOLUS
1000.0000 mL | Freq: Once | INTRAVENOUS | Status: AC
  Administered 2021-07-23: 1000 mL via INTRAVENOUS

## 2021-07-23 MED ORDER — KETOROLAC TROMETHAMINE 30 MG/ML IJ SOLN
30.0000 mg | Freq: Once | INTRAMUSCULAR | Status: AC
  Administered 2021-07-23: 30 mg via INTRAVENOUS
  Filled 2021-07-23: qty 1

## 2021-07-23 MED ORDER — LORAZEPAM 2 MG/ML IJ SOLN
0.5000 mg | Freq: Once | INTRAMUSCULAR | Status: AC
  Administered 2021-07-23: 0.5 mg via INTRAVENOUS
  Filled 2021-07-23: qty 1

## 2021-07-23 MED ORDER — CYCLOBENZAPRINE HCL 10 MG PO TABS: 10.0000 mg | ORAL_TABLET | Freq: Three times a day (TID) | ORAL | 0 refills | Status: DC | PRN

## 2021-07-23 NOTE — ED Provider Notes (Signed)
[facility] Provider Note    Event Date/Time   First MD Initiated Contact with Patient 07/23/21 1403     (approximate)   History   Leg Pain   HPI  Kelsey Ho is a 31 y.o. female presents emergency department complaining of low back pain radiates to both legs.  Patient has history of chronic pain syndrome.  States she had an overdose from buying pain medicine on the street chest fentanyl in it.  She states no one will prescribe her pain medication and she is in so much pain that she needs narcotics.  She denies any new injuries.  States she has a MRI from [place] that shows bulging disc and compression on the nerve.  Denies loss of bowel or bladder.  Denies abdominal pain.  States she feels that her foot is getting weak.      Physical Exam   Triage Vital Signs: ED Triage Vitals  Enc Vitals Group     BP 07/23/21 1320 (!) 147/104     Pulse Rate 07/23/21 1320 (!) 115     Resp 07/23/21 1320 19     Temp 07/23/21 1320 98 F (36.7 C)     Temp src --      SpO2 07/23/21 1320 100 %     Weight --      Height --      Head Circumference --      Peak Flow --      Pain Score 07/23/21 1318 10     Pain Loc --      Pain Edu? --      Excl. in [place]? --     Most recent vital signs: Vitals:   07/23/21 1500 07/23/21 1705  BP: 132/85 (!) 119/92  Pulse: (!) 119 (!) 107  Resp: 18 18  Temp:    SpO2: 99% 99%     General: Awake, no distress.   CV:  Good peripheral perfusion. regular rate and  rhythm Resp:  Normal effort.  Abd:  No distention.   Other:  Patient has a nodule noted on the right side of the sacrum lumbar area, some weakness in the lower extremities upon dorsiflexion, 5/5 strength in all other range of motion, neurovascular is intact, patient does tremble when trying to straighten and raise legs   ED Results / Procedures / Treatments   Labs (all labs ordered are listed, but only abnormal results are displayed) Labs Reviewed  COMPREHENSIVE  METABOLIC PANEL - Abnormal; Notable for the following components:      Result Value   Glucose, Bld 123 (*)    Calcium 10.7 (*)    Total Protein 8.4 (*)    All other components within normal limits  CBC WITH DIFFERENTIAL/PLATELET - Abnormal; Notable for the following components:   WBC 16.9 (*)    RBC 5.25 (*)    Neutro Abs 15.4 (*)    Abs Immature Granulocytes 0.12 (*)    All other components within normal limits  URINALYSIS, ROUTINE W REFLEX MICROSCOPIC - Abnormal; Notable for the following components:   Color, Urine YELLOW (*)    APPearance HAZY (*)    Ketones, ur 80 (*)    Protein, ur 30 (*)    Bacteria, UA RARE (*)    All other components within normal limits  TSH     EKG     RADIOLOGY CT of the head, MRI lumbar spine    PROCEDURES:   Procedures   MEDICATIONS ORDERED  IN ED: Medications  lidocaine (LIDODERM) 5 % 1 patch (1 patch Transdermal Patch Applied 07/23/21 1702)  sodium chloride 0.9 % bolus 1,000 mL (1,000 mLs Intravenous New Bag/Given 07/23/21 1503)  ketorolac (TORADOL) 30 MG/ML injection 30 mg (30 mg Intravenous Given 07/23/21 1503)  LORazepam (ATIVAN) injection 0.5 mg (0.5 mg Intravenous Given 07/23/21 1603)     IMPRESSION / MDM / ASSESSMENT AND PLAN / ED COURSE  I reviewed the triage vital signs and the nursing notes.                              Differential diagnosis includes, but is not limited to, cauda equina, bulging disc, nerve compression, brain mass, opioid addiction  Patient's presentation is most consistent with exacerbation of chronic illness.   Pain since CBC has elevated WBC of 16.9 with elevated neutrophils of 15.4, TSH comprehensive metabolic panel both normal, urinalysis normal  MRI of the lumbar spine does not show any new changes, interpreted by me as being normal  CT of the head was interpreted by me as being normal, confirmed by radiology  Patient did have some relief with the Toradol IV and Lidoderm patch.  However states  she still is hurting very badly.  Pupils are not dilated.  I did explain all findings to the patient and her mother.  And recommend she follow-up with [facility] spine center for Dr. Cari Caraway at [facility].  Explained to her did not see anything surgical that needs immediate attention on her MRI.  She states she understands.  Patient was discharged in stable condition in the care of her mother.       FINAL CLINICAL IMPRESSION(S) / ED DIAGNOSES   Final diagnoses:  Acute exacerbation of chronic low back pain     Rx / DC Orders   ED Discharge Orders          Ordered    ketorolac (TORADOL) 10 MG tablet  Every 6 hours PRN        07/23/21 1758    cyclobenzaprine (FLEXERIL) 10 MG tablet  3 times daily PRN        07/23/21 1758             Note:  This document was prepared using Dragon voice recognition software and may include unintentional dictation errors.    Versie Starks, PA-C 07/23/21 Darlin Coco, MD 07/25/21 [phone]

## 2021-07-23 NOTE — ED Triage Notes (Signed)
Pt comes with bilateral leg pain and some numbness. Pt states back pain as well. Pt states she was seen here and evaluated. Pt states she just doesn't know what to do. Pt states severe pain. Pt states she is not eating and sleeping due to this. Pt states shaking. Pt states no relief with medications.  Pt states she was flagged last time because she bought off the street. Pt states she just moved here few months ago. Pt states she is trying to do it right but needs something to help with the pain.

## 2021-07-23 NOTE — Discharge Instructions (Signed)
[facility] Address: [address], [place], [place] [address] Hours:  Open ? Closes 7?PM Confirmed by phone call 3 weeks ago Phone: [phone]

## 2021-07-25 DIAGNOSIS — M545 Low back pain, unspecified: Secondary | ICD-10-CM | POA: Diagnosis not present

## 2021-07-29 DIAGNOSIS — M79605 Pain in left leg: Secondary | ICD-10-CM | POA: Diagnosis not present

## 2021-07-29 DIAGNOSIS — M25562 Pain in left knee: Secondary | ICD-10-CM | POA: Diagnosis not present

## 2021-07-29 DIAGNOSIS — M79604 Pain in right leg: Secondary | ICD-10-CM | POA: Diagnosis not present

## 2021-07-29 DIAGNOSIS — M545 Low back pain, unspecified: Secondary | ICD-10-CM | POA: Insufficient documentation

## 2021-07-29 NOTE — ED Triage Notes (Signed)
Pt presents via POV c/o "chronic leg pain" x5 months. Reports does not know cause of pain. Ambulatory to triage.

## 2021-07-30 ENCOUNTER — Emergency Department: Payer: 59

## 2021-07-30 ENCOUNTER — Encounter: Payer: Self-pay | Admitting: Radiology

## 2021-07-30 ENCOUNTER — Emergency Department
Admission: EM | Admit: 2021-07-30 | Discharge: 2021-07-30 | Disposition: A | Discharge status: Home / Self Care | Payer: 59 | Attending: Emergency Medicine | Admitting: Emergency Medicine

## 2021-07-30 DIAGNOSIS — M25562 Pain in left knee: Secondary | ICD-10-CM

## 2021-07-30 DIAGNOSIS — R69 Illness, unspecified: Secondary | ICD-10-CM | POA: Diagnosis not present

## 2021-07-30 DIAGNOSIS — M79605 Pain in left leg: Secondary | ICD-10-CM | POA: Diagnosis not present

## 2021-07-30 LAB — POC URINE PREG, ED: Preg Test, Ur: NEGATIVE

## 2021-07-30 IMAGING — DX DG KNEE COMPLETE 4+V*L*
4 series · 4 of 4 positions shown · non-contrast
Comparison: None Available.

CLINICAL DATA: Left leg pain

EXAM:
LEFT KNEE - COMPLETE 4+ VIEW

[knee ap]
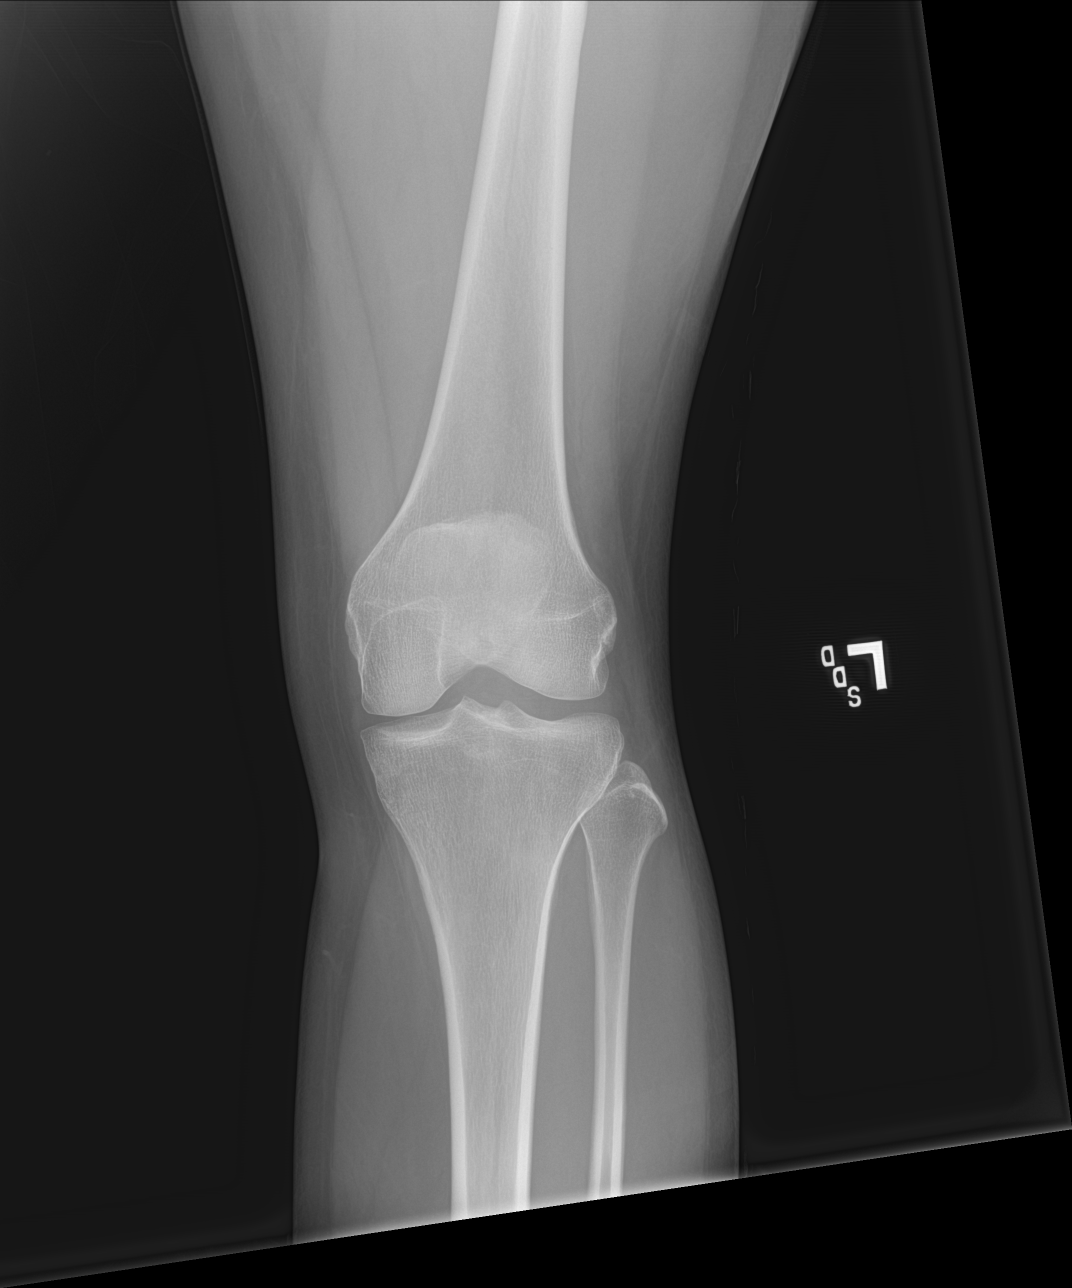

[knee lat]
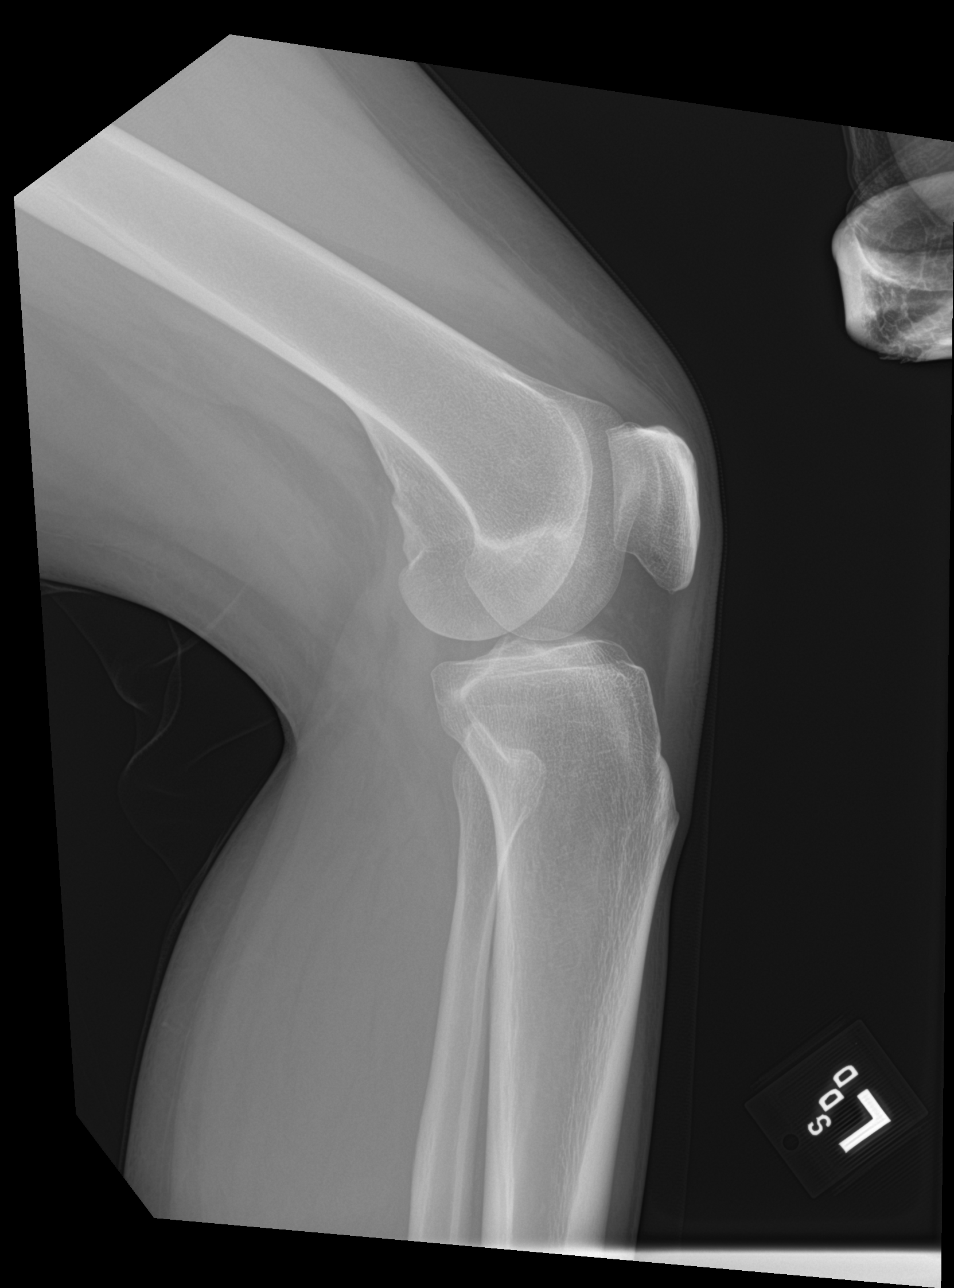

[knee obl (1 of 2)]
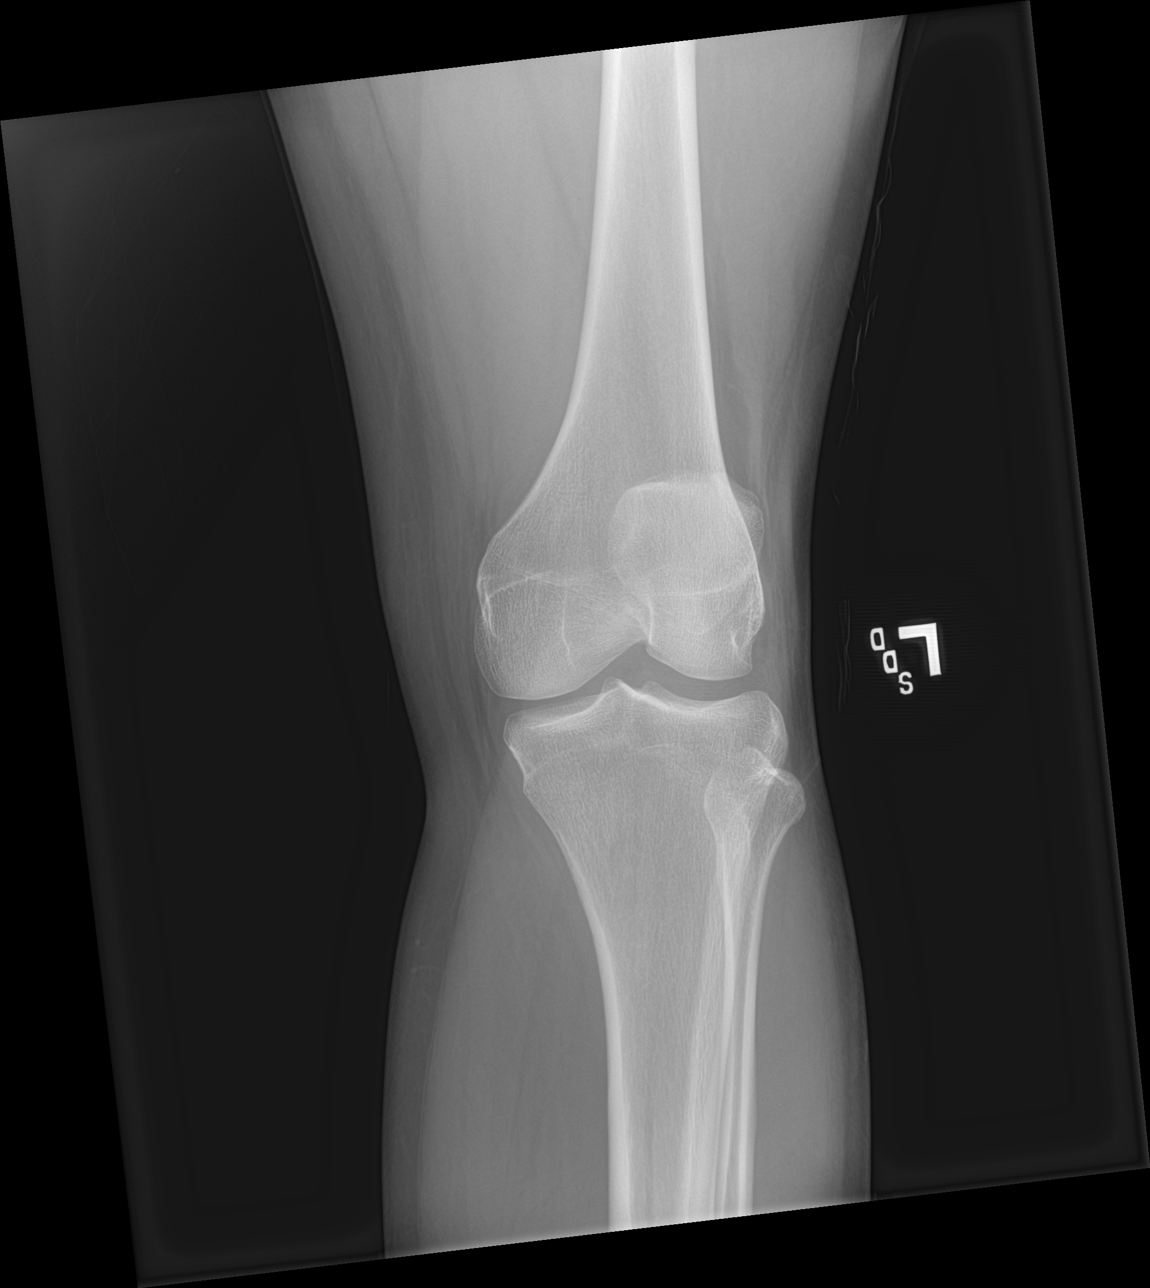

[knee obl (2 of 2)]
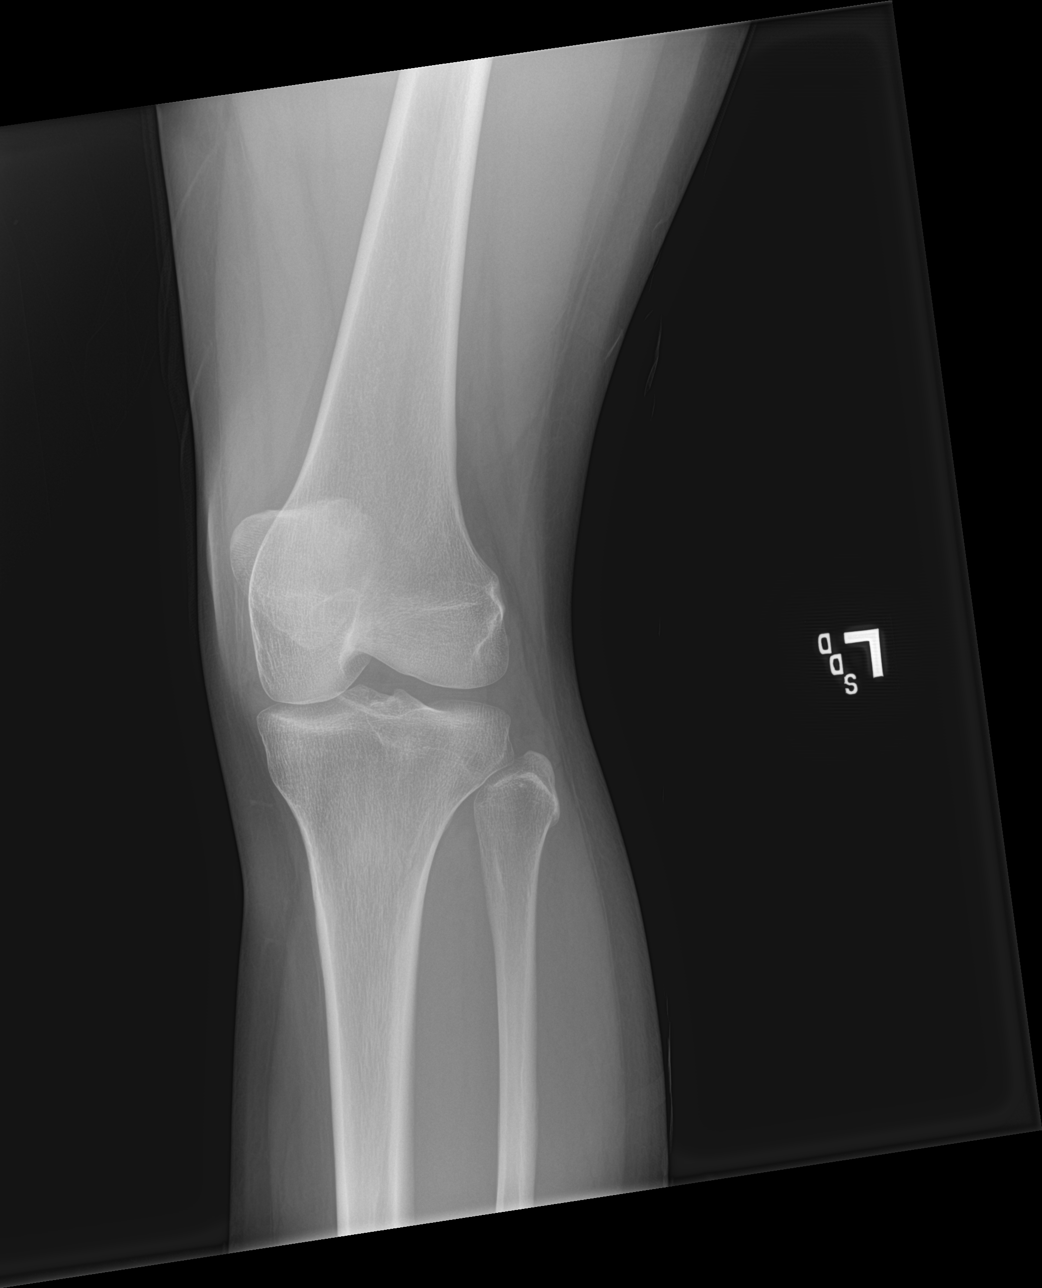

[4 of 4 positions shown; findings below may reference images not displayed]

FINDINGS: No evidence of fracture, dislocation, or joint effusion. No evidence
of arthropathy or other focal bone abnormality. Soft tissues are
unremarkable.
IMPRESSION: Negative.

## 2021-07-30 MED ORDER — IBUPROFEN 800 MG PO TABS
800.0000 mg | ORAL_TABLET | Freq: Once | ORAL | Status: DC
Start: 2021-07-30 — End: 2021-07-30
  Filled 2021-07-30: qty 1

## 2021-07-30 MED ORDER — KETOROLAC TROMETHAMINE 60 MG/2ML IM SOLN
30.0000 mg | Freq: Once | INTRAMUSCULAR | Status: AC
  Administered 2021-07-30: 30 mg via INTRAMUSCULAR
  Filled 2021-07-30: qty 2

## 2021-07-30 MED ORDER — CYCLOBENZAPRINE HCL 10 MG PO TABS
10.0000 mg | ORAL_TABLET | Freq: Once | ORAL | Status: AC
  Administered 2021-07-30: 10 mg via ORAL
  Filled 2021-07-30: qty 1

## 2021-07-30 NOTE — ED Provider Notes (Signed)
[facility] Provider Note    Event Date/Time   First MD Initiated Contact with Patient 07/30/21 [phone]     (approximate)   History   Leg Pain   HPI  Kelsey Ho is a 31 y.o. female with a history of chronic pain prior accidental overdose on fentanyl who presents for evaluation of left knee pain.  Patient reports that she has had pain for a while with both her legs and her back.  Over the last 2 days she has had worsening pain of the left knee.  She reports that the pain is worse with weightbearing.  She denies any trauma, redness, swelling.     Past Medical History:  Diagnosis Date   Pinched nerve     History reviewed. No pertinent surgical history.   Physical Exam   Triage Vital Signs: ED Triage Vitals  Enc Vitals Group     BP 07/29/21 2212 103/71     Pulse Rate 07/29/21 2212 (!) 102     Resp 07/29/21 2212 14     Temp 07/29/21 2212 98.5 F (36.9 C)     Temp Source 07/29/21 2212 Oral     SpO2 07/29/21 2212 98 %     Weight 07/29/21 2213 130 lb (59 kg)     Height 07/29/21 2213 5\' 1"  (1.549 m)     Head Circumference --      Peak Flow --      Pain Score 07/29/21 2212 8     Pain Loc --      Pain Edu? --      Excl. in [place]? --     Most recent vital signs: Vitals:   07/29/21 2212 07/30/21 0509  BP: 103/71 (!) 126/92  Pulse: (!) 102 76  Resp: 14 18  Temp: 98.5 F (36.9 C) 98.4 F (36.9 C)  SpO2: 98% 100%     Constitutional: Alert and oriented. Well appearing and in no apparent distress. HEENT:      Head: Normocephalic and atraumatic.         Eyes: Conjunctivae are normal. Sclera is non-icteric.       Mouth/Throat: Mucous membranes are moist.       Neck: Supple with no signs of meningismus. Cardiovascular: Regular rate and rhythm.  Respiratory: Normal respiratory effort. L Musculoskeletal:  No edema, cyanosis, or erythema of extremities.  Full painless range of motion of all joints in the left lower extremity, normal strong PT and DP  pulses  Neurologic: Normal speech and language. Face is symmetric. Moving all extremities. No gross focal neurologic deficits are appreciated. Skin: Skin is warm, dry and intact. No rash noted. Psychiatric: Mood and affect are normal. Speech and behavior are normal.  ED Results / Procedures / Treatments   Labs (all labs ordered are listed, but only abnormal results are displayed) Labs Reviewed  POC URINE PREG, ED     EKG  none   RADIOLOGY I, 09/29/21, attending MD, have personally viewed and interpreted the images obtained during this visit as below:  XR negative   ___________________________________________________ Interpretation by Radiologist:  DG Knee Complete 4 Views Left  Result Date: 07/30/2021 CLINICAL DATA:  Left leg pain EXAM: LEFT KNEE - COMPLETE 4+ VIEW COMPARISON:  None Available. FINDINGS: No evidence of fracture, dislocation, or joint effusion. No evidence of arthropathy or other focal bone abnormality. Soft tissues are unremarkable. IMPRESSION: Negative. Electronically Signed   By: 09/29/2021 M.D.   On: 07/30/2021 03:45  PROCEDURES:  Critical Care performed: No  Procedures    IMPRESSION / MDM / ASSESSMENT AND PLAN / ED COURSE  I reviewed the triage vital signs and the nursing notes.  31 y.o. female with a history of chronic pain prior accidental overdose on fentanyl who presents for evaluation of left knee pain.  On exam patient has mild tenderness to palpation on the medial and lateral aspect of the knee with no deformity, no swelling, no erythema, no bony abnormalities.  On exam there is no signs of cellulitis, inflammatory or infectious arthritis, limb ischemia, trauma/dislocation/fracture.  Possibly arthritis versus knee sprain.  X-ray showed no acute findings.  Patient is PERC negative for DVT and her pain is pretty localized to the knee therefore an ultrasound was done.  We discussed supportive care follow-up with orthopedics for an MRI  if symptoms do not get improved to rule out a ligament or tendon injury  MEDICATIONS GIVEN IN ED: Medications  cyclobenzaprine (FLEXERIL) tablet 10 mg (10 mg Oral Given 07/30/21 0256)  ketorolac (TORADOL) injection 30 mg (30 mg Intramuscular Given 07/30/21 0430)    EMR reviewed including 2 MRIs done this year for back pain, last visit with neurology Dr. Malvin Johns from April 2023 for chronic pain    FINAL CLINICAL IMPRESSION(S) / ED DIAGNOSES   Final diagnoses:  Left knee pain, unspecified chronicity     Rx / DC Orders   ED Discharge Orders     None        Note:  This document was prepared using Dragon voice recognition software and may include unintentional dictation errors.   Please note:  Patient was evaluated in Emergency Department today for the symptoms described in the history of present illness. Patient was evaluated in the context of the global COVID-19 pandemic, which necessitated consideration that the patient might be at risk for infection with the SARS-CoV-2 virus that causes COVID-19. Institutional protocols and algorithms that pertain to the evaluation of patients at risk for COVID-19 are in a state of rapid change based on information released by regulatory bodies including the CDC and federal and state organizations. These policies and algorithms were followed during the patient's care in the ED.  Some ED evaluations and interventions may be delayed as a result of limited staffing during the pandemic.       Don Perking, [place], MD 07/30/21 [phone]

## 2021-07-30 NOTE — ED Notes (Signed)
Pt urine was accidentally sent without label by this tech, RN notified of accident. POC preg was completed and was NEG. Lab stated if new urine tests were ordered, a new sample would need to be sent, but at this time, the POC was the only order right now. RN notified by this tech of such.

## 2021-08-01 DIAGNOSIS — R69 Illness, unspecified: Secondary | ICD-10-CM | POA: Diagnosis not present

## 2021-08-06 DIAGNOSIS — R69 Illness, unspecified: Secondary | ICD-10-CM | POA: Diagnosis not present

## 2021-08-13 DIAGNOSIS — R69 Illness, unspecified: Secondary | ICD-10-CM | POA: Diagnosis not present

## 2021-08-14 DIAGNOSIS — R300 Dysuria: Secondary | ICD-10-CM | POA: Diagnosis not present

## 2021-08-14 DIAGNOSIS — M79605 Pain in left leg: Secondary | ICD-10-CM | POA: Diagnosis not present

## 2021-08-14 DIAGNOSIS — M79604 Pain in right leg: Secondary | ICD-10-CM | POA: Diagnosis not present

## 2021-08-20 DIAGNOSIS — R69 Illness, unspecified: Secondary | ICD-10-CM | POA: Diagnosis not present

## 2021-09-24 DIAGNOSIS — R69 Illness, unspecified: Secondary | ICD-10-CM | POA: Diagnosis not present

## 2021-10-09 DIAGNOSIS — M791 Myalgia, unspecified site: Secondary | ICD-10-CM | POA: Diagnosis not present

## 2021-10-09 DIAGNOSIS — M130 Polyarthritis, unspecified: Secondary | ICD-10-CM | POA: Diagnosis not present

## 2021-10-09 DIAGNOSIS — R5382 Chronic fatigue, unspecified: Secondary | ICD-10-CM | POA: Diagnosis not present

## 2021-10-09 DIAGNOSIS — M2550 Pain in unspecified joint: Secondary | ICD-10-CM | POA: Diagnosis not present

## 2021-10-09 DIAGNOSIS — Z79899 Other long term (current) drug therapy: Secondary | ICD-10-CM | POA: Diagnosis not present

## 2021-10-09 DIAGNOSIS — R69 Illness, unspecified: Secondary | ICD-10-CM | POA: Diagnosis not present

## 2021-10-22 ENCOUNTER — Other Ambulatory Visit: Payer: Self-pay

## 2021-10-22 ENCOUNTER — Encounter: Payer: Self-pay | Admitting: *Deleted

## 2021-10-22 ENCOUNTER — Emergency Department
Admission: EM | Admit: 2021-10-22 | Discharge: 2021-10-22 | Disposition: A | Discharge status: Home / Self Care | Payer: 59 | Attending: Emergency Medicine | Admitting: Emergency Medicine

## 2021-10-22 DIAGNOSIS — R69 Illness, unspecified: Secondary | ICD-10-CM | POA: Diagnosis not present

## 2021-10-22 DIAGNOSIS — G8929 Other chronic pain: Secondary | ICD-10-CM | POA: Insufficient documentation

## 2021-10-22 DIAGNOSIS — R519 Headache, unspecified: Secondary | ICD-10-CM | POA: Diagnosis not present

## 2021-10-22 DIAGNOSIS — F411 Generalized anxiety disorder: Secondary | ICD-10-CM

## 2021-10-22 DIAGNOSIS — M25561 Pain in right knee: Secondary | ICD-10-CM | POA: Insufficient documentation

## 2021-10-22 DIAGNOSIS — F419 Anxiety disorder, unspecified: Secondary | ICD-10-CM | POA: Insufficient documentation

## 2021-10-22 DIAGNOSIS — G43809 Other migraine, not intractable, without status migrainosus: Secondary | ICD-10-CM | POA: Insufficient documentation

## 2021-10-22 DIAGNOSIS — F5104 Psychophysiologic insomnia: Secondary | ICD-10-CM | POA: Diagnosis not present

## 2021-10-22 DIAGNOSIS — M25562 Pain in left knee: Secondary | ICD-10-CM | POA: Diagnosis not present

## 2021-10-22 MED ORDER — TOPIRAMATE 25 MG PO TABS: 75.0000 mg | ORAL_TABLET | Freq: Every day | ORAL | 0 refills | Status: DC

## 2021-10-22 MED ORDER — CYCLOBENZAPRINE HCL 10 MG PO TABS: 10.0000 mg | ORAL_TABLET | Freq: Three times a day (TID) | ORAL | 0 refills | Status: DC | PRN

## 2021-10-22 MED ORDER — CYCLOBENZAPRINE HCL 10 MG PO TABS
10.0000 mg | ORAL_TABLET | Freq: Once | ORAL | Status: AC
  Administered 2021-10-22: 10 mg via ORAL
  Filled 2021-10-22: qty 1

## 2021-10-22 NOTE — ED Provider Notes (Signed)
[facility] Provider Note   Event Date/Time   First MD Initiated Contact with Patient 10/22/21 1840     (approximate) History  Headache and Emesis  HPI Kelsey Ho is a 31 y.o. female with a past medical history of chronic pain syndrome, chronic migraines, chronic back pain, and chronic bilateral lower extremity pain who presents for multiple complaints including daily headaches, vomiting, and bilateral knee pain.  Patient explains that initially her bilateral lower extremity pain was from her lower back all the way into her toes however now it is focused just on bilateral knees.  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  Patient states that she has a personal and family history of migraines however she has never been treated for these in the past and has them daily with associated photophobia and nausea/vomiting.  Patient states that she is concerned as she has lost 15-20 pounds over the last 6 months unintentionally.  Patient states that these headaches are daily and she denies any exacerbating or relieving factors. ROS: Patient currently denies any vision changes, tinnitus, difficulty speaking, facial droop, sore throat, chest pain, shortness of breath, abdominal pain, nausea/vomiting/diarrhea, dysuria, or weakness/numbness/paresthesias in any extremity   Physical Exam  Triage Vital Signs: ED Triage Vitals  Enc Vitals Group     BP 10/22/21 1744 (!) 124/98     Pulse Rate 10/22/21 1744 (!) 122     Resp 10/22/21 1744 20     Temp 10/22/21 1744 98.7 F (37.1 C)     Temp Source 10/22/21 1744 Oral     SpO2 10/22/21 1744 99 %     Weight 10/22/21 1745 118 lb (53.5 kg)     Height 10/22/21 1745 5\' 1"  (1.549 m)     Head Circumference --      Peak Flow --      Pain Score 10/22/21 1745 7     Pain Loc --      Pain Edu? --      Excl. in [place]? --    Most  recent vital signs: Vitals:   10/22/21 1744  BP: (!) 124/98  Pulse: (!) 122  Resp: 20  Temp: 98.7 F (37.1 C)  SpO2: 99%   General: Awake, oriented x4. CV:  Good peripheral perfusion.  Resp:  Normal effort.  Abd:  No distention.  Other:  Middle-aged Caucasian female sitting on stretcher in no acute distress ED Results / Procedures / Treatments   PROCEDURES: Critical Care performed: No Procedures MEDICATIONS ORDERED IN ED: Medications  cyclobenzaprine (FLEXERIL) tablet 10 mg (10 mg Oral Given 10/22/21 1954)   IMPRESSION / MDM / ASSESSMENT AND PLAN / ED COURSE  I reviewed the triage vital signs and the nursing notes.                             The patient is on the cardiac monitor to evaluate for evidence of arrhythmia and/or significant heart rate changes. Patient's presentation is most consistent with acute presentation with potential threat to life or bodily function. Patient a 31 year old female with the above-stated past medical history presents for multiple complaints including daily headaches with associated nausea/vomiting as well as bilateral knee pain that is exacerbated by her profession.  These complaints are all chronic and through chart review, patient has been trialed on  multiple medications including seeing pain clinic for her bilateral lower extremity pain due to a bulging disc in her lower back.  Patient states that she has been seen by pain management in the past and wanted her to start Suboxone however since she has had issues with opiate abuse in the past, she does not want to take this medication as she feels that it will reflect poorly on her as well as may make her crave opiates again.  Patient encouraged to follow-up with pain management as well as with neurosurgery for other options.  Shows no red flag symptomatology of cord compression including no saddle anesthesia, bowel/bladder incontinence, or inability to ambulate.  Will refill Flexeril  prescription  Patient's migraines are chronic and daily for her for which she normally takes Tylenol.  Patient takes five 500 mg Tylenol daily.  Upon chart review, patient has been trialed on topiramate in the past however since she took this with Seroquel and she had nightmares, she stopped both.  Patient was encouraged to refill a new prescription for topiramate that may be helping with her migraines daily.  Patient also states that she has Zofran at home for her chronic nausea/vomiting.  Patient also complains that the symptoms are causing insomnia and the feeling that she "just cannot turn my brain off".  Patient is currently seen at [facility] and states that they "just are not helping". Will refill topiramate prescription.  Follow-up: [facility]  Dispo: Discharge home with neurosurgical and psychological follow-up   FINAL CLINICAL IMPRESSION(S) / ED DIAGNOSES   Final diagnoses:  Other migraine without status migrainosus, not intractable  Chronic pain of both knees  Anxiety state  Psychophysiological insomnia   Rx / DC Orders   ED Discharge Orders          Ordered    topiramate (TOPAMAX) 25 MG tablet  Daily at bedtime        10/22/21 1934    cyclobenzaprine (FLEXERIL) 10 MG tablet  3 times daily PRN        10/22/21 1937           Note:  This document was prepared using Dragon voice recognition software and may include unintentional dictation errors.   Merwyn Katos, MD 10/22/21 2021

## 2021-10-22 NOTE — ED Triage Notes (Signed)
Pt has a headache and vomiting with anxiety.  Pt reports sx for several months.  Denies SI or HI.  Pt denies drugs or etoh   pt alert  speech clear.

## 2021-10-22 NOTE — ED Notes (Signed)
Provider Bradler at bedside.

## 2021-10-22 NOTE — ED Notes (Addendum)
See triage note. Pt reports migraines run in the family. Pt reports chronic bilateral knee and L ankle pain. Reports no one can figure out what is wrong. Reports insomnia. Reports previously lived in [place] and was on hydrocodone 30mg  and xanax 2mg . Reports had scans done at [facility] as well as blood and was told scans negative and haven't heard back yet about bloodwork. Pt reports scans done in confirm she needs surgery.

## 2021-10-24 ENCOUNTER — Other Ambulatory Visit: Payer: Self-pay

## 2021-10-24 ENCOUNTER — Emergency Department
Admission: EM | Admit: 2021-10-24 | Discharge: 2021-10-24 | Disposition: A | Discharge status: Home / Self Care | Payer: 59 | Attending: Emergency Medicine | Admitting: Emergency Medicine

## 2021-10-24 ENCOUNTER — Encounter: Payer: Self-pay | Admitting: Family

## 2021-10-24 ENCOUNTER — Encounter: Payer: Self-pay | Admitting: Emergency Medicine

## 2021-10-24 ENCOUNTER — Emergency Department: Payer: 59

## 2021-10-24 ENCOUNTER — Ambulatory Visit (INDEPENDENT_AMBULATORY_CARE_PROVIDER_SITE_OTHER): Payer: 59 | Admitting: Family

## 2021-10-24 VITALS — BP 118/80 | HR 144 | Temp 98.6°F | Resp 16 | Ht 61.0 in | Wt 117.2 lb

## 2021-10-24 DIAGNOSIS — R000 Tachycardia, unspecified: Secondary | ICD-10-CM | POA: Diagnosis not present

## 2021-10-24 DIAGNOSIS — M2550 Pain in unspecified joint: Secondary | ICD-10-CM | POA: Diagnosis not present

## 2021-10-24 DIAGNOSIS — F411 Generalized anxiety disorder: Secondary | ICD-10-CM | POA: Insufficient documentation

## 2021-10-24 DIAGNOSIS — F1494 Cocaine use, unspecified with cocaine-induced mood disorder: Secondary | ICD-10-CM

## 2021-10-24 DIAGNOSIS — F1111 Opioid abuse, in remission: Secondary | ICD-10-CM | POA: Diagnosis not present

## 2021-10-24 DIAGNOSIS — R0789 Other chest pain: Secondary | ICD-10-CM | POA: Diagnosis not present

## 2021-10-24 DIAGNOSIS — I471 Supraventricular tachycardia: Secondary | ICD-10-CM | POA: Diagnosis not present

## 2021-10-24 DIAGNOSIS — F1110 Opioid abuse, uncomplicated: Secondary | ICD-10-CM | POA: Diagnosis not present

## 2021-10-24 DIAGNOSIS — F419 Anxiety disorder, unspecified: Secondary | ICD-10-CM | POA: Diagnosis not present

## 2021-10-24 DIAGNOSIS — R079 Chest pain, unspecified: Secondary | ICD-10-CM | POA: Diagnosis not present

## 2021-10-24 DIAGNOSIS — R69 Illness, unspecified: Secondary | ICD-10-CM | POA: Diagnosis not present

## 2021-10-24 LAB — BASIC METABOLIC PANEL
Anion gap: 15 (ref 5–15)
BUN: 11 mg/dL (ref 6–20)
CO2: 28 mmol/L (ref 22–32)
Calcium: 10.2 mg/dL (ref 8.9–10.3)
Chloride: 100 mmol/L (ref 98–111)
Creatinine, Ser: 0.92 mg/dL (ref 0.44–1.00)
GFR, Estimated: >60 mL/min (ref >60)
Glucose, Bld: 109 mg/dL — ABNORMAL HIGH (ref 70–99)
Potassium: 4.1 mmol/L (ref 3.5–5.1)
Sodium: 143 mmol/L (ref 135–145)

## 2021-10-24 LAB — URINE DRUG SCREEN, QUALITATIVE (ARMC ONLY)
Amphetamines, Ur Screen: NOT DETECTED
Barbiturates, Ur Screen: NOT DETECTED
Benzodiazepine, Ur Scrn: POSITIVE — AB
Cannabinoid 50 Ng, Ur ~~LOC~~: NOT DETECTED
Cocaine Metabolite,Ur ~~LOC~~: POSITIVE — AB
MDMA (Ecstasy)Ur Screen: NOT DETECTED
Methadone Scn, Ur: NOT DETECTED
Opiate, Ur Screen: NOT DETECTED
Phencyclidine (PCP) Ur S: NOT DETECTED
Tricyclic, Ur Screen: POSITIVE — AB

## 2021-10-24 LAB — D-DIMER, QUANTITATIVE: D-Dimer, Quant: 0.38 ug/mL-FEU (ref 0.00–0.50)

## 2021-10-24 LAB — CBC
HCT: 46.9 % — ABNORMAL HIGH (ref 36.0–46.0)
Hemoglobin: 15.5 g/dL — ABNORMAL HIGH (ref 12.0–15.0)
MCH: 28.2 pg (ref 26.0–34.0)
MCHC: 33 g/dL (ref 30.0–36.0)
MCV: 85.3 fL (ref 80.0–100.0)
Platelets: 452 10*3/uL — ABNORMAL HIGH (ref 150–400)
RBC: 5.5 MIL/uL — ABNORMAL HIGH (ref 3.87–5.11)
RDW: 13.1 % (ref 11.5–15.5)
WBC: 11.3 10*3/uL — ABNORMAL HIGH (ref 4.0–10.5)
nRBC: 0 % (ref 0.0–0.2)

## 2021-10-24 LAB — TROPONIN I (HIGH SENSITIVITY): Troponin I (High Sensitivity): 2 ng/L (ref <18)

## 2021-10-24 LAB — POC URINE PREG, ED: Preg Test, Ur: NEGATIVE

## 2021-10-24 MED ORDER — KETOROLAC TROMETHAMINE 15 MG/ML IJ SOLN
15.0000 mg | Freq: Once | INTRAMUSCULAR | Status: AC
  Administered 2021-10-24: 15 mg via INTRAVENOUS
  Filled 2021-10-24: qty 1

## 2021-10-24 MED ORDER — ACETAMINOPHEN 500 MG PO TABS
1000.0000 mg | ORAL_TABLET | Freq: Once | ORAL | Status: AC
  Administered 2021-10-24: 1000 mg via ORAL
  Filled 2021-10-24: qty 2

## 2021-10-24 MED ORDER — LACTATED RINGERS IV BOLUS
1000.0000 mL | Freq: Once | INTRAVENOUS | Status: AC
  Administered 2021-10-24: 1000 mL via INTRAVENOUS

## 2021-10-24 NOTE — ED Notes (Signed)
31 yof with a c/c of anxiety and lower back pain. The pt advised she thinks her anxiety is the cause of her elevated heart rate. The pt is warm, pink, and dry. Alert and oriented x 4.

## 2021-10-24 NOTE — ED Triage Notes (Signed)
Patient sent to ED from [facility] for abnormal EKG. Patient states that's she was originally at doctors office to "get medications in order." Patient states she has anxiety and is feeling a tightness in chest because she has not taken her xanax.

## 2021-10-24 NOTE — ED Provider Notes (Signed)
[facility] Provider Note    Event Date/Time   First MD Initiated Contact with Patient 10/24/21 1605     (approximate)   History   Abnormal ECG   HPI  Kelsey Ho is a 31 y.o. female past medical history of anxiety, chronic pain, opiate use disorder who presents due to concern for abnormal EKG.  Patient was at her PCPs office to establish care today when she started complaining of anxiety and chest tightness.  Apparently she was told that her EKG was abnormal and she should come to the emergency department.  Patient tells me that she felt extremely anxious at the office because they were in a small room.  She tells me she has severe anxiety at baseline and takes Xanax daily.  When she lived in [place] she was prescribed 2 mg of Xanax 4 times a day.  She was then buying off the street for some time.  Moved to [place] about 7 months ago and is currently prescribed 0.5 mg Xanax 4 times a day.  This was last filled on 8/21 and she only has 3 pills left today.  She is stressed because she is now having to count her pills.  She endorses chest pain that she has had on and off for the last 7 months as well shortness of breath.  Not significantly abnormal for her today.  She attributes this to her anxiety.  Denies lower extremity swelling.  She is on OCPs for birth control.  Denies other drug or alcohol use currently.  Past Medical History:  Diagnosis Date   Pinched nerve     Patient Active Problem List   Diagnosis Date Noted   Opioid abuse, in remission (HCC) 10/24/2021   Insomnia 06/11/2021   Chronic pain 06/11/2021   Anxiety 06/11/2021     Physical Exam  Triage Vital Signs: ED Triage Vitals  Enc Vitals Group     BP 10/24/21 1517 (!) 134/91     Pulse Rate 10/24/21 1517 (!) 146     Resp 10/24/21 1517 18     Temp 10/24/21 1517 98.3 F (36.8 C)     Temp Source 10/24/21 1517 Oral     SpO2 10/24/21 1517 97 %     Weight --      Height --      Head  Circumference --      Peak Flow --      Pain Score 10/24/21 1514 7     Pain Loc --      Pain Edu? --      Excl. in GC? --     Most recent vital signs: Vitals:   10/24/21 1517 10/24/21 1704  BP: (!) 134/91 115/82  Pulse: (!) 146 (!) 112  Resp: 18 18  Temp: 98.3 F (36.8 C)   SpO2: 97% 99%     General: Awake, no distress.  CV:  Good peripheral perfusion.  Tachycardic, regular rhythm Resp:  Normal effort.  Lungs are clear no increased work of breathing Abd:  No distention.  Neuro:             Awake, Alert, Oriented x 3  Other:  No lower extremity edema or asymmetry   ED Results / Procedures / Treatments  Labs (all labs ordered are listed, but only abnormal results are displayed) Labs Reviewed  BASIC METABOLIC PANEL - Abnormal; Notable for the following components:      Result Value   Glucose, Bld 109 (*)  All other components within normal limits  CBC - Abnormal; Notable for the following components:   WBC 11.3 (*)    RBC 5.50 (*)    Hemoglobin 15.5 (*)    HCT 46.9 (*)    Platelets 452 (*)    All other components within normal limits  URINE DRUG SCREEN, QUALITATIVE ([facility] ONLY) - Abnormal; Notable for the following components:   Tricyclic, Ur Screen POSITIVE (*)    Cocaine Metabolite,Ur [place] POSITIVE (*)    Benzodiazepine, Ur Scrn POSITIVE (*)    All other components within normal limits  D-DIMER, QUANTITATIVE  POC URINE PREG, ED  TROPONIN I (HIGH SENSITIVITY)  TROPONIN I (HIGH SENSITIVITY)     EKG  EKG interpreted myself shows sinus tachycardia right axis deviation no acute ischemic changes   RADIOLOGY I reviewed and interpreted the CXR which does not show any acute cardiopulmonary process    PROCEDURES:  Critical Care performed: No  .1-3 Lead EKG Interpretation  Performed by: Georga Hacking, MD Authorized by: Georga Hacking, MD     Interpretation: abnormal     ECG rate assessment: tachycardic     Rhythm: sinus tachycardia     Ectopy:  none     Conduction: normal     The patient is on the cardiac monitor to evaluate for evidence of arrhythmia and/or significant heart rate changes.   MEDICATIONS ORDERED IN ED: Medications  lactated ringers bolus 1,000 mL (1,000 mLs Intravenous New Bag/Given 10/24/21 1655)  acetaminophen (TYLENOL) tablet 1,000 mg (1,000 mg Oral Given 10/24/21 1713)  ketorolac (TORADOL) 15 MG/ML injection 15 mg (15 mg Intravenous Given 10/24/21 1714)     IMPRESSION / MDM / ASSESSMENT AND PLAN / ED COURSE  I reviewed the triage vital signs and the nursing notes.                              Patient's presentation is most consistent with acute presentation with potential threat to life or bodily function.  Differential diagnosis includes, but is not limited to, anxiety, benzodiazepine withdrawal, pulmonary embolism, hyperthyroidism, less likely acute coronary syndrome, pneumothorax  Patient presents primarily because she was referred here from urgent care due to abnormal EKG.  Patient was at a routine appointment to initiate care with a new PCP when she complained of chest tightness and anxiety and was told she had an abnormal EKG and she should come to the emergency department.  Patient has been dealing with chronic anxiety and chronic benzodiazepine use as well as chronic pain.  She is prescribed 0.5 mg of Xanax 4 times per day this was last filled 10 days ago and she only has 3 pills left so has been taking much more frequently than prescribed.  Says her anxiety is out of control and that no other medicines have helped in the past other than Xanax.  She endorses chronic chest pain shortness of breath so what she felt in the office today was not unlike what she has many days since moving here 7 months ago.  She denies cough fevers chills.  Denies other drug or alcohol use.  She is not currently using opiates.  She is quite tachycardic initial heart rates in the 140s, by the time of my evaluation heart rate down  to the 120s but when we start talking about her Xanax it does go up to the 130s.  Patient's EKG shows sinus tachycardia with a right axis  deviation but otherwise there are no acute ischemic changes.  Looked back on recent ED visits and patient is persistently tachycardic with heart rates really never below the 1 teens.  It is a bit higher than it has been today.  We will give a liter of fluid and check D-dimer.  She has had thyroid studies checked recently troponin is negative we will rule out pregnancy and screen UDS.  Had a long discussion with the patient about the issues with being on benzos chronically on the ultimate goal to taper off however she was very resistant to ever coming off Xanax and says that she will be on this for life.  I will not refill her Xanax today she has an appointment to see her PCP again on Monday.  Pregnancy test is negative.  D-dimer is also negative.  UDS positive for cocaine benzos, and TCAs.  Patient's heart rate did improve to the 110s which is where it seems to have been in the past as well.  Ultimately patient seems to be at her baseline and have low suspicion for acute pathology today.  She is appropriate for discharge. Clinical Course as of 10/24/21 1735  Thu Oct 24, 2021  1701 Potassium: 4.1 [KM]  1731 Preg Test, Ur: Negative [KM]    Clinical Course User Index [KM] Georga Hacking, MD     FINAL CLINICAL IMPRESSION(S) / ED DIAGNOSES   Final diagnoses:  Generalized anxiety disorder  Sinus tachycardia     Rx / DC Orders   ED Discharge Orders     None        Note:  This document was prepared using Dragon voice recognition software and may include unintentional dictation errors.   Georga Hacking, MD 10/24/21 [phone]

## 2021-10-24 NOTE — Discharge Instructions (Signed)
Your blood work was all reassuring today.  Your screen for heart attacks and blood clots in your lungs was negative.  Please follow-up with your primary care provider for further discussion of your Xanax prescription.

## 2021-10-24 NOTE — Progress Notes (Signed)
New Patient Office Visit  Subjective:  Patient ID: Kelsey Ho, female    DOB: Sep 14, 1990  Age: 31 y.o. MRN: [id number]  CC:  Chief Complaint  Patient presents with   Establish Care    HPI Kelsey Ho is here to establish care as a new patient.  Prior provider was: Dr. Gwynneth Aliment her for one appt, and pt states she was dismissed from practice after being seen. Pt is not sure why.  Accompanied by mom. Pt states she moved here with her mom about seven months ago from [place] to try to have a fresh start. She does work 'cam shows' and on only fans. Her mom and her do not live in the same home. Pt lives at home with her dog who she states has been recently sick, which has been causing her increased anxiety as of late.   Pt is very anxious appearing, and mom states she has vomited and with nausea on and off over the last three days. Pt also reports chest pain, describing it as constant in the center of her chest.   06/27/21, went by EMS to hospital for accidental opioid overdose. Had bought fentanyl pill from friend, took one half and then was found by mom was unresponsive. Ems was called, given narcan.   Pt admits to taking a 'dab' of cocaine this am. She states she uses cocaine almost daily due to pain that she describes as constant. She states she is often in pain, and that her job requires her to be very active with strenuous days requiring a lot of use of her muscles. As far as sleep, she states she barely sleeps at all, and is often up all throughout the night. She states her mind is fleeting and scattered, and she is known to jump from one topic to another.    chronic concerns:  H/o opoid abuse: was seen by a doctor in [place] for some time, that has recently had his medical license revoked for over prescribing controlled substances. Pt states she was seen by this medical doctor for years, and this is where she started on opioids for her chronic low back pain. She also  states she started taking benzodiazepines, becoming reliant on alprazolam for controlling her anxiety, which she still takes to this day.   Chronic low back pain, possible herniated disc, went to [facility] where they were trying to get her started on suboxone. She does not want to start on this, as she is worried that she will try to use in access, and or is worried it will increase her cravings for opioids again. She did go to the ER recently, and was recommended to f/u and or consult with neurosurgeon and or pain management for alternative options.   Rheumatology initial assessment 10/09/21 for polyarthralgia ([id number] negative) , diagnosed with fibromyalgia, rheumatology has suggested she f/u in six weeks from appt. She has appt scheduled for 11/21/21.   Past Medical History:  Diagnosis Date   Pinched nerve     Past Surgical History:  Procedure Laterality Date   COSMETIC SURGERY      Family History  Problem Relation Age of Onset   Kidney Stones Mother    Cancer - Lung Maternal Grandmother    Bone cancer Maternal Grandfather     Social History   Socioeconomic History   Marital status: Single    Spouse name: Not on file   Number of children: Not on file   Years  of education: Not on file   Highest education level: Not on file  Occupational History   Not on file  Tobacco Use   Smoking status: Former    Types: Cigarettes   Smokeless tobacco: Never  Vaping Use   Vaping Use: Never used  Substance and Sexual Activity   Alcohol use: Yes    Comment: occ   Drug use: Yes    Types: Marijuana, Cocaine, LSD, Benzodiazepines   Sexual activity: Not Currently    Birth control/protection: Pill  Other Topics Concern   Not on file  Social History Narrative   Not on file   Social Determinants of Health   Financial Resource Strain: Not on file  Food Insecurity: Not on file  Transportation Needs: Not on file  Physical Activity: Not on file  Stress: Not on file  Social  Connections: Not on file  Intimate Partner Violence: Not on file    Outpatient Medications Prior to Visit  Medication Sig Dispense Refill   zolpidem (AMBIEN CR) 12.5 MG CR tablet Take 10 mg by mouth at bedtime as needed.     ALPRAZolam (XANAX) 0.25 MG tablet Take 0.25 mg by mouth 2 (two) times daily as needed.     cyclobenzaprine (FLEXERIL) 10 MG tablet Take 1 tablet (10 mg total) by mouth 3 (three) times daily as needed. 30 tablet 0   LO LOESTRIN FE 1 MG-10 MCG / 10 MCG tablet Take 1 tablet by mouth daily.     cyanocobalamin (,VITAMIN B-12,) 1000 MCG/ML injection Inject into the muscle. (Patient not taking: Reported on 10/24/2021)     escitalopram (LEXAPRO) 5 MG tablet Take 5 mg by mouth daily. (Patient not taking: Reported on 10/24/2021)     gabapentin (NEURONTIN) 300 MG capsule Take 1 capsule (300 mg total) by mouth 3 (three) times daily. 90 capsule 0   hydrOXYzine (ATARAX) 25 MG tablet Take 1 tablet (25 mg total) by mouth 3 (three) times daily as needed. 30 tablet 0   hydrOXYzine (ATARAX) 25 MG tablet Take 1 tablet (25 mg total) by mouth 3 (three) times daily as needed for anxiety. 30 tablet 0   ketorolac (TORADOL) 10 MG tablet Take 1 tablet (10 mg total) by mouth every 6 (six) hours as needed. (Patient not taking: Reported on 10/24/2021) 20 tablet 0   methocarbamol (ROBAXIN) 500 MG tablet Take 500 mg by mouth 3 (three) times daily. (Patient not taking: Reported on 10/24/2021)     nortriptyline (PAMELOR) 10 MG capsule Take 10 mg by mouth 3 (three) times daily. (Patient not taking: Reported on 10/24/2021)     ondansetron (ZOFRAN-ODT) 4 MG disintegrating tablet Take 1 tablet (4 mg total) by mouth every 8 (eight) hours as needed for nausea or vomiting. 15 tablet 0   QUEtiapine (SEROQUEL) 50 MG tablet Take 50-200 mg by mouth at bedtime. Take 50 mg at night for a week then increase to 100 mg at night for a week then increase to 150 mg at night for a week then increase to 200 mg at night and continue  that dose (Patient not taking: Reported on 11/15/2021)     topiramate (TOPAMAX) 25 MG tablet Take 3 tablets (75 mg total) by mouth at bedtime. (Patient not taking: Reported on 10/24/2021) 30 tablet 0   No facility-administered medications prior to visit.    Allergies  Allergen Reactions   Nortriptyline Hcl     Worsening mood        Objective:    Physical Exam  Constitutional:      General: She is not in acute distress.    Appearance: Normal appearance. She is normal weight. She is not ill-appearing, toxic-appearing or diaphoretic.  Cardiovascular:     Rate and Rhythm: Regular rhythm. Tachycardia present.     Pulses: Normal pulses.  Pulmonary:     Effort: Pulmonary effort is normal.     Breath sounds: Normal breath sounds.  Abdominal:     General: Abdomen is flat.  Neurological:     Mental Status: She is alert and oriented to person, place, and time.     Cranial Nerves: Cranial nerves 2-12 are intact. No cranial nerve deficit.     Sensory: Sensation is intact.     Motor: Motor function is intact.     Coordination: Coordination is intact.  Psychiatric:        Attention and Perception: Attention normal.        Mood and Affect: Mood is anxious. Affect is tearful.        Behavior: Behavior is hyperactive. Behavior is cooperative.        Thought Content: Thought content does not include homicidal or suicidal plan.        Cognition and Memory: Memory normal.        Judgment: Judgment is impulsive and inappropriate.       BP 118/80   Pulse (!) 144   Temp 98.6 F (37 C)   Resp 16   Ht 5\' 1"  (1.549 m)   Wt 117 lb 4 oz (53.2 kg)   SpO2 100%   BMI 22.15 kg/m  Wt Readings from Last 3 Encounters:  10/29/21 121 lb 6 oz (55.1 kg)  10/24/21 117 lb 4 oz (53.2 kg)  10/22/21 118 lb (53.5 kg)     Health Maintenance Due  Topic Date Due   COVID-19 Vaccine (1) Never done   HIV Screening  Never done   Hepatitis C Screening  Never done   TETANUS/TDAP  Never done   PAP  SMEAR-Modifier  Never done    There are no preventive care reminders to display for this patient.  Lab Results  Component Value Date   TSH 1.46 10/29/2021   Lab Results  Component Value Date   WBC 11.3 (H) 10/24/2021   HGB 15.5 (H) 10/24/2021   HCT 46.9 (H) 10/24/2021   MCV 85.3 10/24/2021   PLT 452 (H) 10/24/2021   Lab Results  Component Value Date   NA 143 10/24/2021   K 4.1 10/24/2021   CO2 28 10/24/2021   GLUCOSE 109 (H) 10/24/2021   BUN 11 10/24/2021   CREATININE 0.92 10/24/2021   BILITOT 1.1 07/23/2021   ALKPHOS 60 07/23/2021   AST 18 07/23/2021   ALT 15 07/23/2021   PROT 8.4 (H) 07/23/2021   ALBUMIN 5.0 07/23/2021   CALCIUM 10.2 10/24/2021   ANIONGAP 15 10/24/2021   No results found for: "CHOL" No results found for: "HDL" No results found for: "LDLCALC" No results found for: "TRIG" No results found for: "CHOLHDL" No results found for: "HGBA1C"    Assessment & Plan:   Problem List Items Addressed This Visit       Cardiovascular and Mediastinum   RESOLVED: SVT (supraventricular tachycardia) (HCC) - Primary (Chronic)    Today in office hr 140's.  Advised pt this can be life threatening/fatal and pt advised to to go ER. She declines EMS and even states she might not go to ER. She is accompanied by mom who is  also trying to talk pt into going to ER. Advised pt this could result in life threatening situation and or death. Pt aware. Stable upon leaving.      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   Counseled in length on daily use of cocaine. recommendations made for how to seek hep for this, whether with rehab or starting on suboxone or alternative medication for this. Pt is not sure which avenue she  wants to take, and seems rather hesitant to quit at this time. Did advise pt that cocaine could easily result in accidental overdose and quite possibly death.         Other   Polyarthralgia   History of opioid abuse (HCC)   RESOLVED: Opioid abuse, daily use (HCC)    No orders of the defined types were placed in this encounter.   Follow-up: Return in about 1 week (around 10/31/2021) for f/u pain.    Mort Sawyers, FNP

## 2021-10-25 ENCOUNTER — Encounter: Payer: Self-pay | Admitting: Family

## 2021-10-25 DIAGNOSIS — I471 Supraventricular tachycardia: Secondary | ICD-10-CM | POA: Insufficient documentation

## 2021-10-25 DIAGNOSIS — M2550 Pain in unspecified joint: Secondary | ICD-10-CM | POA: Insufficient documentation

## 2021-10-25 DIAGNOSIS — F1111 Opioid abuse, in remission: Secondary | ICD-10-CM | POA: Insufficient documentation

## 2021-10-25 DIAGNOSIS — F1494 Cocaine use, unspecified with cocaine-induced mood disorder: Secondary | ICD-10-CM | POA: Insufficient documentation

## 2021-10-25 DIAGNOSIS — F1110 Opioid abuse, uncomplicated: Secondary | ICD-10-CM | POA: Insufficient documentation

## 2021-10-25 NOTE — Assessment & Plan Note (Addendum)
cocaine use does seem to interfere with patient's ability to sleep, and also increases her anxiety resulting in pt having to take more benzodiazepines to control anxiety.   Counseled in length on daily use of cocaine. recommendations made for how to seek hep for this, whether with rehab or starting on suboxone or alternative medication for this. Pt is not sure which avenue she wants to take, and seems rather hesitant to quit at this time. Did advise pt that cocaine could easily result in accidental overdose and quite possibly death.

## 2021-10-25 NOTE — Assessment & Plan Note (Deleted)
Counseled in length on daily use of cocaine. recommendations made for how to seek hep for this, whether with rehab or starting on suboxone or alternative medication for this. Pt is not sure which avenue she wants to take, and seems rather hesitant to quit at this time. Did advise pt that cocaine could easily result in accidental overdose and quite possibly death.

## 2021-10-26 ENCOUNTER — Encounter: Payer: Self-pay | Admitting: Family

## 2021-10-29 ENCOUNTER — Encounter: Payer: Self-pay | Admitting: Family

## 2021-10-29 ENCOUNTER — Ambulatory Visit (INDEPENDENT_AMBULATORY_CARE_PROVIDER_SITE_OTHER): Payer: 59 | Admitting: Family

## 2021-10-29 VITALS — BP 122/80 | HR 55 | Temp 98.6°F | Resp 16 | Ht 61.0 in | Wt 121.4 lb

## 2021-10-29 DIAGNOSIS — R7989 Other specified abnormal findings of blood chemistry: Secondary | ICD-10-CM | POA: Insufficient documentation

## 2021-10-29 DIAGNOSIS — G4700 Insomnia, unspecified: Secondary | ICD-10-CM | POA: Diagnosis not present

## 2021-10-29 DIAGNOSIS — E049 Nontoxic goiter, unspecified: Secondary | ICD-10-CM | POA: Insufficient documentation

## 2021-10-29 DIAGNOSIS — R002 Palpitations: Secondary | ICD-10-CM | POA: Insufficient documentation

## 2021-10-29 DIAGNOSIS — G8929 Other chronic pain: Secondary | ICD-10-CM | POA: Diagnosis not present

## 2021-10-29 DIAGNOSIS — F419 Anxiety disorder, unspecified: Secondary | ICD-10-CM

## 2021-10-29 DIAGNOSIS — M2550 Pain in unspecified joint: Secondary | ICD-10-CM

## 2021-10-29 DIAGNOSIS — L0291 Cutaneous abscess, unspecified: Secondary | ICD-10-CM | POA: Insufficient documentation

## 2021-10-29 DIAGNOSIS — R0789 Other chest pain: Secondary | ICD-10-CM | POA: Insufficient documentation

## 2021-10-29 DIAGNOSIS — M5442 Lumbago with sciatica, left side: Secondary | ICD-10-CM

## 2021-10-29 DIAGNOSIS — R000 Tachycardia, unspecified: Secondary | ICD-10-CM

## 2021-10-29 DIAGNOSIS — M5441 Lumbago with sciatica, right side: Secondary | ICD-10-CM

## 2021-10-29 LAB — T3, FREE: T3, Free: 2.8 pg/mL (ref 2.3–4.2)

## 2021-10-29 LAB — TSH: TSH: 1.46 u[IU]/mL (ref 0.35–5.50)

## 2021-10-29 LAB — T4, FREE: Free T4: 0.72 ng/dL (ref 0.60–1.60)

## 2021-10-29 NOTE — Progress Notes (Signed)
Established Patient Office Visit  Subjective:  Patient ID: Kelsey Ho, female    DOB: 04/27/1990  Age: 31 y.o. MRN: [id number]  CC:  Chief Complaint  Patient presents with   Anxiety    HPI Kelsey Ho is here today with concerns.   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 This am woke up with some neck pain as well. Gabapentin has helped her a bit but it makes her too hungry so she doesn't want to take this anymore.   Anxiety: has tried lexapro in the past, takes alprazolam, she is prescribed 0.5 mg qid prn anxiety and dispensed 120. She states still often with daily anxiety. Hydroxyzine does not help her she states. Has tried noritriptyline in the past, didn't like how it made her feel. Has tried lexapro however can not recall how this went.   Chest pain, almost daily for a few months. Ekg sinus tachycardia in Er. Prior to that same day, 8/31 with sinus tachy with decreased PR. Negative d dimer in ER 8/31. Negative troponin 8/31 x 1 . Does often feel palpitations with anxiety as well. Has never been evaluated by cardiologist.last visit with tachycardia up to 144 on 8/31, today at 55 bpm.   Increasing migraines as of late, has had a migraine over the last one week.  Water- intake about two bottles a day.   Wt Readings from Last 3 Encounters:  10/29/21 121 lb 6 oz (55.1 kg)  10/24/21 117 lb 4 oz (53.2 kg)  10/22/21 118 lb (53.5 kg)   Has been losing her hair a little more, a lot coming out with the brush especially this am she states.  Lab Results  Component Value Date   TSH 0.595 07/23/2021   Sleep disorder: seroquel gives her vivid nightmares. She doesn't like how it feels. She is taking ambien 10 mg nightly. She sees pain management for this.   Past Medical History:  Diagnosis Date   Pinched nerve     Past Surgical History:  Procedure Laterality Date   COSMETIC  SURGERY      Family History  Problem Relation Age of Onset   Kidney Stones Mother    Cancer - Lung Maternal Grandmother    Bone cancer Maternal Grandfather     Social History   Socioeconomic History   Marital status: Single    Spouse name: Not on file   Number of children: Not on file   Years of education: Not on file   Highest education level: Not on file  Occupational History   Not on file  Tobacco Use   Smoking status: Former    Types: Cigarettes   Smokeless tobacco: Never  Vaping Use   Vaping Use: Never used  Substance and Sexual Activity   Alcohol use: Yes    Comment: occ   Drug use: Yes    Types: Marijuana, Cocaine, LSD, Benzodiazepines   Sexual activity: Not Currently    Birth control/protection: Pill  Other Topics Concern   Not on file  Social History Narrative   Not on file   Social Determinants of Health   Financial Resource Strain: Not on file  Food Insecurity: Not on file  Transportation Needs: Not on file  Physical Activity: Not on file  Stress: Not on file  Social Connections: Not on file  Intimate Partner Violence: Not on file    Outpatient Medications Prior  to Visit  Medication Sig Dispense Refill   cyclobenzaprine (FLEXERIL) 10 MG tablet Take 1 tablet (10 mg total) by mouth 3 (three) times daily as needed. 30 tablet 0   LO LOESTRIN FE 1 MG-10 MCG / 10 MCG tablet Take 1 tablet by mouth daily.     QUEtiapine (SEROQUEL) 50 MG tablet Take 50-200 mg by mouth at bedtime. Take 50 mg at night for a week then increase to 100 mg at night for a week then increase to 150 mg at night for a week then increase to 200 mg at night and continue that dose     zolpidem (AMBIEN CR) 12.5 MG CR tablet Take 10 mg by mouth at bedtime as needed.     ALPRAZolam (XANAX) 0.25 MG tablet Take 0.25 mg by mouth 2 (two) times daily as needed.     No facility-administered medications prior to visit.    Allergies  Allergen Reactions   Nortriptyline Hcl     Worsening mood         Objective:    Physical Exam Constitutional:      General: She is not in acute distress.    Appearance: Normal appearance. She is normal weight. She is not ill-appearing, toxic-appearing or diaphoretic.  Neck:     Thyroid: Thyromegaly present.     Trachea: Trachea normal.  Cardiovascular:     Rate and Rhythm: Normal rate and regular rhythm.  Pulmonary:     Effort: Pulmonary effort is normal.     Breath sounds: Normal breath sounds.  Abdominal:     General: Abdomen is flat.  Musculoskeletal:     Lumbar back: Tenderness (right mid to lower back with slight tenderness) present. No swelling or spasms. Decreased range of motion (painful rom with hyperextension and flexion). Positive right straight leg raise test.  Neurological:     General: No focal deficit present.     Mental Status: She is alert and oriented to person, place, and time. Mental status is at baseline.     Motor: No weakness.  Psychiatric:        Attention and Perception: Attention and perception normal.        Mood and Affect: Mood is anxious. Mood is not depressed. Affect is not tearful.        Speech: Speech normal.        Behavior: Behavior normal. Behavior is cooperative.        Thought Content: Thought content normal.        Cognition and Memory: Cognition and memory normal.        Judgment: Judgment normal.     BP 122/80   Pulse (!) 55   Temp 98.6 F (37 C)   Resp 16   Ht 5\' 1"  (1.549 m)   Wt 121 lb 6 oz (55.1 kg)   SpO2 99%   BMI 22.93 kg/m  Wt Readings from Last 3 Encounters:  10/29/21 121 lb 6 oz (55.1 kg)  10/24/21 117 lb 4 oz (53.2 kg)  10/22/21 118 lb (53.5 kg)     Health Maintenance Due  Topic Date Due   COVID-19 Vaccine (1) Never done   HIV Screening  Never done   Hepatitis C Screening  Never done   TETANUS/TDAP  Never done   PAP SMEAR-Modifier  Never done   INFLUENZA VACCINE  Never done    There are no preventive care reminders to display for this patient.  Lab  Results  Component Value Date  TSH 0.595 07/23/2021   Lab Results  Component Value Date   WBC 11.3 (H) 10/24/2021   HGB 15.5 (H) 10/24/2021   HCT 46.9 (H) 10/24/2021   MCV 85.3 10/24/2021   PLT 452 (H) 10/24/2021   Lab Results  Component Value Date   NA 143 10/24/2021   K 4.1 10/24/2021   CO2 28 10/24/2021   GLUCOSE 109 (H) 10/24/2021   BUN 11 10/24/2021   CREATININE 0.92 10/24/2021   BILITOT 1.1 07/23/2021   ALKPHOS 60 07/23/2021   AST 18 07/23/2021   ALT 15 07/23/2021   PROT 8.4 (H) 07/23/2021   ALBUMIN 5.0 07/23/2021   CALCIUM 10.2 10/24/2021   ANIONGAP 15 10/24/2021   No results found for: "HGBA1C"    Assessment & Plan:   Problem List Items Addressed This Visit       Endocrine   Goiter    US thyroid ordered pt to make appt  Order thyroid with panel to r/o other etiologies Possible hyperthyroid      Relevant Orders   Thyroid Peroxidase Antibodies (TPO) (REFL)   T3, free   T4, free   TSH   US THYROID     Nervous and Auditory   Chronic bilateral low back pain with bilateral sciatica    Unable to tolerate gabapentin which was helpful  Reviewed lumbar mri back in may 2023, with schmorls node and [place] tissue nodule right lower spine.  Eval/treat further, possible herniation? Pt declines physical therapy, has tried in past with no relief.  She has had injections in the spine which helped slightly.  Recently seen with [facility] clinic, however requesting second opinion today.  Referral placed for neurospine.       Relevant Orders   Ambulatory referral to Neurosurgery     Musculoskeletal and Integument   RESOLVED: Abscess of skin   Relevant Orders   Ambulatory referral to Neurosurgery     Other   Insomnia    Continue with pain management for ambien.        Relevant Orders   Ambulatory referral to Psychiatry   Ambulatory referral to Psychology   Chronic pain   Relevant Orders   Ambulatory referral to Neurosurgery   Anxiety - Primary     Referral placed for psychology and psychiatry Work on anxiety reducing techniques Cont f/u with pain management for xanax, also d/w pt risks of long term xanax and goal to start daily anti anxiety medication to help with goal of decreasing daily xanax.       Relevant Orders   Ambulatory referral to Psychiatry   Ambulatory referral to Psychology   Polyarthralgia    Continue f/u with rheumatology as scheduled      Other chest pain    Chronic, ongoing.  Cocaine use not helpful however might also be underlying etiology with tachycardia/bradycardia Referral placed for cardiologist consult       Relevant Orders   Ambulatory referral to Cardiology   Palpitations   Relevant Orders   Ambulatory referral to Cardiology   Decreased thyroid stimulating hormone (TSH) level    Thyroid panel and US thyroid ordered       Relevant Orders   Thyroid Peroxidase Antibodies (TPO) (REFL)   T3, free   T4, free   TSH   US THYROID   Tachycardia    No orders of the defined types were placed in this encounter.   Follow-up: Return in about 1 month (around 11/28/2021) for regular follow up appointment .  Eugenia Pancoast, FNP

## 2021-10-29 NOTE — Assessment & Plan Note (Signed)
Unable to tolerate gabapentin which was helpful  Reviewed lumbar mri back in may 2023, with schmorls node and [place] tissue nodule right lower spine.  Eval/treat further, possible herniation? Pt declines physical therapy, has tried in past with no relief.  She has had injections in the spine which helped slightly.  Recently seen with [facility] clinic, however requesting second opinion today.  Referral placed for neurospine.

## 2021-10-29 NOTE — Patient Instructions (Addendum)
  Increase water to about 64 ounces once daily.  Recommend vitamin b12 1000 mcg once daily.   Your imaging for thyroid ultrasound Has bene electronically ordered. Please call to schedule at the below office:   [place]: [facility]- enter through medical mall entrance [address], [place], [place] [address]  A referral was placed today for neurosurgery As well as cardiology.  This will be another place other than [facility].  Please let us know if you have not heard back within 2 weeks about the referral.  Also referring you to psychiatry, let's try with them for medications for anxiety and other options.  Also referral for psychology as I feel talking would be helpful, and getting you through anxiety in many ways as an alternative to only medication.   Due to recent changes in healthcare laws, you may see results of your imaging and/or laboratory studies on MyChart before I have had a chance to review them.  I understand that in some cases there may be results that are confusing or concerning to you. Please understand that not all results are received at the same time and often I may need to interpret multiple results in order to provide you with the best plan of care or course of treatment. Therefore, I ask that you please give me 2 business days to thoroughly review all your results before contacting my office for clarification. Should we see a critical lab result, you will be contacted sooner.   It was a pleasure seeing you today! Please do not hesitate to reach out with any questions and or concerns.  Regards,   Mort Sawyers FNP-C

## 2021-10-29 NOTE — Assessment & Plan Note (Signed)
Thyroid panel and US thyroid ordered

## 2021-10-29 NOTE — Assessment & Plan Note (Signed)
Chronic, ongoing.  Cocaine use not helpful however might also be underlying etiology with tachycardia/bradycardia Referral placed for cardiologist consult

## 2021-10-29 NOTE — Assessment & Plan Note (Signed)
US thyroid ordered pt to make appt  Order thyroid with panel to r/o other etiologies Possible hyperthyroid

## 2021-10-29 NOTE — Assessment & Plan Note (Signed)
Continue with pain management for ambien.

## 2021-10-29 NOTE — Assessment & Plan Note (Signed)
Referral placed for psychology and psychiatry Work on anxiety reducing techniques Cont f/u with pain management for xanax, also d/w pt risks of long term xanax and goal to start daily anti anxiety medication to help with goal of decreasing daily xanax.

## 2021-10-29 NOTE — Assessment & Plan Note (Signed)
Continue f/u with rheumatology as scheduled

## 2021-10-30 LAB — THYROID PEROXIDASE ANTIBODIES (TPO) (REFL): Thyroperoxidase Ab SerPl-aCnc: <1 IU/mL (ref <9)

## 2021-10-31 ENCOUNTER — Encounter: Payer: Self-pay | Admitting: *Deleted

## 2021-11-04 ENCOUNTER — Encounter: Payer: Self-pay | Admitting: *Deleted

## 2021-11-14 ENCOUNTER — Other Ambulatory Visit: Payer: Self-pay | Admitting: Family

## 2021-11-14 ENCOUNTER — Ambulatory Visit
Admission: RE | Admit: 2021-11-14 | Discharge: 2021-11-14 | Disposition: A | Discharge status: Home / Self Care | Payer: 59 | Source: Ambulatory Visit | Attending: Family | Admitting: Family

## 2021-11-14 DIAGNOSIS — R7989 Other specified abnormal findings of blood chemistry: Secondary | ICD-10-CM | POA: Insufficient documentation

## 2021-11-14 DIAGNOSIS — E049 Nontoxic goiter, unspecified: Secondary | ICD-10-CM | POA: Diagnosis not present

## 2021-11-14 DIAGNOSIS — E041 Nontoxic single thyroid nodule: Secondary | ICD-10-CM | POA: Diagnosis not present

## 2021-11-14 NOTE — Progress Notes (Unsigned)
  Mickey, psychiatry from [place], she is a boarded psychiatric PA however recently moved to [place] office. She is calling to update me on patient status.   Came into their care back in April 2023. 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   Psychiatrist did try to prescribe her lexapro however she never followed up as she was able.  Then she tried to prescribe patient gabapentin, gave her one month supply, and she took the one month supply in one week. Shortly after this,she overdosed and this was when she was revived at the hospital back in May.   Video call today with Patient, and she had a stack full of pills and she was inquiring where to dump all of these pills. Psychiatrist told her where to appropriately dispose. She has been off of xanax x one week, and was given lorazepam over at [facility] rather than xanax due to them being suspicious of her abusing the xanax.   [facility] is very hesitant to fill for her again in the future due to long h/o drug abuse.

## 2021-11-15 ENCOUNTER — Ambulatory Visit: Payer: 59 | Admitting: Family

## 2021-11-15 ENCOUNTER — Encounter: Payer: Self-pay | Admitting: Family

## 2021-11-15 ENCOUNTER — Telehealth (INDEPENDENT_AMBULATORY_CARE_PROVIDER_SITE_OTHER): Payer: 59 | Admitting: Family

## 2021-11-15 ENCOUNTER — Other Ambulatory Visit: Payer: Self-pay | Admitting: Family

## 2021-11-15 DIAGNOSIS — M5441 Lumbago with sciatica, right side: Secondary | ICD-10-CM

## 2021-11-15 DIAGNOSIS — M5442 Lumbago with sciatica, left side: Secondary | ICD-10-CM

## 2021-11-15 DIAGNOSIS — G8929 Other chronic pain: Secondary | ICD-10-CM

## 2021-11-15 DIAGNOSIS — G4700 Insomnia, unspecified: Secondary | ICD-10-CM

## 2021-11-15 DIAGNOSIS — F419 Anxiety disorder, unspecified: Secondary | ICD-10-CM | POA: Diagnosis not present

## 2021-11-15 DIAGNOSIS — Z79899 Other long term (current) drug therapy: Secondary | ICD-10-CM | POA: Diagnosis not present

## 2021-11-15 DIAGNOSIS — R69 Illness, unspecified: Secondary | ICD-10-CM | POA: Diagnosis not present

## 2021-11-15 NOTE — Progress Notes (Unsigned)
Established Patient Office Visit  Subjective:  Patient ID: Kelsey Ho, female    DOB: 08/15/1990  Age: 31 y.o. MRN: [id number]  CC:  Chief Complaint  Patient presents with   Pain Management    Wanted to see if you can give scripts for pian management.     HPI Kelsey Ho is here today with concerns.   U/S thyroid: recently completed 11/14/21   [facility], 200$ each visit one hour trip to there and back. She recently was changed from xanax to lorazepam,   Also seeing a psychiatrist now, Dr. Chucky May  Past Medical History:  Diagnosis Date   Pinched nerve     Past Surgical History:  Procedure Laterality Date   COSMETIC SURGERY      Family History  Problem Relation Age of Onset   Kidney Stones Mother    Cancer - Lung Maternal Grandmother    Bone cancer Maternal Grandfather     Social History   Socioeconomic History   Marital status: Single    Spouse name: Not on file   Number of children: Not on file   Years of education: Not on file   Highest education level: Not on file  Occupational History   Not on file  Tobacco Use   Smoking status: Former    Types: Cigarettes   Smokeless tobacco: Never  Vaping Use   Vaping Use: Never used  Substance and Sexual Activity   Alcohol use: Yes    Comment: occ   Drug use: Yes    Types: Marijuana, Cocaine, LSD, Benzodiazepines   Sexual activity: Not Currently    Birth control/protection: Pill  Other Topics Concern   Not on file  Social History Narrative   Not on file   Social Determinants of Health   Financial Resource Strain: Not on file  Food Insecurity: Not on file  Transportation Needs: Not on file  Physical Activity: Not on file  Stress: Not on file  Social Connections: Not on file  Intimate Partner Violence: Not on file    Outpatient Medications Prior to Visit  Medication Sig Dispense Refill   ALPRAZolam (XANAX) 0.5 MG tablet Take 0.5 mg by mouth 4 (four) times daily as  needed.     cyanocobalamin (VITAMIN B12) 1000 MCG/ML injection Inject into the muscle.     cyclobenzaprine (FLEXERIL) 10 MG tablet Take 1 tablet (10 mg total) by mouth 3 (three) times daily as needed. 30 tablet 0   LO LOESTRIN FE 1 MG-10 MCG / 10 MCG tablet Take 1 tablet by mouth daily.     pregabalin (LYRICA) 50 MG capsule Take 50 mg by mouth 2 (two) times daily. Taking 2 tab bid     zolpidem (AMBIEN CR) 12.5 MG CR tablet Take 10 mg by mouth at bedtime as needed.     QUEtiapine (SEROQUEL) 50 MG tablet Take 50-200 mg by mouth at bedtime. Take 50 mg at night for a week then increase to 100 mg at night for a week then increase to 150 mg at night for a week then increase to 200 mg at night and continue that dose (Patient not taking: Reported on 11/15/2021)     No facility-administered medications prior to visit.    Allergies  Allergen Reactions   Nortriptyline Hcl     Worsening mood        Objective:    Physical Exam  LMP  (Approximate)  Wt Readings from Last 3 Encounters:  10/29/21  121 lb 6 oz (55.1 kg)  10/24/21 117 lb 4 oz (53.2 kg)  10/22/21 118 lb (53.5 kg)     Health Maintenance Due  Topic Date Due   COVID-19 Vaccine (1) Never done   HIV Screening  Never done   Hepatitis C Screening  Never done   TETANUS/TDAP  Never done   PAP SMEAR-Modifier  Never done    There are no preventive care reminders to display for this patient.  Lab Results  Component Value Date   TSH 1.46 10/29/2021   Lab Results  Component Value Date   WBC 11.3 (H) 10/24/2021   HGB 15.5 (H) 10/24/2021   HCT 46.9 (H) 10/24/2021   MCV 85.3 10/24/2021   PLT 452 (H) 10/24/2021   Lab Results  Component Value Date   NA 143 10/24/2021   K 4.1 10/24/2021   CO2 28 10/24/2021   GLUCOSE 109 (H) 10/24/2021   BUN 11 10/24/2021   CREATININE 0.92 10/24/2021   BILITOT 1.1 07/23/2021   ALKPHOS 60 07/23/2021   AST 18 07/23/2021   ALT 15 07/23/2021   PROT 8.4 (H) 07/23/2021   ALBUMIN 5.0 07/23/2021    CALCIUM 10.2 10/24/2021   ANIONGAP 15 10/24/2021   No results found for: "HGBA1C"    Assessment & Plan:   Problem List Items Addressed This Visit   None   No orders of the defined types were placed in this encounter.   Follow-up: No follow-ups on file.    Mort Sawyers, FNP

## 2021-11-16 ENCOUNTER — Encounter: Payer: Self-pay | Admitting: Family

## 2021-11-18 ENCOUNTER — Other Ambulatory Visit: Payer: Self-pay | Admitting: Family

## 2021-11-18 DIAGNOSIS — Z79899 Other long term (current) drug therapy: Secondary | ICD-10-CM | POA: Insufficient documentation

## 2021-11-18 NOTE — Assessment & Plan Note (Signed)
Pt with drug contract with [facility], advised I do not prescribe for chronic pain medication. Long d/w pt on taking muscle relaxer's with other sedative medications she has been prescribed and the risk associated.

## 2021-11-18 NOTE — Assessment & Plan Note (Signed)
Today in office hr 140's.  Advised pt this can be life threatening/fatal and pt advised to to go ER. She declines EMS and even states she might not go to ER. She is accompanied by mom who is also trying to talk pt into going to ER. Advised pt this could result in life threatening situation and or death. Pt aware. Stable upon leaving.

## 2021-11-18 NOTE — Patient Instructions (Signed)
Welcome to our clinic, I am happy to have you as my new patient. I am excited to continue on this healthcare journey with you.  Please keep in mind Any my chart messages you send have up to a three business day turnaround for a response.  Phone calls may take up to a one full business day turnaround for a  response.   If you need a medication refill I recommend you request it through the pharmacy as this is easiest for [place] rather than sending a message and or phone call.   Due to recent changes in healthcare laws, you may see results of your imaging and/or laboratory studies on MyChart before I have had a chance to review them.  I understand that in some cases there may be results that are confusing or concerning to you. Please understand that not all results are received at the same time and often I may need to interpret multiple results in order to provide you with the best plan of care or course of treatment. Therefore, I ask that you please give me 2 business days to thoroughly review all your results before contacting my office for clarification. Should we see a critical lab result, you will be contacted sooner.   It was a pleasure seeing you today! Please do not hesitate to reach out with any questions and or concerns.  Regards,   Eugenia Pancoast FNP-C

## 2021-11-18 NOTE — Assessment & Plan Note (Signed)
Still uncontrolled Benzo dependent, does appear to be taking more than indicated pdmp reviewed. High risk profile.  Did d/w pharmacist as well, and suggestion made to pt trial SSRI and or remeron. Pt declines both, stating 'I dont want to take antidepressant". Pt to maintain f/u with psychologist coming up to establish care. Also strongly advised pt to stick with psychiatrist Dr. Toy Care and to f/u as scheduled and let her know anxiety is not controlled. D/w pt I will not be able to prescribe any controlled substances to her, she will need to get her medications filled by pain management and psychiatry only.

## 2021-11-18 NOTE — Assessment & Plan Note (Addendum)
Long d/w pt on interactions between current high risk medications, and likelihood of pt having sx of serotonin syndrome. D/w pt ways to decrease this risk, and advised her on medication I would like her to take instead however very resistant to starting any new therapy such as SSRI, and does not want to stop taking high dose benzodiazepines as states 'only thing that helps my anxiety truly and keeps me off of the streets buying drugs'.  Did also consult with pharmacist who recommend ssri as well, however pt refuses.   Extensive time was spent via video visit with pt going over her concerns, and having to repeat recommendations totally approximately 55 minutes.

## 2021-11-18 NOTE — Assessment & Plan Note (Signed)
Advised pt to try to reduce the amount of repetitive behavior and work on stretches for sciatica.  Tylenol prn.  Heat to site when able.

## 2021-11-18 NOTE — Assessment & Plan Note (Signed)
Would like to have pt remain on lyrica ideally, d/c ambien and work on sleep hygiene, however pt is unwilling to alter her RX for [employer]. I am worried about the high risk dosage interaction and advised pt of such. Encouraged pt to f/u with psychiatry to see if this could be altered for better control.

## 2021-11-19 DIAGNOSIS — G8929 Other chronic pain: Secondary | ICD-10-CM | POA: Diagnosis not present

## 2021-11-20 ENCOUNTER — Other Ambulatory Visit (HOSPITAL_COMMUNITY): Payer: Self-pay

## 2021-11-20 MED ORDER — LO LOESTRIN FE 1 MG-10 MCG / 10 MCG PO TABS
1.0000 | ORAL_TABLET | Freq: Every day | ORAL | 5 refills | Status: DC
  Filled 2021-11-20 – 2022-01-12 (×2): qty 28, 28d supply, fill #0
  Filled 2022-03-20 – 2022-06-20 (×5): qty 28, 28d supply, fill #1
  Filled 2022-09-16: qty 28, 28d supply, fill #2
  Filled 2022-10-21: qty 28, 28d supply, fill #3
  Filled 2022-11-10 – 2022-11-13 (×3): qty 28, 28d supply, fill #0

## 2021-11-21 ENCOUNTER — Telehealth: Payer: Self-pay | Admitting: Family

## 2021-11-21 ENCOUNTER — Ambulatory Visit: Payer: 59

## 2021-11-21 DIAGNOSIS — R69 Illness, unspecified: Secondary | ICD-10-CM | POA: Diagnosis not present

## 2021-11-21 DIAGNOSIS — Z79899 Other long term (current) drug therapy: Secondary | ICD-10-CM | POA: Diagnosis not present

## 2021-11-21 DIAGNOSIS — M797 Fibromyalgia: Secondary | ICD-10-CM | POA: Diagnosis not present

## 2021-11-21 NOTE — Telephone Encounter (Signed)
Bradley from [employer] called in and needed to speak with [place] regarding Duana. He can be reached at [phone]. Thank you!

## 2021-11-21 NOTE — Telephone Encounter (Signed)
Attempted to call office back left message for Kelsey Ho however no one answered.  requested call back

## 2021-11-22 ENCOUNTER — Telehealth: Payer: Self-pay | Admitting: Family

## 2021-11-22 ENCOUNTER — Encounter: Payer: Self-pay | Admitting: Family

## 2021-11-22 NOTE — Telephone Encounter (Signed)
Patient called to get medication filled for pregabalin (LYRICA) 50 MG capsule. Call back number [phone].

## 2021-11-22 NOTE — Telephone Encounter (Signed)
Responded via MyChart message.

## 2021-11-22 NOTE — Telephone Encounter (Signed)
Mickey from [facility] following up with our office, this is her psychiatrist and also psychologist. She is going to have Kaidence come in to see psychologist Leory Plowman to speak with her and discuss trying to get her into rehab as previously tested positive for cocaine.   They have appt with her on Tuesday 10/5.

## 2021-11-25 ENCOUNTER — Telehealth: Payer: Self-pay | Admitting: *Deleted

## 2021-11-25 NOTE — Telephone Encounter (Signed)
Called and pt said she got it fixed. She wanted me to tell you to give her a call at your earliest [employer]

## 2021-11-25 NOTE — Telephone Encounter (Signed)
It pt speaking about lyrica?  Please call and again inform her that I do not fill lyrica and or pain medication or anxiety medication. This is a controlled substance. She needs to follow up with psychiatry or pain management.

## 2021-11-25 NOTE — Telephone Encounter (Signed)
PLEASE NOTE: All timestamps contained within this report are represented as [place] Standard Time. CONFIDENTIALTY NOTICE: This fax transmission is intended only for the addressee. It contains information that is legally privileged, confidential or otherwise protected from use or disclosure. If you are not the intended recipient, you are strictly prohibited from reviewing, disclosing, copying using or disseminating any of this information or taking any action in reliance on or regarding this information. If you have received this fax in error, please notify us immediately by telephone so that we can arrange for its return to [place]. Phone: [phone], Toll-Free: [phone], Fax: [phone] Page: 1 of 1 Call Id: [id number] Vander RECORD [facility] Patient Name: Kelsey Ho [facility] Gender: Female DOB: 11/09/1990 Age: 31 Y 1 M 3 D Return Phone Number: [phone] (Primary), [phone] (Secondary) Address: City/ State/ Zip: [place]  [address] Client [place] Night - Client Client Site [place] - Night Provider Alma Friendly - NP Contact Type Call Who Is Calling Patient / Member / Family / Caregiver Call Type Triage / Clinical Relationship To Patient Self Return Phone Number [phone] (Primary) Chief Complaint Pain - Generalized Reason for Call Symptomatic / Request for [place] states was told to double medication to 100mg . Cannot pick it up as it has not been approved by the Dr. Pharmacy told to double the 50mg  previously taken. Does not have any medication at all. Fibromyalgia Translation No Disp. Time [name] Time) Disposition Final User 11/23/2021 11:24:53 AM Attempt made - message left [name], RN, [name] 11/23/2021 11:35:50 AM FINAL ATTEMPT MADE - no message left Yes [name] RN, [name] Final Disposition 11/23/2021 11:35:50 AM  FINAL ATTEMPT MADE - no message left Yes [name], RN, [name] Comments User: Suezanne Jacquet, RN Date/Time [name] Time): 11/23/2021 11:36:13 AM Unable to leave message on primary return phone number.

## 2021-11-26 ENCOUNTER — Telehealth: Payer: Self-pay | Admitting: Family

## 2021-11-26 NOTE — Telephone Encounter (Signed)
Caller Name: haelyn Enderle Call back phone #: [phone]  MEDICATION(S):  ALPRAZolam (XANAX) 0.5 MG tablet  Days of Med Remaining: 0  Has the patient contacted their pharmacy (YES/NO)? NO What did pharmacy advise?   Preferred Pharmacy:  [facility], [address]   Next ov : 11/28/21  ~~~Please advise patient/caregiver to allow 2-3 business days to process RX refills.

## 2021-11-26 NOTE — Telephone Encounter (Signed)
Will d/w pt at her follow up visit.

## 2021-11-27 ENCOUNTER — Ambulatory Visit: Payer: 59 | Admitting: Family

## 2021-11-27 NOTE — Telephone Encounter (Signed)
Have advised pt multiple times I will not be prescribing any controlled substances.

## 2021-11-28 ENCOUNTER — Ambulatory Visit: Payer: 59 | Admitting: Family

## 2021-11-28 ENCOUNTER — Ambulatory Visit: Payer: 59 | Attending: Cardiology | Admitting: Cardiology

## 2021-12-25 ENCOUNTER — Ambulatory Visit: Payer: 59 | Admitting: Family

## 2021-12-26 ENCOUNTER — Ambulatory Visit (INDEPENDENT_AMBULATORY_CARE_PROVIDER_SITE_OTHER): Payer: 59 | Admitting: Family

## 2021-12-26 ENCOUNTER — Ambulatory Visit: Payer: 59 | Admitting: Family

## 2021-12-26 ENCOUNTER — Other Ambulatory Visit: Payer: Self-pay

## 2021-12-26 ENCOUNTER — Telehealth: Payer: Self-pay | Admitting: *Deleted

## 2021-12-26 VITALS — BP 124/62 | HR 72 | Temp 98.6°F | Resp 16 | Ht 61.0 in | Wt 131.1 lb

## 2021-12-26 DIAGNOSIS — G4700 Insomnia, unspecified: Secondary | ICD-10-CM | POA: Diagnosis not present

## 2021-12-26 DIAGNOSIS — F1111 Opioid abuse, in remission: Secondary | ICD-10-CM

## 2021-12-26 DIAGNOSIS — M5441 Lumbago with sciatica, right side: Secondary | ICD-10-CM

## 2021-12-26 DIAGNOSIS — F1494 Cocaine use, unspecified with cocaine-induced mood disorder: Secondary | ICD-10-CM | POA: Diagnosis not present

## 2021-12-26 DIAGNOSIS — M797 Fibromyalgia: Secondary | ICD-10-CM | POA: Diagnosis not present

## 2021-12-26 DIAGNOSIS — E538 Deficiency of other specified B group vitamins: Secondary | ICD-10-CM | POA: Insufficient documentation

## 2021-12-26 DIAGNOSIS — G8929 Other chronic pain: Secondary | ICD-10-CM | POA: Diagnosis not present

## 2021-12-26 DIAGNOSIS — F419 Anxiety disorder, unspecified: Secondary | ICD-10-CM | POA: Diagnosis not present

## 2021-12-26 DIAGNOSIS — M5442 Lumbago with sciatica, left side: Secondary | ICD-10-CM | POA: Diagnosis not present

## 2021-12-26 DIAGNOSIS — Z79899 Other long term (current) drug therapy: Secondary | ICD-10-CM

## 2021-12-26 MED ORDER — CYANOCOBALAMIN 1000 MCG/ML IJ SOLN
1000.0000 ug | Freq: Once | INTRAMUSCULAR | Status: DC
  Administered 2021-12-26: 1000 ug via INTRAMUSCULAR

## 2021-12-26 NOTE — Assessment & Plan Note (Signed)
Advised pt I am concerned with high risk medication use.  Ideally advised pt I would like to see her on a daily SSRI/SNRI for uncontrolled anxiety and or a mood stabilizer, however we need evaluation by psychiatry for appropriate medication to be prescribed. Have tried multiple ssri and cymbalta with undesired s/e profile per patient.  Referral placed to ccm for social work involvement, goal is to find psychiatrist as well as psychology as I feel both are needed to help patient stat on path to appropriate medication management and healing.  Will also refer to pharmacist for polypharmacy and high risk medication use.

## 2021-12-26 NOTE — Progress Notes (Signed)
Established Patient Office Visit  Subjective:  Patient ID: Kelsey Ho, female    DOB: 1990-09-03  Age: 31 y.o. MRN: [id number]  CC:  Chief Complaint  Patient presents with   Pain    HPI Kelsey Ho is here today for follow up.   Pt is with acute concerns.  [place]: pain management: started cymbalta but is worried that she is now seeing hallucinations or something similar. Recently   Taking lorazepam 1 mg prescribed by pain management Dr. Shela Commons.  Also taking ambien 10 mg that she states she doesn't take nightly' but typically takes every other night. Does take nightly muscle relaxer, whether tizanidine vs cyclobenzaprine. She states she is also still taking lyrica 100 mg twice daily for fibromyalgia.   She also does admit that occasionally she will also add on seroquel, which is from an older prescription that she no longer is prescribed if she wants to sleep better. She states she hasn't slept at all over the last three nights as her mind keeps racing.   Cocaine use: she states that in the last few days, she has thrown out her cocaine, and has not used for the last few days. She has a desire to cut down on her medications, however she does not know how to do it currently. She was seeing psychiatry with [facility] however she states they told her she would only be offered suboxone moving forward, vs being given an RX for anything that is a controlled substance or a pain medication.   She was recently prescribed Duloxetine 30 mg - didn't like this bc it made her feel 'jolts' even with lower dosage wasn't able to tolerate. She has since d/c this as well.    She is not open to inpatient rehab, she would try outpatient however. She does not currently see a therapist. Was seeing psychiatry Dr. Toy Care, has a f/u in November for this.   She is also preoccupied with her weight, as she has gained ten pounds in the last two months. She states that she is not eating much because she wants to lose  weight.   Wt Readings from Last 3 Encounters:  12/26/21 131 lb 2 oz (59.5 kg)  10/29/21 121 lb 6 oz (55.1 kg)  10/24/21 117 lb 4 oz (53.2 kg)     Past Medical History:  Diagnosis Date   Pinched nerve     Past Surgical History:  Procedure Laterality Date   COSMETIC SURGERY      Family History  Problem Relation Age of Onset   Kidney Stones Mother    Cancer - Lung Maternal Grandmother    Bone cancer Maternal Grandfather     Social History   Socioeconomic History   Marital status: Single    Spouse name: Not on file   Number of children: Not on file   Years of education: Not on file   Highest education level: Not on file  Occupational History   Not on file  Tobacco Use   Smoking status: Former    Types: Cigarettes   Smokeless tobacco: Never  Vaping Use   Vaping Use: Never used  Substance and Sexual Activity   Alcohol use: Yes    Comment: occ   Drug use: Yes    Types: Marijuana, Cocaine, LSD, Benzodiazepines   Sexual activity: Not Currently    Birth control/protection: Pill  Other Topics Concern   Not on file  Social History Narrative   Not on file  Social Determinants of Health   Financial Resource Strain: Not on file  Food Insecurity: Not on file  Transportation Needs: Not on file  Physical Activity: Not on file  Stress: Not on file  Social Connections: Not on file  Intimate Partner Violence: Not on file    Outpatient Medications Prior to Visit  Medication Sig Dispense Refill   cyanocobalamin (VITAMIN B12) 1000 MCG/ML injection Inject into the muscle.     cyclobenzaprine (FLEXERIL) 10 MG tablet Take 10 mg by mouth 3 (three) times daily.     LO LOESTRIN FE 1 MG-10 MCG / 10 MCG tablet Take 1 tablet by mouth daily. 28 tablet 5   LORazepam (ATIVAN) 1 MG tablet Take 1 mg by mouth 3 (three) times daily.     pregabalin (LYRICA) 50 MG capsule Take 50 mg by mouth 2 (two) times daily. Taking 2 tab bid     zolpidem (AMBIEN CR) 12.5 MG CR tablet Take 10 mg by  mouth at bedtime as needed.     ALPRAZolam (XANAX) 0.5 MG tablet Take 0.5 mg by mouth 4 (four) times daily as needed. (Patient not taking: Reported on 12/26/2021)     No facility-administered medications prior to visit.    Allergies  Allergen Reactions   Nortriptyline Hcl     Worsening mood         Objective:    Physical Exam Constitutional:      General: She is not in acute distress.    Appearance: Normal appearance. She is normal weight. She is not ill-appearing, toxic-appearing or diaphoretic.  Cardiovascular:     Rate and Rhythm: Normal rate and regular rhythm.  Pulmonary:     Effort: Pulmonary effort is normal.  Neurological:     General: No focal deficit present.     Mental Status: She is alert and oriented to person, place, and time. Mental status is at baseline.  Psychiatric:        Mood and Affect: Mood and affect normal.        Speech: Speech normal.        Behavior: Behavior is cooperative.        Thought Content: Thought content normal. Thought content does not include homicidal or suicidal plan.        Cognition and Memory: Cognition and memory normal.        Judgment: Judgment is impulsive.     BP 124/62   Pulse 72   Temp 98.6 F (37 C)   Resp 16   Ht 5\' 1"  (1.549 m)   Wt 131 lb 2 oz (59.5 kg)   SpO2 100%   BMI 24.78 kg/m  Wt Readings from Last 3 Encounters:  12/26/21 131 lb 2 oz (59.5 kg)  10/29/21 121 lb 6 oz (55.1 kg)  10/24/21 117 lb 4 oz (53.2 kg)     Health Maintenance Due  Topic Date Due   COVID-19 Vaccine (1) Never done   HIV Screening  Never done   Hepatitis C Screening  Never done   TETANUS/TDAP  Never done   PAP SMEAR-Modifier  Never done    There are no preventive care reminders to display for this patient.  Lab Results  Component Value Date   TSH 1.46 10/29/2021   Lab Results  Component Value Date   WBC 11.3 (H) 10/24/2021   HGB 15.5 (H) 10/24/2021   HCT 46.9 (H) 10/24/2021   MCV 85.3 10/24/2021   PLT 452 (H)  10/24/2021   Lab  Results  Component Value Date   NA 143 10/24/2021   K 4.1 10/24/2021   CO2 28 10/24/2021   GLUCOSE 109 (H) 10/24/2021   BUN 11 10/24/2021   CREATININE 0.92 10/24/2021   BILITOT 1.1 07/23/2021   ALKPHOS 60 07/23/2021   AST 18 07/23/2021   ALT 15 07/23/2021   PROT 8.4 (H) 07/23/2021   ALBUMIN 5.0 07/23/2021   CALCIUM 10.2 10/24/2021   ANIONGAP 15 10/24/2021   No results found for: "CHOL" No results found for: "HDL" No results found for: "LDLCALC" No results found for: "TRIG" No results found for: "CHOLHDL" No results found for: "HGBA1C"    Assessment & Plan:   Problem List Items Addressed This Visit       Nervous and Auditory   Cocaine use with cocaine-induced mood disorder ([place])    Pt states working on quitting, and states has disposed of her current supply  Encouraged to continue to stay off of.  Did also encourage suboxone to suppress cravings for substances       Relevant Orders   AMB Referral to [facility] Coordinaton   Chronic bilateral low back pain with bilateral sciatica    Advised pt to no longer take gabapentin.  Advised pt I am worried with her high risk use of medication, and advised to only continue with lyrica prescribed by pain management. Advised pt to only use muscle relaxer prn and advised not to take with other sedative type medications.  Heat to site, massage as necessary. Overuse causative of worsening symptoms, so advised to try not to overuse with line of work.        Relevant Medications   cyclobenzaprine (FLEXERIL) 10 MG tablet   LORazepam (ATIVAN) 1 MG tablet     Other   Insomnia    Suspected due to uncontrolled anxiety.  Pt to work on proper sleep hygiene as well, however, likely will need to be evaluated by psychiatry. Recommended pt return to psychiatry Dr. Toy Care as possible mania at times. Pt very impulsive and she agrees, would like proper evaluation by psychiatry to be on appropriate medication management.        Anxiety - Primary   Relevant Medications   LORazepam (ATIVAN) 1 MG tablet   Other Relevant Orders   AMB Referral to [facility]   History of opioid abuse ([place])    Encourage dpt to f/u with psychiatry who has recommended suboxone to reduce cravings, and help pt overcome need for multiple substances.       Relevant Orders   AMB Referral to [facility] Coordinaton   High risk medication use    Advised pt I am concerned with high risk medication use.  Ideally advised pt I would like to see her on a daily SSRI/SNRI for uncontrolled anxiety and or a mood stabilizer, however we need evaluation by psychiatry for appropriate medication to be prescribed. Have tried multiple ssri and cymbalta with undesired s/e profile per patient.  Referral placed to ccm for social work involvement, goal is to find psychiatrist as well as psychology as I feel both are needed to help patient stat on path to appropriate medication management and healing.  Will also refer to pharmacist for polypharmacy and high risk medication use.       Relevant Orders   [facility]B [facility]   Fibromyalgia    Cymbalta with s/e , pt d/c.  Advised pt to f/u with rheumatology as scheduled in January 2023.  Relevant Medications   cyclobenzaprine (FLEXERIL) 10 MG tablet   Low serum vitamin B12    Vitamin b12 1000 mcg injection given in office today.  Will repeat once monthly x two more months however encouraged pt to start daily otc b12 1000 mcg       No orders of the defined types were placed in this encounter.   Follow-up: Return in about 2 weeks (around 01/09/2022) for f/u .    Eugenia Pancoast, FNP

## 2021-12-26 NOTE — Assessment & Plan Note (Signed)
Vitamin b12 1000 mcg injection given in office today.  Will repeat once monthly x two more months however encouraged pt to start daily otc b12 1000 mcg

## 2021-12-26 NOTE — Assessment & Plan Note (Addendum)
Pt states working on quitting, and states has disposed of her current supply  Encouraged to continue to stay off of.  Did also encourage suboxone to suppress cravings for substances

## 2021-12-26 NOTE — Chronic Care Management (AMB) (Signed)
  Care Coordination  Outreach Note  12/26/2021 Name: Kelsey Ho MRN: [id number] DOB: February 09, 1991   Care Coordination Outreach Attempts: An unsuccessful telephone outreach was attempted today to offer the patient information about available care coordination services as a benefit of their health plan.   Referral received   Follow Up Plan:  Additional outreach attempts will be made to offer the patient care coordination information and services.   Encounter Outcome:  No Answer  Julian Hy, [place] Direct Dial: [phone]

## 2021-12-26 NOTE — Assessment & Plan Note (Signed)
Advised pt to no longer take gabapentin.  Advised pt I am worried with her high risk use of medication, and advised to only continue with lyrica prescribed by pain management. Advised pt to only use muscle relaxer prn and advised not to take with other sedative type medications.  Heat to site, massage as necessary. Overuse causative of worsening symptoms, so advised to try not to overuse with line of work.

## 2021-12-26 NOTE — Assessment & Plan Note (Signed)
Encourage dpt to f/u with psychiatry who has recommended suboxone to reduce cravings, and help pt overcome need for multiple substances.

## 2021-12-26 NOTE — Assessment & Plan Note (Signed)
Cymbalta with s/e , pt d/c.  Advised pt to f/u with rheumatology as scheduled in January 2023.

## 2021-12-26 NOTE — Assessment & Plan Note (Signed)
Suspected due to uncontrolled anxiety.  Pt to work on proper sleep hygiene as well, however, likely will need to be evaluated by psychiatry. Recommended pt return to psychiatry Dr. Toy Care as possible mania at times. Pt very impulsive and she agrees, would like proper evaluation by psychiatry to be on appropriate medication management.

## 2021-12-27 ENCOUNTER — Telehealth: Payer: Self-pay | Admitting: Family

## 2021-12-27 NOTE — Telephone Encounter (Signed)
Mom is calling to talk about recent encounters.   Last night she went to her neighbors and around 9 am this am she started hallucinating again stating that people were in the home, and stated they were shining a light in the window again. She called her neighbor and told them people were in the neighbors car and they were dressed up, neighbor called Maryuri's mom and her dad got her in the car and she told him no. [place] then called the police, and the police came and   She has had lights turned off for the last two weeks because she is not paying her bills. Has phone turned off because she has not paid her bills either.   The house is in disarray.     ------------------------------------  For walk in options for mental health:   [facility] [address] in [place], [place]. Call our 24-hour HelpLine at (337)678-2932 or 430-261-0173 for immediate assistance for mental health and substance abuse issues.  And or walk into [facility] hospital ER.   [employer] Network: 1-800-SUICIDE  The [employer] Suicide Prevention Lifeline: [employer]

## 2021-12-27 NOTE — Telephone Encounter (Signed)
Patient mom Kelsey Ho called in and wanted to speak with Kelsey Ho about Kelsey Ho hallucinating.

## 2021-12-31 NOTE — Progress Notes (Signed)
  Care Coordination  Outreach Note  12/31/2021 Name: Kelsey Ho MRN: [id number] DOB: 12-Mar-1990   Care Coordination Outreach Attempts: An unsuccessful telephone outreach was attempted today to offer the patient information about available care coordination services as a benefit of their health plan.   Referral received   Follow Up Plan:  Additional outreach attempts will be made to offer the patient care coordination information and services.   Encounter Outcome:  No Answer  Julian Hy, [place] Direct Dial: [phone]

## 2021-12-31 NOTE — Addendum Note (Signed)
Addended by: Francella Solian on: 12/31/2021 03:38 PM   Modules accepted: Orders, Level of Service

## 2021-12-31 NOTE — Progress Notes (Signed)
This encounter was created in error - please disregard.

## 2022-01-06 NOTE — Telephone Encounter (Signed)
Called and spoke with mom for an update. She states she has called the police multiple times as Roxana is have hallucinations, and see graffiti on walls and people that are not there. She states she is seeing people in her neighbors car that are not there. She is saying people are having intercourse in her bed that are not , so she hasn't been sleeping and when she does sleep she sleeps on the floor.   Mom states crisis mobile was called and came to the home twice but did not involuntarily admit her. Mom states pt still hallucinating, and she isn't sure what to do at this point. Did advise mom can bring her to ER and or call 911 if she feels pt is to be a harm to herself. Mom states she will consider this.

## 2022-01-06 NOTE — Progress Notes (Signed)
  Care Coordination   Note   01/06/2022 Name: Kelsey Ho MRN: [id number] DOB: [birth year]  Kelsey Ho is a 31 y.o. year old female who sees Mort Sawyers, FNP for primary care. I reached out to Karl Bales by phone today to offer care coordination services.  Referral received   Kelsey Ho was given information about Care Coordination services today including:   The Care Coordination services include support from the care team which includes your Nurse Coordinator, Clinical Social Worker, or Pharmacist.  The Care Coordination team is here to help remove barriers to the health concerns and goals most important to you. Care Coordination services are voluntary, and the patient may decline or stop services at any time by request to their care team member.   Care Coordination Consent Status: Patient agreed to services and verbal consent obtained.   Follow up plan:  Telephone appointment with care coordination team member scheduled for:  01/10/2022  Encounter Outcome:  Pt. Scheduled from referral   Burman Nieves, [facility] Care Coordination Care Guide Direct Dial: [phone]

## 2022-01-07 ENCOUNTER — Telehealth: Payer: Self-pay | Admitting: Family

## 2022-01-07 NOTE — Telephone Encounter (Signed)
Pt's mother, Leia Alf called asking could she receive a call once pt's ov is completed on 01/09/22 with [place]? Call back # [phone]

## 2022-01-08 ENCOUNTER — Telehealth: Payer: Self-pay | Admitting: Family

## 2022-01-08 NOTE — Telephone Encounter (Signed)
Patient mother called and stated can Kelsey Ho call her before the patient appointment tomorrow. Call back number [phone].

## 2022-01-09 ENCOUNTER — Ambulatory Visit (INDEPENDENT_AMBULATORY_CARE_PROVIDER_SITE_OTHER): Payer: 59 | Admitting: Family

## 2022-01-09 ENCOUNTER — Encounter: Payer: Self-pay | Admitting: Family

## 2022-01-09 VITALS — BP 160/98 | HR 97 | Temp 98.5°F | Resp 16 | Ht 61.0 in | Wt 133.4 lb

## 2022-01-09 DIAGNOSIS — Z79899 Other long term (current) drug therapy: Secondary | ICD-10-CM

## 2022-01-09 DIAGNOSIS — M5442 Lumbago with sciatica, left side: Secondary | ICD-10-CM | POA: Diagnosis not present

## 2022-01-09 DIAGNOSIS — R000 Tachycardia, unspecified: Secondary | ICD-10-CM

## 2022-01-09 DIAGNOSIS — M5441 Lumbago with sciatica, right side: Secondary | ICD-10-CM

## 2022-01-09 DIAGNOSIS — F1494 Cocaine use, unspecified with cocaine-induced mood disorder: Secondary | ICD-10-CM

## 2022-01-09 DIAGNOSIS — G8929 Other chronic pain: Secondary | ICD-10-CM

## 2022-01-09 MED ORDER — CYANOCOBALAMIN 1000 MCG/ML IJ SOLN
1000.0000 ug | Freq: Once | INTRAMUSCULAR | Status: AC
  Administered 2021-12-26: 1000 ug via INTRAMUSCULAR

## 2022-01-09 NOTE — Addendum Note (Signed)
Addended by: Donnamarie Poag on: 01/09/2022 03:00 PM   Modules accepted: Orders

## 2022-01-09 NOTE — Progress Notes (Signed)
Established Patient Office Visit  Subjective:  Patient ID: Kelsey Ho, female    DOB: 24-Nov-1990  Age: 31 y.o. MRN: [id number]  CC:  Chief Complaint  Patient presents with   Anxiety    HPI Kelsey Ho is here today for follow up.   Pt here for f/u on her anxiety and chronic pain.  She has no new changes in her medications, and she is not 100% clear on what she is taking regularly. She does report that occasionally she takes lyrica, but not always, and she takes trazadone but she can not tell me the dosage. She had an appointment that she missed yesterday with psychiatrist Dr. Toy Care. She also still sees pain management who is still filling her lorazepam and ativan.   Cocaine abuse, pt states that she has quit for a few weeks now, and repeats that she is sober and clean from cocaine.   Scheduled for care coordination for tomorrow 11/17 with Staci via [place]. Trying to establish with psychiatry and psychologist, having troubles finding assess so hoping this will be helpful for guidance and resources.   Overview: has offered her to connect with services and programs that were felt to be clinically appropriate, and so far recommendations have been declined.  Last visit was a few days ago with crisis center, however so far efforts have been declined.   For her pain today she states she has taken three ibuprofens, waited an hour and took three more ibuprofens. She also admits that she has not been eating as she is worried about her recent weight gain.   Blood pressure has been elevated, the last few days she reports about 150/98 on a regular basis. No cp or palpitations. Denies sob. She does state also not drinking a lot of water. She does report a constant frontal headace.   Past Medical History:  Diagnosis Date   Pinched nerve     Past Surgical History:  Procedure Laterality Date   COSMETIC SURGERY      Family History  Problem Relation Age of Onset   Kidney Stones Mother     Cancer - Lung Maternal Grandmother    Bone cancer Maternal Grandfather     Social History   Socioeconomic History   Marital status: Single    Spouse name: Not on file   Number of children: Not on file   Years of education: Not on file   Highest education level: Not on file  Occupational History   Not on file  Tobacco Use   Smoking status: Former    Types: Cigarettes   Smokeless tobacco: Never  Vaping Use   Vaping Use: Never used  Substance and Sexual Activity   Alcohol use: Yes    Comment: occ   Drug use: Yes    Types: Marijuana, Cocaine, LSD, Benzodiazepines   Sexual activity: Not Currently    Birth control/protection: Pill  Other Topics Concern   Not on file  Social History Narrative   Not on file   Social Determinants of Health   Financial Resource Strain: Not on file  Food Insecurity: No Food Insecurity (01/10/2022)   Hunger Vital Sign    Worried About Running Out of Food in the Last Year: Never true    Ran Out of Food in the Last Year: Never true  Transportation Needs: No Transportation Needs (01/10/2022)   PRAPARE - Hydrologist (Medical): No    Lack of Transportation (Non-Medical): No  Physical Activity:  Not on file  Stress: Not on file  Social Connections: Not on file  Intimate Partner Violence: Not on file    Outpatient Medications Prior to Visit  Medication Sig Dispense Refill   cyanocobalamin (VITAMIN B12) 1000 MCG/ML injection Inject into the muscle.     cyclobenzaprine (FLEXERIL) 10 MG tablet Take 10 mg by mouth 3 (three) times daily.     LO LOESTRIN FE 1 MG-10 MCG / 10 MCG tablet Take 1 tablet by mouth daily. 28 tablet 5   LORazepam (ATIVAN) 1 MG tablet Take 1 mg by mouth 3 (three) times daily.     pregabalin (LYRICA) 50 MG capsule Take 50 mg by mouth 2 (two) times daily. Taking 2 tab bid     zolpidem (AMBIEN CR) 12.5 MG CR tablet Take 10 mg by mouth at bedtime as needed.     No facility-administered medications prior  to visit.    Allergies  Allergen Reactions   Nortriptyline Hcl     Worsening mood         Objective:    Physical Exam Constitutional:      Appearance: Normal appearance.  Cardiovascular:     Rate and Rhythm: Normal rate and regular rhythm.  Pulmonary:     Effort: Pulmonary effort is normal.     Breath sounds: Normal breath sounds.  Neurological:     General: No focal deficit present.     Mental Status: She is alert and oriented to person, place, and time. Mental status is at baseline.  Psychiatric:        Attention and Perception: She perceives visual (improving per pt) hallucinations.        Mood and Affect: Mood is depressed. Affect is tearful.        Speech: Speech normal.        Behavior: Behavior is withdrawn. Behavior is cooperative.        Thought Content: Thought content normal. Thought content does not include homicidal or suicidal plan.        Judgment: Judgment normal.     BP (!) 160/98   Pulse 97   Temp 98.5 F (36.9 C)   Resp 16   Ht 5\' 1"  (1.549 m)   Wt 133 lb 6 oz (60.5 kg)   SpO2 100%   BMI 25.20 kg/m  Wt Readings from Last 3 Encounters:  01/09/22 133 lb 6 oz (60.5 kg)  12/26/21 131 lb 2 oz (59.5 kg)  10/29/21 121 lb 6 oz (55.1 kg)     Health Maintenance Due  Topic Date Due   COVID-19 Vaccine (1) Never done   HIV Screening  Never done   Hepatitis C Screening  Never done   PAP SMEAR-Modifier  Never done    There are no preventive care reminders to display for this patient.  Lab Results  Component Value Date   TSH 1.46 10/29/2021   Lab Results  Component Value Date   WBC 11.3 (H) 10/24/2021   HGB 15.5 (H) 10/24/2021   HCT 46.9 (H) 10/24/2021   MCV 85.3 10/24/2021   PLT 452 (H) 10/24/2021   Lab Results  Component Value Date   NA 143 10/24/2021   K 4.1 10/24/2021   CO2 28 10/24/2021   GLUCOSE 109 (H) 10/24/2021   BUN 11 10/24/2021   CREATININE 0.92 10/24/2021   BILITOT 1.1 07/23/2021   ALKPHOS 60 07/23/2021   AST 18  07/23/2021   ALT 15 07/23/2021   PROT 8.4 (H) 07/23/2021  ALBUMIN 5.0 07/23/2021   CALCIUM 10.2 10/24/2021   ANIONGAP 15 10/24/2021   No results found for: "CHOL" No results found for: "HDL" No results found for: "LDLCALC" No results found for: "TRIG" No results found for: "CHOLHDL" No results found for: "HGBA1C"    Assessment & Plan:   Problem List Items Addressed This Visit       Nervous and Auditory   Cocaine use with cocaine-induced mood disorder ([place]) - Primary    Pt states has not had in two weeks, however not sure completely.  Will continue to monitor. Did have long d/w pt on options, and advised her that inpatient rehab is great possibility for her. Advised her to be very cautious about mixing her pills unintentionally as could be fatal if wrong interaction along with cocaine use.       Chronic bilateral low back pain with bilateral sciatica    Ongoing, pt still c/o pain and not controlled.  Limited on medication options, pt for now to continue with exercises and try not to overuse in her work.        Other   Tachycardia    Intermittent dependent on use of medications and or cocaine. For now, stable.       High risk medication use    Have d/w with pharmacist in the past Review pdmp regularly however pain management and psychiatry still prescribe high risk medication, and pt does not take appropriately. She doubles doses at times and or takes inappropriate dose. Educated pt on how dangerous this is.       No orders of the defined types were placed in this encounter.   Follow-up: Return in about 1 week (around 01/16/2022) for f/u blood pressure.    Eugenia Pancoast, FNP

## 2022-01-10 ENCOUNTER — Ambulatory Visit: Payer: Self-pay | Admitting: *Deleted

## 2022-01-10 ENCOUNTER — Other Ambulatory Visit: Payer: Self-pay | Admitting: Family

## 2022-01-10 NOTE — Progress Notes (Signed)
Called and spoke with mom.  Mom states pt currently staying with her, she is drinking nyquil now she suspects that she is using this for sleeping. She is also taking increased lyrica now from 50 -100 mg once daily which seems to be increasing her hallucinations. Mom states she is not buying food for herself, she is hanging out with a guy named Josh who is 'her sugar daddy' who is giving her money and she is planning to buy more drugs.   Did let mom know [facility], and she is going to pass along the information.

## 2022-01-12 ENCOUNTER — Other Ambulatory Visit: Payer: Self-pay | Admitting: Family

## 2022-01-12 NOTE — Patient Instructions (Signed)
Visit Information  Thank you for taking time to visit with me today. Please don't hesitate to contact me if I can be of assistance to you.   Following are the goals we discussed today:   Goals Addressed             This Visit's Progress    "I am willing to consider seeing a therapist"       Care Coordination Interventions: Patient discussed chronic pain, anxiety and difficulty sleeping  Currently lives with family, has a Chemical engineer Patient states that she did not keep appointment with Dr. Mayo Ao on 01/08/22 Patient willing to consider follow up with a therapist in addition to medication management-CSW to follow up with options for ongoing mental health treatment Patient denies current thoughts of harm to self or others -patient encouraged to contact the 988 Crisis Line Current coping strategies discussed, self  care emphasized PHQ2/PHQ9 completed Solution-Focused Strategies employed:  Active listening / Reflection utilized  Participation in counseling encouraged          Our next appointment is by telephone on 01/24/22 at 2pm  Please call the care guide team at [phone] if you need to cancel or reschedule your appointment.   If you are experiencing a Mental Health or Behavioral Health Crisis or need someone to talk to, please call the Suicide and Crisis Lifeline: 988   Patient verbalizes understanding of instructions and care plan provided today and agrees to view in MyChart. Active MyChart status and patient understanding of how to access instructions and care plan via MyChart confirmed with patient.     Telephone follow up appointment with care management team member scheduled for: 01/24/22  Verna Czech, LCSW Clinical Social Worker  [facility] Care Management [phone]

## 2022-01-12 NOTE — Patient Outreach (Signed)
  Care Coordination   Initial Visit Note   01/12/2022 Name: Danijah Noh MRN: [id number] DOB: 1990-08-18  Bailei Buist is a 31 y.o. year old female who sees Mort Sawyers, FNP for primary care. I spoke with  Karl Bales by phone today.  What matters to the patients health and wellness today?  Mental Health Follow up    Goals Addressed             This Visit's Progress    "I am willing to consider seeing a therapist"       Care Coordination Interventions: Patient discussed chronic pain, anxiety and difficulty sleeping  Currently lives with family, has a Chemical engineer Patient states that she did not keep appointment with Dr. Mayo Ao on 01/08/22 Patient willing to consider follow up with a therapist in addition to medication management-CSW to follow up with options for ongoing mental health treatment Patient denies current thoughts of harm to self or others -patient encouraged to contact the 9[address] Crisis Line Current coping strategies discussed, self  care emphasized PHQ2/PHQ9 completed Solution-Focused Strategies employed:  Active listening / Reflection utilized  Participation in counseling encouraged          SDOH assessments and interventions completed:  Yes  SDOH Interventions Today    Flowsheet Row Most Recent Value  SDOH Interventions   Food Insecurity Interventions Intervention Not Indicated  Housing Interventions Intervention Not Indicated  Transportation Interventions Intervention Not Indicated        Care Coordination Interventions Activated:  Yes  Care Coordination Interventions:  Yes, provided   Follow up plan: Follow up call scheduled for 01/24/22    Encounter Outcome:  Pt. Visit Completed

## 2022-01-13 ENCOUNTER — Other Ambulatory Visit: Payer: Self-pay | Admitting: Family

## 2022-01-13 ENCOUNTER — Other Ambulatory Visit (HOSPITAL_COMMUNITY): Payer: Self-pay

## 2022-01-14 NOTE — Assessment & Plan Note (Signed)
Have d/w with pharmacist in the past Review pdmp regularly however pain management and psychiatry still prescribe high risk medication, and pt does not take appropriately. She doubles doses at times and or takes inappropriate dose. Educated pt on how dangerous this is.

## 2022-01-14 NOTE — Assessment & Plan Note (Addendum)
Pt states has not had in two weeks, however not sure completely.  Will continue to monitor. Did have long d/w pt on options, and advised her that inpatient rehab is great possibility for her. Advised her to be very cautious about mixing her pills unintentionally as could be fatal if wrong interaction along with cocaine use.

## 2022-01-14 NOTE — Assessment & Plan Note (Addendum)
Ongoing, pt still c/o pain and not controlled.  Limited on medication options, pt for now to continue with exercises and try not to overuse in her work.

## 2022-01-14 NOTE — Assessment & Plan Note (Signed)
Intermittent dependent on use of medications and or cocaine. For now, stable.

## 2022-01-20 ENCOUNTER — Other Ambulatory Visit: Payer: Self-pay | Admitting: Family

## 2022-01-24 ENCOUNTER — Encounter: Payer: Self-pay | Admitting: *Deleted

## 2022-01-24 ENCOUNTER — Telehealth: Payer: Self-pay | Admitting: *Deleted

## 2022-01-24 NOTE — Patient Outreach (Signed)
  Care Coordination   01/24/2022 Name: Azelia Reiger MRN: [id number] DOB: 07-28-1990   Care Coordination Outreach Attempts:  An unsuccessful telephone outreach was attempted for a scheduled appointment today.  Follow Up Plan:  Additional outreach attempts will be made to offer the patient care coordination information and services.   Encounter Outcome:  No Answer   Care Coordination Interventions:  No, not indicated    Dorothe Elmore, LCSW Clinical Social Worker  [facility] Care Management [phone]

## 2022-02-03 DIAGNOSIS — G894 Chronic pain syndrome: Secondary | ICD-10-CM | POA: Diagnosis not present

## 2022-02-03 DIAGNOSIS — Z79899 Other long term (current) drug therapy: Secondary | ICD-10-CM | POA: Diagnosis not present

## 2022-02-03 DIAGNOSIS — M545 Low back pain, unspecified: Secondary | ICD-10-CM | POA: Diagnosis not present

## 2022-02-03 DIAGNOSIS — M5416 Radiculopathy, lumbar region: Secondary | ICD-10-CM | POA: Diagnosis not present

## 2022-02-03 DIAGNOSIS — F11188 Opioid abuse with other opioid-induced disorder: Secondary | ICD-10-CM | POA: Diagnosis not present

## 2022-02-03 DIAGNOSIS — Z79891 Long term (current) use of opiate analgesic: Secondary | ICD-10-CM | POA: Diagnosis not present

## 2022-03-19 ENCOUNTER — Emergency Department
Admission: EM | Admit: 2022-03-19 | Discharge: 2022-03-19 | Disposition: A | Discharge status: Home / Self Care | Payer: Medicaid Other | Attending: Emergency Medicine | Admitting: Emergency Medicine

## 2022-03-19 ENCOUNTER — Other Ambulatory Visit: Payer: Self-pay

## 2022-03-19 DIAGNOSIS — Z760 Encounter for issue of repeat prescription: Secondary | ICD-10-CM | POA: Insufficient documentation

## 2022-03-19 MED ORDER — CYANOCOBALAMIN 1000 MCG/ML IJ SOLN
1000.0000 ug | INTRAMUSCULAR | 1 refills | Status: DC
  Filled 2022-03-19: qty 1, 14d supply, fill #0
  Filled 2022-06-19 – 2022-06-20 (×3): qty 1, 14d supply, fill #1

## 2022-03-19 MED ORDER — ACETAMINOPHEN 325 MG PO TABS
650.0000 mg | ORAL_TABLET | Freq: Once | ORAL | Status: AC
  Administered 2022-03-19: 650 mg via ORAL
  Filled 2022-03-19: qty 2

## 2022-03-19 NOTE — ED Provider Notes (Signed)
[facility] Provider Note  Patient Contact: 5:28 PM (approximate)   History   Medication Refill   HPI  Kelsey Ho is a 32 y.o. female who presents the emergency department complaining of body pain everywhere.  Patient is requesting refills of her medications.  Patient is on Suboxone, lorazepam, Ambien, Lyrica as well as other medications.  Patient states that she needs refills of these controlled substances.  Patient is very confused, appears to be either under the influence of a controlled substance currently or illicit substance.  Patient is unable to keep her story straight currently.  Reportedly patient could not be seen by a provider who writes her controlled substances, but then informs me that she actually saw them yesterday.  She then informed me that she could not pick up her medications due to the cost and her switching insurances but also states that she could pick up her medications but they were not called in.  Patient is requesting that I write her for controlled substances.  I have informed her that at this time I will not be refilling any of her controlled substances.  I have reviewed the patient's medical record.  She is on these for controlled substances, it appears that her primary care also does not feel comfortable writing these medications and patient is having to be seen at a pain management specialist.  Primary care notes that the patient has had intermittent illicit substance use including recently cocaine.  Patient is unable to refill any of her controlled substances even temporarily at her primary care.  Patient denies any new symptoms, is requesting medication/medication refill only     Physical Exam   Triage Vital Signs: ED Triage Vitals  Enc Vitals Group     BP 03/19/22 1627 (!) 128/103     Pulse Rate 03/19/22 1627 82     Resp 03/19/22 1627 18     Temp 03/19/22 1627 98 F (36.7 C)     Temp src --      SpO2 03/19/22 1627 95 %      Weight --      Height --      Head Circumference --      Peak Flow --      Pain Score 03/19/22 1626 0     Pain Loc --      Pain Edu? --      Excl. in [place]? --     Most recent vital signs: Vitals:   03/19/22 1627  BP: (!) 128/103  Pulse: 82  Resp: 18  Temp: 98 F (36.7 C)  SpO2: 95%     General: Alert and in no acute distress.  Patient appears under the influence/intoxicated at this time.   Cardiovascular:  Good peripheral perfusion Respiratory: Normal respiratory effort without tachypnea or retractions. Lungs CTAB. Good air entry to the bases with no decreased or absent breath sounds Musculoskeletal: Full range of motion to all extremities.  Neurologic:  No gross focal neurologic deficits are appreciated.  Skin:   No rash noted Other:   ED Results / Procedures / Treatments   Labs (all labs ordered are listed, but only abnormal results are displayed) Labs Reviewed - No data to display   EKG     RADIOLOGY    No results found.  PROCEDURES:  Critical Care performed: No  Procedures   MEDICATIONS ORDERED IN ED: Medications  acetaminophen (TYLENOL) tablet 650 mg (has no administration in time range)  IMPRESSION / MDM / ASSESSMENT AND PLAN / ED COURSE  I reviewed the triage vital signs and the nursing notes.                                 Differential diagnosis includes, but is not limited to, medication refill, chronic pain  Patient's presentation is most consistent with acute presentation with potential threat to life or bodily function.   Patient's diagnosis is consistent with encounter for medication refill patient presents emergency department requesting that I refill her for controlled substance prescriptions.  Patient is on lorazepam, Ambien, Lyrica and Suboxone.  Patient sees pain management for same.  According to the controlled substance database patient should have current prescriptions for all 4 medications.  Patient is reporting that she  is out of all these medications.  If so, patient has completed courses of medication too soon.  Patient does have a primary care provider who follows her, however primary care is concerned given the fact that patient has intermittent illicit substance use, is routinely short on her medication counts, is known to double and triple her medications that are prescribed.  Patient appears with the appearance of being either intoxicated or under the influence of controlled substance at this time.  Patient has a varying story where she was both able and unable to see her pain medication specialist yesterday, has both prescription for the medication at the pharmacy but no insurance, but also has insurance but the pharmacy does not have her prescriptions.  Patient is drastically changing her story every time I asked for further clarification.  At this time I have informed the patient that I will not refill any controlled substances.  If patient has any request for further prescriptions they must be made to her pain management specialist.  At this time she is requesting a refill of her vitamin B12 injections.  She states that she is supposed to be taking these at home every 2 weeks.  This will be refilled for the patient.  However I will not fill prescription for other medications.  Patient has been asking for narcotics here in the emergency department and have informed the patient that at this time if she is currently taking the Suboxone for which she is prescribed this can trains Narcan and will not allow the opiate to work.  Also patient has a history of opiate abuse and this is the reason she is currently on Suboxone.  As such I will not provide narcotics here in the emergency department either.  Patient may follow-up with her pain management specialist or primary care.  No indication for workup today.  Patient is discharged..  Patient is given ED precautions to return to the ED for any worsening or new  symptoms.     FINAL CLINICAL IMPRESSION(S) / ED DIAGNOSES   Final diagnoses:  Medication refill     Rx / DC Orders   ED Discharge Orders          Ordered    cyanocobalamin (VITAMIN B12) 1000 MCG/ML injection  Every 14 days        03/19/22 1744             Note:  This document was prepared using Dragon voice recognition software and may include unintentional dictation errors.   Brynda Peon 03/19/22 1800    Lavonia Drafts, MD 03/19/22 [phone]

## 2022-03-19 NOTE — ED Triage Notes (Signed)
Pt comes with c/o needing medication refilled since insurance lapsed.

## 2022-03-20 ENCOUNTER — Other Ambulatory Visit: Payer: Self-pay

## 2022-03-21 ENCOUNTER — Other Ambulatory Visit: Payer: Self-pay

## 2022-03-31 ENCOUNTER — Other Ambulatory Visit: Payer: Self-pay

## 2022-04-28 DIAGNOSIS — F419 Anxiety disorder, unspecified: Secondary | ICD-10-CM | POA: Diagnosis not present

## 2022-04-28 DIAGNOSIS — M5416 Radiculopathy, lumbar region: Secondary | ICD-10-CM | POA: Diagnosis not present

## 2022-04-28 DIAGNOSIS — F11188 Opioid abuse with other opioid-induced disorder: Secondary | ICD-10-CM | POA: Diagnosis not present

## 2022-04-28 DIAGNOSIS — G894 Chronic pain syndrome: Secondary | ICD-10-CM | POA: Diagnosis not present

## 2022-04-28 DIAGNOSIS — Z79899 Other long term (current) drug therapy: Secondary | ICD-10-CM | POA: Diagnosis not present

## 2022-04-28 DIAGNOSIS — Z79891 Long term (current) use of opiate analgesic: Secondary | ICD-10-CM | POA: Diagnosis not present

## 2022-04-28 DIAGNOSIS — M545 Low back pain, unspecified: Secondary | ICD-10-CM | POA: Diagnosis not present

## 2022-06-16 DIAGNOSIS — F419 Anxiety disorder, unspecified: Secondary | ICD-10-CM | POA: Diagnosis not present

## 2022-06-16 DIAGNOSIS — M545 Low back pain, unspecified: Secondary | ICD-10-CM | POA: Diagnosis not present

## 2022-06-16 DIAGNOSIS — F11188 Opioid abuse with other opioid-induced disorder: Secondary | ICD-10-CM | POA: Diagnosis not present

## 2022-06-16 DIAGNOSIS — Z79891 Long term (current) use of opiate analgesic: Secondary | ICD-10-CM | POA: Diagnosis not present

## 2022-06-16 DIAGNOSIS — G894 Chronic pain syndrome: Secondary | ICD-10-CM | POA: Diagnosis not present

## 2022-06-16 DIAGNOSIS — Z79899 Other long term (current) drug therapy: Secondary | ICD-10-CM | POA: Diagnosis not present

## 2022-06-16 DIAGNOSIS — M5416 Radiculopathy, lumbar region: Secondary | ICD-10-CM | POA: Diagnosis not present

## 2022-06-19 ENCOUNTER — Other Ambulatory Visit: Payer: Self-pay | Admitting: Family

## 2022-06-20 ENCOUNTER — Other Ambulatory Visit: Payer: Self-pay

## 2022-06-20 ENCOUNTER — Other Ambulatory Visit (HOSPITAL_COMMUNITY): Payer: Self-pay

## 2022-06-20 ENCOUNTER — Other Ambulatory Visit: Payer: Self-pay | Admitting: Family

## 2022-06-28 ENCOUNTER — Other Ambulatory Visit: Payer: Self-pay | Admitting: Family

## 2022-07-11 DIAGNOSIS — F900 Attention-deficit hyperactivity disorder, predominantly inattentive type: Secondary | ICD-10-CM | POA: Diagnosis not present

## 2022-07-14 DIAGNOSIS — F11188 Opioid abuse with other opioid-induced disorder: Secondary | ICD-10-CM | POA: Diagnosis not present

## 2022-07-14 DIAGNOSIS — M545 Low back pain, unspecified: Secondary | ICD-10-CM | POA: Diagnosis not present

## 2022-07-14 DIAGNOSIS — G894 Chronic pain syndrome: Secondary | ICD-10-CM | POA: Diagnosis not present

## 2022-07-14 DIAGNOSIS — Z79891 Long term (current) use of opiate analgesic: Secondary | ICD-10-CM | POA: Diagnosis not present

## 2022-07-14 DIAGNOSIS — Z79899 Other long term (current) drug therapy: Secondary | ICD-10-CM | POA: Diagnosis not present

## 2022-07-14 DIAGNOSIS — M5416 Radiculopathy, lumbar region: Secondary | ICD-10-CM | POA: Diagnosis not present

## 2022-07-14 DIAGNOSIS — F419 Anxiety disorder, unspecified: Secondary | ICD-10-CM | POA: Diagnosis not present

## 2022-08-11 DIAGNOSIS — Z79891 Long term (current) use of opiate analgesic: Secondary | ICD-10-CM | POA: Diagnosis not present

## 2022-08-11 DIAGNOSIS — M5416 Radiculopathy, lumbar region: Secondary | ICD-10-CM | POA: Diagnosis not present

## 2022-08-11 DIAGNOSIS — F11188 Opioid abuse with other opioid-induced disorder: Secondary | ICD-10-CM | POA: Diagnosis not present

## 2022-08-11 DIAGNOSIS — F419 Anxiety disorder, unspecified: Secondary | ICD-10-CM | POA: Diagnosis not present

## 2022-08-11 DIAGNOSIS — M545 Low back pain, unspecified: Secondary | ICD-10-CM | POA: Diagnosis not present

## 2022-08-11 DIAGNOSIS — G894 Chronic pain syndrome: Secondary | ICD-10-CM | POA: Diagnosis not present

## 2022-08-11 DIAGNOSIS — Z79899 Other long term (current) drug therapy: Secondary | ICD-10-CM | POA: Diagnosis not present

## 2022-09-16 ENCOUNTER — Other Ambulatory Visit (HOSPITAL_COMMUNITY): Payer: Self-pay

## 2022-09-16 ENCOUNTER — Other Ambulatory Visit: Payer: Self-pay | Admitting: Family

## 2022-09-30 ENCOUNTER — Ambulatory Visit: Payer: Medicaid Other | Admitting: Dermatology

## 2022-10-03 DIAGNOSIS — F411 Generalized anxiety disorder: Secondary | ICD-10-CM | POA: Diagnosis not present

## 2022-10-03 DIAGNOSIS — F900 Attention-deficit hyperactivity disorder, predominantly inattentive type: Secondary | ICD-10-CM | POA: Diagnosis not present

## 2022-10-04 ENCOUNTER — Other Ambulatory Visit (HOSPITAL_COMMUNITY): Payer: Self-pay

## 2022-10-21 ENCOUNTER — Other Ambulatory Visit: Payer: Self-pay | Admitting: Family

## 2022-10-21 ENCOUNTER — Other Ambulatory Visit (HOSPITAL_COMMUNITY): Payer: Self-pay

## 2022-10-22 ENCOUNTER — Other Ambulatory Visit (HOSPITAL_COMMUNITY): Payer: Self-pay

## 2022-11-10 ENCOUNTER — Other Ambulatory Visit (HOSPITAL_COMMUNITY): Payer: Self-pay

## 2022-11-10 ENCOUNTER — Other Ambulatory Visit: Payer: Self-pay

## 2022-11-10 DIAGNOSIS — Z79899 Other long term (current) drug therapy: Secondary | ICD-10-CM | POA: Diagnosis not present

## 2022-11-10 DIAGNOSIS — Z79891 Long term (current) use of opiate analgesic: Secondary | ICD-10-CM | POA: Diagnosis not present

## 2022-11-10 DIAGNOSIS — G894 Chronic pain syndrome: Secondary | ICD-10-CM | POA: Diagnosis not present

## 2022-11-10 DIAGNOSIS — F11188 Opioid abuse with other opioid-induced disorder: Secondary | ICD-10-CM | POA: Diagnosis not present

## 2022-11-10 DIAGNOSIS — M5416 Radiculopathy, lumbar region: Secondary | ICD-10-CM | POA: Diagnosis not present

## 2022-11-10 DIAGNOSIS — M545 Low back pain, unspecified: Secondary | ICD-10-CM | POA: Diagnosis not present

## 2022-11-10 DIAGNOSIS — F419 Anxiety disorder, unspecified: Secondary | ICD-10-CM | POA: Diagnosis not present

## 2022-11-10 MED ORDER — CYANOCOBALAMIN 1000 MCG/ML IJ SOLN
1000.0000 ug | INTRAMUSCULAR | 2 refills | Status: DC
  Filled 2022-11-10 (×2): qty 1, 30d supply, fill #0
  Filled 2022-12-08: qty 1, 30d supply, fill #1
  Filled 2022-12-08: qty 1, 30d supply, fill #0
  Filled 2022-12-16 – 2023-02-02 (×3): qty 1, 30d supply, fill #1

## 2022-11-24 ENCOUNTER — Other Ambulatory Visit: Payer: Self-pay

## 2022-12-04 ENCOUNTER — Ambulatory Visit: Payer: Medicaid Other | Admitting: Dermatology

## 2022-12-08 ENCOUNTER — Other Ambulatory Visit: Payer: Self-pay | Admitting: Family

## 2022-12-08 ENCOUNTER — Other Ambulatory Visit (HOSPITAL_COMMUNITY): Payer: Self-pay

## 2022-12-08 ENCOUNTER — Other Ambulatory Visit: Payer: Self-pay

## 2022-12-09 ENCOUNTER — Other Ambulatory Visit: Payer: Self-pay | Admitting: Family

## 2022-12-09 ENCOUNTER — Other Ambulatory Visit: Payer: Self-pay

## 2022-12-09 ENCOUNTER — Other Ambulatory Visit (HOSPITAL_COMMUNITY): Payer: Self-pay

## 2022-12-09 MED ORDER — LO LOESTRIN FE 1 MG-10 MCG / 10 MCG PO TABS
1.0000 | ORAL_TABLET | Freq: Every day | ORAL | 0 refills | Status: DC
  Filled 2022-12-09 – 2022-12-16 (×2): qty 28, 28d supply, fill #0

## 2022-12-10 ENCOUNTER — Other Ambulatory Visit: Payer: Self-pay | Admitting: Family

## 2022-12-11 ENCOUNTER — Other Ambulatory Visit: Payer: Self-pay

## 2022-12-15 ENCOUNTER — Other Ambulatory Visit (HOSPITAL_COMMUNITY): Payer: Self-pay

## 2022-12-15 MED ORDER — ZOLPIDEM TARTRATE 10 MG PO TABS
10.0000 mg | ORAL_TABLET | Freq: Every evening | ORAL | 1 refills | Status: DC | PRN
  Filled 2022-12-15 – 2022-12-16 (×2): qty 30, 30d supply, fill #0
  Filled 2023-02-02: qty 15, 30d supply, fill #0
  Filled 2023-02-03 (×3): qty 15, 30d supply, fill #1
  Filled 2023-02-04: qty 15, 30d supply, fill #2
  Filled 2023-02-04 (×2): qty 15, 15d supply, fill #1
  Filled 2023-02-04: qty 15, 30d supply, fill #1
  Filled 2023-02-10 – 2023-02-12 (×2): qty 15, 30d supply, fill #2
  Filled 2023-02-17 – 2023-04-16 (×3): qty 15, 30d supply, fill #3

## 2022-12-15 MED ORDER — ONDANSETRON HCL 8 MG PO TABS
8.0000 mg | ORAL_TABLET | Freq: Three times a day (TID) | ORAL | 1 refills | Status: DC | PRN
  Filled 2022-12-15: qty 90, 30d supply, fill #0
  Filled 2022-12-16: qty 18, 21d supply, fill #0

## 2022-12-15 MED ORDER — TIZANIDINE HCL 4 MG PO TABS
4.0000 mg | ORAL_TABLET | Freq: Four times a day (QID) | ORAL | 1 refills | Status: DC | PRN
Start: 2022-12-15 — End: 2023-02-12
  Filled 2022-12-15 – 2023-02-03 (×7): qty 120, 30d supply, fill #0
  Filled 2023-02-04: qty 120, 30d supply, fill #1
  Filled 2023-02-04 (×3): qty 120, 30d supply, fill #0
  Filled 2023-02-10 – 2023-02-12 (×2): qty 120, 30d supply, fill #1

## 2022-12-15 MED ORDER — CYCLOBENZAPRINE HCL 10 MG PO TABS
10.0000 mg | ORAL_TABLET | Freq: Every day | ORAL | 1 refills | Status: DC
  Filled 2022-12-15 – 2023-02-23 (×15): qty 30, 30d supply, fill #0
  Filled 2023-02-26 – 2023-03-26 (×3): qty 30, 30d supply, fill #1

## 2022-12-15 MED ORDER — GABAPENTIN 300 MG PO CAPS
300.0000 mg | ORAL_CAPSULE | Freq: Three times a day (TID) | ORAL | 1 refills | Status: DC
  Filled 2022-12-15 – 2023-03-09 (×20): qty 90, 30d supply, fill #0

## 2022-12-17 ENCOUNTER — Other Ambulatory Visit: Payer: Self-pay

## 2022-12-22 ENCOUNTER — Other Ambulatory Visit: Payer: Self-pay

## 2022-12-22 ENCOUNTER — Ambulatory Visit: Payer: Medicaid Other | Admitting: Dermatology

## 2022-12-26 ENCOUNTER — Other Ambulatory Visit: Payer: Self-pay

## 2022-12-26 ENCOUNTER — Other Ambulatory Visit (HOSPITAL_COMMUNITY): Payer: Self-pay

## 2022-12-30 ENCOUNTER — Other Ambulatory Visit: Payer: Self-pay

## 2022-12-31 ENCOUNTER — Other Ambulatory Visit: Payer: Self-pay

## 2023-01-02 ENCOUNTER — Other Ambulatory Visit: Payer: Self-pay

## 2023-01-02 ENCOUNTER — Other Ambulatory Visit (HOSPITAL_COMMUNITY): Payer: Self-pay

## 2023-01-05 ENCOUNTER — Other Ambulatory Visit: Payer: Self-pay

## 2023-01-11 ENCOUNTER — Emergency Department: Payer: MEDICAID

## 2023-01-11 ENCOUNTER — Emergency Department
Admission: EM | Admit: 2023-01-11 | Discharge: 2023-01-11 | Disposition: A | Discharge status: Home / Self Care | Payer: MEDICAID | Attending: Student in an Organized Health Care Education/Training Program | Admitting: Student in an Organized Health Care Education/Training Program

## 2023-01-11 ENCOUNTER — Other Ambulatory Visit: Payer: Self-pay

## 2023-01-11 ENCOUNTER — Encounter: Payer: Self-pay | Admitting: Emergency Medicine

## 2023-01-11 DIAGNOSIS — S82832A Other fracture of upper and lower end of left fibula, initial encounter for closed fracture: Secondary | ICD-10-CM | POA: Insufficient documentation

## 2023-01-11 DIAGNOSIS — S8992XA Unspecified injury of left lower leg, initial encounter: Secondary | ICD-10-CM | POA: Diagnosis present

## 2023-01-11 DIAGNOSIS — R300 Dysuria: Secondary | ICD-10-CM | POA: Diagnosis not present

## 2023-01-11 DIAGNOSIS — Y9301 Activity, walking, marching and hiking: Secondary | ICD-10-CM | POA: Insufficient documentation

## 2023-01-11 DIAGNOSIS — X509XXA Other and unspecified overexertion or strenuous movements or postures, initial encounter: Secondary | ICD-10-CM | POA: Insufficient documentation

## 2023-01-11 LAB — URINALYSIS, ROUTINE W REFLEX MICROSCOPIC
Bilirubin Urine: NEGATIVE
Glucose, UA: >=500 mg/dL — AB
Ketones, ur: NEGATIVE mg/dL
Nitrite: NEGATIVE
Protein, ur: NEGATIVE mg/dL
Specific Gravity, Urine: 1.003 — ABNORMAL LOW (ref 1.005–1.030)
WBC, UA: >50 WBC/hpf (ref 0–5)
pH: 7 (ref 5.0–8.0)

## 2023-01-11 LAB — CBC
HCT: 44.2 % (ref 36.0–46.0)
Hemoglobin: 14.7 g/dL (ref 12.0–15.0)
MCH: 28.7 pg (ref 26.0–34.0)
MCHC: 33.3 g/dL (ref 30.0–36.0)
MCV: 86.3 fL (ref 80.0–100.0)
Platelets: 317 10*3/uL (ref 150–400)
RBC: 5.12 MIL/uL — ABNORMAL HIGH (ref 3.87–5.11)
RDW: 13.1 % (ref 11.5–15.5)
WBC: 10.1 10*3/uL (ref 4.0–10.5)
nRBC: 0 % (ref 0.0–0.2)

## 2023-01-11 LAB — BASIC METABOLIC PANEL
Anion gap: 11 (ref 5–15)
BUN: 11 mg/dL (ref 6–20)
CO2: 27 mmol/L (ref 22–32)
Calcium: 9.3 mg/dL (ref 8.9–10.3)
Chloride: 98 mmol/L (ref 98–111)
Creatinine, Ser: 1.2 mg/dL — ABNORMAL HIGH (ref 0.44–1.00)
GFR, Estimated: >60 mL/min (ref >60)
Glucose, Bld: 115 mg/dL — ABNORMAL HIGH (ref 70–99)
Potassium: 3.4 mmol/L — ABNORMAL LOW (ref 3.5–5.1)
Sodium: 136 mmol/L (ref 135–145)

## 2023-01-11 MED ORDER — SODIUM CHLORIDE 0.9 % IV BOLUS
1000.0000 mL | Freq: Once | INTRAVENOUS | Status: AC
  Administered 2023-01-11: 1000 mL via INTRAVENOUS

## 2023-01-11 MED ORDER — OXYCODONE-ACETAMINOPHEN 5-325 MG PO TABS
1.0000 | ORAL_TABLET | Freq: Once | ORAL | Status: AC
  Administered 2023-01-11: 1 via ORAL
  Filled 2023-01-11: qty 1

## 2023-01-11 MED ORDER — OXYCODONE-ACETAMINOPHEN 5-325 MG PO TABS: 1.0000 | ORAL_TABLET | ORAL | 0 refills | Status: DC | PRN

## 2023-01-11 MED ORDER — CEPHALEXIN 500 MG PO CAPS: 500.0000 mg | ORAL_CAPSULE | Freq: Two times a day (BID) | ORAL | 0 refills | Status: AC

## 2023-01-11 MED ORDER — KETOROLAC TROMETHAMINE 30 MG/ML IJ SOLN
15.0000 mg | Freq: Once | INTRAMUSCULAR | Status: AC
  Administered 2023-01-11: 15 mg via INTRAVENOUS
  Filled 2023-01-11: qty 1

## 2023-01-11 MED ORDER — SODIUM CHLORIDE 0.9 % IV BOLUS
1000.0000 mL | Freq: Once | INTRAVENOUS | Status: AC
Start: 2023-01-11 — End: 2023-01-11
  Administered 2023-01-11: 1000 mL via INTRAVENOUS

## 2023-01-11 NOTE — ED Notes (Addendum)
Pt reports improvement of dizziness. Repeat BP 112/74. Pt has received 800cc of NS.

## 2023-01-11 NOTE — ED Notes (Signed)
See triage note Presents s/p fall  States she fell yesterday when her dog got away from her  Then while walking the dog this am she heard a pop in left ankle  Pos swelling   Good pulses

## 2023-01-11 NOTE — ED Provider Notes (Signed)
[facility] Provider Note    Event Date/Time   First MD Initiated Contact with Patient 01/11/23 1330     (approximate)   History   Fall   HPI  Kelsey Ho is a 32 y.o. female who presents to the ER for evaluation of acute left ankle pain occurred after she was walking rolled her ankle and felt a popping sensation.  Patient recently got back from [place] states she is having some dysuria.  Denies any nausea or vomiting.  Did take some pain medication prior to arrival has not had anything to eat was noted to be hypotensive on arrival given some IV fluids with resolution.  She otherwise feels well at this time.     Physical Exam   Triage Vital Signs: ED Triage Vitals  Encounter Vitals Group     BP 01/11/23 1217 (!) 68/39     Systolic BP Percentile --      Diastolic BP Percentile --      Pulse Rate 01/11/23 1219 94     Resp 01/11/23 1219 20     Temp 01/11/23 1219 98 F (36.7 C)     Temp Source 01/11/23 1219 Oral     SpO2 01/11/23 1219 100 %     Weight 01/11/23 1344 133 lb 6.1 oz (60.5 kg)     Height 01/11/23 1344 5\' 1"  (1.549 m)     Head Circumference --      Peak Flow --      Pain Score 01/11/23 1226 10     Pain Loc --      Pain Education --      Exclude from Growth Chart --     Most recent vital signs: Vitals:   01/11/23 1307 01/11/23 1525  BP: 112/74 125/83  Pulse: 64 85  Resp: 20 16  Temp:  98.7 F (37.1 C)  SpO2: 98% 96%     Constitutional: Alert  Eyes: Conjunctivae are normal.  Head: Atraumatic. Nose: No congestion/rhinnorhea. Mouth/Throat: Mucous membranes are moist.   Neck: Painless ROM.  Cardiovascular:   Good peripheral circulation. Respiratory: Normal respiratory effort.  No retractions.  Gastrointestinal: Soft and nontender.  Musculoskeletal: Swelling to lateral left ankle neurovascular intact distally.  No proximal tenderness palpation Neurologic:  MAE spontaneously. No gross focal neurologic deficits are appreciated.   Skin:  Skin is warm, dry and intact. No rash noted. Psychiatric: Mood and affect are normal. Speech and behavior are normal.    ED Results / Procedures / Treatments   Labs (all labs ordered are listed, but only abnormal results are displayed) Labs Reviewed  CBC - Abnormal; Notable for the following components:      Result Value   RBC 5.12 (*)    All other components within normal limits  BASIC METABOLIC PANEL - Abnormal; Notable for the following components:   Potassium 3.4 (*)    Glucose, Bld 115 (*)    Creatinine, Ser 1.20 (*)    All other components within normal limits  URINALYSIS, ROUTINE W REFLEX MICROSCOPIC - Abnormal; Notable for the following components:   Color, Urine YELLOW (*)    APPearance HAZY (*)    Specific Gravity, Urine 1.003 (*)    Glucose, UA >=500 (*)    Hgb urine dipstick SMALL (*)    Leukocytes,Ua LARGE (*)    Bacteria, UA RARE (*)    All other components within normal limits     EKG     RADIOLOGY Please see ED  Course for my review and interpretation.  I personally reviewed all radiographic images ordered to evaluate for the above acute complaints and reviewed radiology reports and findings.  These findings were personally discussed with the patient.  Please see medical record for radiology report.    PROCEDURES:  Critical Care performed: No  .Ortho Injury Treatment  Date/Time: 01/11/2023 2:16 PM  Performed by: Willy Eddy, MD Authorized by: Willy Eddy, MD  Injury location: ankle Location details: left ankle Injury type: fracture Fracture type: lateral malleolus Pre-procedure neurovascular assessment: neurovascularly intact Immobilization: splint Splint type: short leg Splint Applied by: ED Provider Supplies used: Ortho-Glass Post-procedure neurovascular assessment: post-procedure neurovascularly intact      MEDICATIONS ORDERED IN ED: Medications  sodium chloride 0.9 % bolus 1,000 mL (0 mLs Intravenous Stopped  01/11/23 1343)  ketorolac (TORADOL) 30 MG/ML injection 15 mg (15 mg Intravenous Given 01/11/23 1416)  sodium chloride 0.9 % bolus 1,000 mL (1,000 mLs Intravenous New Bag/Given 01/11/23 1416)  oxyCODONE-acetaminophen (PERCOCET/ROXICET) 5-325 MG per tablet 1 tablet (1 tablet Oral Given 01/11/23 1437)     IMPRESSION / MDM / ASSESSMENT AND PLAN / ED COURSE  I reviewed the triage vital signs and the nursing notes.                              Differential diagnosis includes, but is not limited to, fracture, contusion, electrolyte abnormality, UTI, sepsis  Patient presenting to the ER for evaluation of symptoms as described above.  Based on symptoms, risk factors and considered above differential, this presenting complaint could reflect a potentially life-threatening illness therefore the patient will be placed on continuous pulse oximetry and telemetry for monitoring.  Laboratory evaluation will be sent to evaluate for the above complaints.     Blood work reassuring.  Patient improved after IV fluids.  She is tolerating p.o.  Does have evidence of UTI.  She is not septic.  Will place on antibiotic.  Will give referral to orthopedic.     FINAL CLINICAL IMPRESSION(S) / ED DIAGNOSES   Final diagnoses:  Other closed fracture of distal end of left fibula, initial encounter     Rx / DC Orders   ED Discharge Orders          Ordered    oxyCODONE-acetaminophen (PERCOCET) 5-325 MG tablet  Every 4 hours PRN        01/11/23 1417             Note:  This document was prepared using Dragon voice recognition software and may include unintentional dictation errors.    Willy Eddy, MD 01/11/23 [phone]

## 2023-01-11 NOTE — ED Triage Notes (Addendum)
Pt to ED via POV from home. Pt reports yesterday she was walking her dogs and they got away from her and dragged her on the ground. Pt reports left ankle and left knee pain. Pt reports heard a pop today. Pt BP in triage 68/39 and repeat 69/48. Pt reports last pain medication or muscle relaxer 24hrs ago. Pt denies drug or etoh use. Pt reports some dizziness. DP pulse present

## 2023-01-13 ENCOUNTER — Other Ambulatory Visit: Payer: Self-pay

## 2023-01-13 ENCOUNTER — Emergency Department
Admission: EM | Admit: 2023-01-13 | Discharge: 2023-01-13 | Disposition: A | Discharge status: Home / Self Care | Payer: MEDICAID | Attending: Emergency Medicine | Admitting: Emergency Medicine

## 2023-01-13 DIAGNOSIS — S82832D Other fracture of upper and lower end of left fibula, subsequent encounter for closed fracture with routine healing: Secondary | ICD-10-CM | POA: Diagnosis not present

## 2023-01-13 DIAGNOSIS — M25572 Pain in left ankle and joints of left foot: Secondary | ICD-10-CM | POA: Diagnosis present

## 2023-01-13 DIAGNOSIS — W19XXXD Unspecified fall, subsequent encounter: Secondary | ICD-10-CM | POA: Diagnosis not present

## 2023-01-13 MED ORDER — NAPROXEN 500 MG PO TABS: 500.0000 mg | ORAL_TABLET | Freq: Two times a day (BID) | ORAL | 0 refills | Status: AC

## 2023-01-13 NOTE — ED Provider Notes (Signed)
[facility] Emergency Department Provider Note     Event Date/Time   First MD Initiated Contact with Patient 01/13/23 2102     (approximate)   History   Ankle Pain   HPI  Kelsey Ho is a 32 y.o. female with a history of chronic pain, anxiety, fibromyalgia, and illicit use disorder, presents to the ED for evaluation continued left ankle pain.  Patient was evaluated here for initial fracture care of a mechanical fall resulting in a nondisplaced distal fibula fracture.  Placed in a ankle splint, and referred to the pedis.  Patient presents today with no interim evaluation for Ortho, noting she has been taking the prescribed Percocet as prescribed.  She denies any reinjury since the incident.  Physical Exam   Triage Vital Signs: ED Triage Vitals  Encounter Vitals Group     BP 01/13/23 2031 133/88     Systolic BP Percentile --      Diastolic BP Percentile --      Pulse Rate 01/13/23 2031 (!) 136     Resp 01/13/23 2031 14     Temp 01/13/23 2031 98.3 F (36.8 C)     Temp src --      SpO2 01/13/23 2031 95 %     Weight 01/13/23 2032 120 lb (54.4 kg)     Height 01/13/23 2032 5\' 1"  (1.549 m)     Head Circumference --      Peak Flow --      Pain Score 01/13/23 2032 8     Pain Loc --      Pain Education --      Exclude from Growth Chart --     Most recent vital signs: Vitals:   01/13/23 2031  BP: 133/88  Pulse: (!) 136  Resp: 14  Temp: 98.3 F (36.8 C)  SpO2: 95%    General Awake, no distress. Somnolent upon exam  HEENT NCAT. PERRL. EOMI. No rhinorrhea. Mucous membranes are moist.  CV:  Good peripheral perfusion. RESP:  Normal effort.  ABD:  No distention.  MSK:  LLE in OCL short-leg splint. Normal toe ROM NEURO: Normal gross sensation   ED Results / Procedures / Treatments   Labs (all labs ordered are listed, but only abnormal results are displayed) Labs Reviewed - No data to display   EKG    RADIOLOGY  No results  found.   PROCEDURES:  Critical Care performed: No  Procedures   MEDICATIONS ORDERED IN ED: Medications - No data to display   IMPRESSION / MDM / ASSESSMENT AND PLAN / ED COURSE  I reviewed the triage vital signs and the nursing notes.                              Differential diagnosis includes, but is not limited to, sprain, ankle fracture subsequent visit  Patient's presentation is most consistent with acute, uncomplicated illness.  Patient's diagnosis is consistent with nondisplaced distal fibula fracture. Patient will be discharged home with prescriptions for cyclobenzaprine and ibuprofen. Patient is to follow up with dietary for further fracture management as discussed, as needed or otherwise directed. Patient is given ED precautions to return to the ED for any worsening or new symptoms.   FINAL CLINICAL IMPRESSION(S) / ED DIAGNOSES   Final diagnoses:  Other closed fracture of distal end of left fibula with routine healing, subsequent encounter     Rx / DC Orders  ED Discharge Orders          Ordered    naproxen (NAPROSYN) 500 MG tablet  2 times daily with meals        01/13/23 2157             Note:  This document was prepared using Dragon voice recognition software and may include unintentional dictation errors.    Lissa Hoard, PA-C 01/13/23 2222    Trinna Post, MD 01/13/23 [phone]

## 2023-01-13 NOTE — ED Notes (Signed)
Pt very sleepy upon d/c to lobby. RN notified first nurse of pt behavior. Pt is a/ox4 when awaken. Pt waiting for ride in lobby.

## 2023-01-13 NOTE — ED Triage Notes (Signed)
Pt states she was seen on Sunday for a fall and was told she fractured her left ankle, pt has ankle splinted. Pt states she has pain to same ankle. Pt has not followed up with ortho. Pt appears drowsy in triage. States she has been taking her prescribed percocet. Good cap refill on effected extremity.

## 2023-01-13 NOTE — Discharge Instructions (Signed)
Use your crutches to ambulate with only toe-touch weightbearing on the left foot.  Rest with the foot elevated and apply ice over the splint to help reduce swelling.  Take the prescription naproxen as prescribed.  Follow-up with podiatry for further fracture management.

## 2023-01-14 ENCOUNTER — Emergency Department
Admission: EM | Admit: 2023-01-14 | Discharge: 2023-01-14 | Disposition: A | Discharge status: Home / Self Care | Payer: MEDICAID | Attending: Emergency Medicine | Admitting: Emergency Medicine

## 2023-01-14 ENCOUNTER — Encounter: Payer: Self-pay | Admitting: Emergency Medicine

## 2023-01-14 DIAGNOSIS — X58XXXD Exposure to other specified factors, subsequent encounter: Secondary | ICD-10-CM | POA: Diagnosis not present

## 2023-01-14 DIAGNOSIS — S82832D Other fracture of upper and lower end of left fibula, subsequent encounter for closed fracture with routine healing: Secondary | ICD-10-CM | POA: Insufficient documentation

## 2023-01-14 DIAGNOSIS — F1110 Opioid abuse, uncomplicated: Secondary | ICD-10-CM | POA: Insufficient documentation

## 2023-01-14 DIAGNOSIS — M25572 Pain in left ankle and joints of left foot: Secondary | ICD-10-CM | POA: Diagnosis present

## 2023-01-14 MED ORDER — OXYCODONE-ACETAMINOPHEN 5-325 MG PO TABS
1.0000 | ORAL_TABLET | Freq: Once | ORAL | Status: AC
  Administered 2023-01-14: 1 via ORAL
  Filled 2023-01-14: qty 1

## 2023-01-14 MED ORDER — OXYCODONE-ACETAMINOPHEN 5-325 MG PO TABS: 1.0000 | ORAL_TABLET | ORAL | 0 refills | Status: DC | PRN

## 2023-01-14 MED ORDER — KETOROLAC TROMETHAMINE 15 MG/ML IJ SOLN
15.0000 mg | Freq: Once | INTRAMUSCULAR | Status: AC
  Administered 2023-01-14: 15 mg via INTRAMUSCULAR
  Filled 2023-01-14: qty 1

## 2023-01-14 NOTE — ED Triage Notes (Signed)
Patient to ED via POV for left leg pain.  States she broke it Sunday and is out of pain medication. No new injury. Took tylenol and ibuprofen with no relief.

## 2023-01-14 NOTE — ED Provider Notes (Addendum)
[facility] Provider Note    Event Date/Time   First MD Initiated Contact with Patient 01/14/23 1115     (approximate)   History   Leg Pain   HPI  Kelsey Ho is a 32 y.o. female with PMH of insomnia, chronic pain, anxiety, opioid abuse, fibromyalgia presents for evaluation of ongoing left ankle pain.  Patient fractured her ankle on 11/17 and was placed in a short leg splint.  She returned to the ED yesterday for the same.       Physical Exam   Triage Vital Signs: ED Triage Vitals [01/14/23 1010]  Encounter Vitals Group     BP 130/87     Systolic BP Percentile      Diastolic BP Percentile      Pulse Rate (!) 128     Resp 18     Temp 98.9 F (37.2 C)     Temp Source Oral     SpO2 98 %     Weight 125 lb (56.7 kg)     Height 5\' 1"  (1.549 m)     Head Circumference      Peak Flow      Pain Score 7     Pain Loc      Pain Education      Exclude from Growth Chart     Most recent vital signs: Vitals:   01/14/23 1010  BP: 130/87  Pulse: (!) 128  Resp: 18  Temp: 98.9 F (37.2 C)  SpO2: 98%   General: Awake, no distress.  CV:  Good peripheral perfusion.  Resp:  Normal effort.  Abd:  No distention.  Other:  Left lower extremity with short leg splint in place, capillary refill of toes appropriate, sensation of toes intact, and range of motion of toes normal.   ED Results / Procedures / Treatments   Labs (all labs ordered are listed, but only abnormal results are displayed) Labs Reviewed - No data to display   PROCEDURES:  Critical Care performed: No  Procedures   MEDICATIONS ORDERED IN ED: Medications - No data to display   IMPRESSION / MDM / ASSESSMENT AND PLAN / ED COURSE  I reviewed the triage vital signs and the nursing notes.                             32 year old female presents for evaluation of ongoing leg pain after a fracture that occurred on 11/17.  Patient is tachycardic in triage otherwise vital signs are  stable.  Patient tearful on exam.  Differential diagnosis includes, but is not limited to, ankle fracture subsequent visit.  Patient's presentation is most consistent with acute, uncomplicated illness.  Given that this is patient's third visit to the ED for the same complaint, I will send her with a few pain pills.  I have confirmed with her that she has an appointment with podiatry scheduled for later this week.  We discussed pain management with OTC medications before taking the Percocet.  She was given one percocet while in the ED. She is aware she cannot drive after taking this medication.  I also encouraged her to keep her foot elevated as swelling is a large component of pain.  She voiced understanding, all questions were answered and she was stable at discharge.     FINAL CLINICAL IMPRESSION(S) / ED DIAGNOSES   Final diagnoses:  Closed fracture of distal end of left fibula with  routine healing, unspecified fracture morphology, subsequent encounter     Rx / DC Orders   ED Discharge Orders          Ordered    oxyCODONE-acetaminophen (PERCOCET) 5-325 MG tablet  Every 4 hours PRN        01/14/23 1152             Note:  This document was prepared using Dragon voice recognition software and may include unintentional dictation errors.   Cameron Ali, PA-C 01/14/23 1155    Cameron Ali, PA-C 01/14/23 1206    Phineas Semen, MD 01/14/23 1249

## 2023-01-14 NOTE — Discharge Instructions (Addendum)
You can take the Percocet every 4-6 hours as needed for severe pain. You can take 650 mg of Tylenol and 600 mg of ibuprofen every 6 hours as needed for mild to moderate pain.  Try to keep your ankle elevated is much as possible as this will improve your swelling which will improve your pain.  Follow up with the orthopedic doctor on Friday.

## 2023-01-20 ENCOUNTER — Emergency Department
Admission: EM | Admit: 2023-01-20 | Discharge: 2023-01-20 | Disposition: A | Discharge status: Home / Self Care | Payer: MEDICAID | Attending: Emergency Medicine | Admitting: Emergency Medicine

## 2023-01-20 ENCOUNTER — Other Ambulatory Visit: Payer: Self-pay

## 2023-01-20 DIAGNOSIS — S82832D Other fracture of upper and lower end of left fibula, subsequent encounter for closed fracture with routine healing: Secondary | ICD-10-CM | POA: Diagnosis not present

## 2023-01-20 DIAGNOSIS — X58XXXD Exposure to other specified factors, subsequent encounter: Secondary | ICD-10-CM | POA: Insufficient documentation

## 2023-01-20 DIAGNOSIS — R000 Tachycardia, unspecified: Secondary | ICD-10-CM | POA: Insufficient documentation

## 2023-01-20 DIAGNOSIS — S99912D Unspecified injury of left ankle, subsequent encounter: Secondary | ICD-10-CM | POA: Diagnosis present

## 2023-01-20 MED ORDER — KETOROLAC TROMETHAMINE 15 MG/ML IJ SOLN
15.0000 mg | Freq: Once | INTRAMUSCULAR | Status: AC
  Administered 2023-01-20: 15 mg via INTRAMUSCULAR
  Filled 2023-01-20: qty 1

## 2023-01-20 MED ORDER — ONDANSETRON 8 MG PO TBDP
8.0000 mg | ORAL_TABLET | Freq: Once | ORAL | Status: AC
  Administered 2023-01-20: 8 mg via ORAL
  Filled 2023-01-20: qty 1

## 2023-01-20 MED ORDER — ONDANSETRON HCL 8 MG PO TABS
8.0000 mg | ORAL_TABLET | Freq: Three times a day (TID) | ORAL | 1 refills | Status: DC | PRN
  Filled 2023-02-10 – 2023-02-23 (×4): qty 90, 30d supply, fill #0

## 2023-01-20 NOTE — ED Triage Notes (Signed)
Pt to ED for left ankle pain, reports broke ankle. Cast currently on ankle. Reports pain meds are not working. Has been seen multiple times since fracture on 11/17 Pt appears to be intoxicated

## 2023-01-20 NOTE — Discharge Instructions (Addendum)
You can take 650 mg of Tylenol and 600 mg of ibuprofen every 6 hours as needed for pain.  Please continue to follow-up with orthopedics.  Make sure that you are nonweightbearing as walking on the cast will delay your healing.

## 2023-01-20 NOTE — ED Provider Notes (Signed)
[facility] Provider Note    Event Date/Time   First MD Initiated Contact with Patient 01/20/23 1654     (approximate)   History   Ankle Pain   HPI  Kelsey Ho is a 32 y.o. female  with PMH of opioid abuse, cocaine use, chronic pain presents for evaluation of the ankle pain.  Patient was diagnosed with a distal left fibular fracture on 11/17.  This is her fourth visit to the ED for ankle pain.  She reports that she is still having ankle pain and has not taken any pain medication at home.  She was prescribed hydrocodone by orthopedics and says that these "do not agree with her".  She is asking for Percocet or IV pain medication today.   Physical Exam   Triage Vital Signs: ED Triage Vitals  Encounter Vitals Group     BP 01/20/23 1502 (!) 145/103     Systolic BP Percentile --      Diastolic BP Percentile --      Pulse Rate 01/20/23 1502 (!) 119     Resp 01/20/23 1502 16     Temp 01/20/23 1502 98.6 F (37 C)     Temp src --      SpO2 01/20/23 1502 97 %     Weight 01/20/23 1501 123 lb 7.3 oz (56 kg)     Height 01/20/23 1501 5\' 1"  (1.549 m)     Head Circumference --      Peak Flow --      Pain Score 01/20/23 1500 9     Pain Loc --      Pain Education --      Exclude from Growth Chart --     Most recent vital signs: Vitals:   01/20/23 1502  BP: (!) 145/103  Pulse: (!) 119  Resp: 16  Temp: 98.6 F (37 C)  SpO2: 97%    General: Awake, no distress.  Slurring words. CV:  Good peripheral perfusion. Tachycardic. Resp:  Normal effort.  Abd:  No distention.  Other:  Left leg has short leg cast in place.  Bilateral pupils are dilated.   ED Results / Procedures / Treatments   Labs (all labs ordered are listed, but only abnormal results are displayed) Labs Reviewed - No data to display   PROCEDURES:  Critical Care performed: No  Procedures   MEDICATIONS ORDERED IN ED: Medications  ketorolac (TORADOL) 15 MG/ML injection 15 mg (15 mg  Intramuscular Given 01/20/23 1737)  ondansetron (ZOFRAN-ODT) disintegrating tablet 8 mg (8 mg Oral Given 01/20/23 1738)     IMPRESSION / MDM / ASSESSMENT AND PLAN / ED COURSE  I reviewed the triage vital signs and the nursing notes.                             32 year old female presents for evaluation of left ankle pain.  Patient was hypertensive and tachycardic in triage, vital signs stable otherwise.  Patient NAD on exam.  Differential diagnosis includes, but is not limited to, chronic pain, ankle fracture.   Patient's presentation is most consistent with acute, uncomplicated illness.  This is patient's fourth visit to the ED for left ankle pain.  I reviewed patient's chart and the visit from 11/22 with orthopedics where the provider was concerned that she was under the influence as she was slurring her words and very confused.  Today she also appears to be under the  influence as her pupils are very dilated and she is slurring her words.  She does have a history of opioid abuse and cocaine use.  I do not feel comfortable giving her narcotic pain medication while in the emergency department and I explained that I would not be sending her with another prescription for pain meds.  She was given Toradol and Zofran while in the ED today.  I will refill her prescription for nausea medication.  She was advised to use over-the-counter medications like Tylenol and ibuprofen.  I also reiterated the importance of being nonweightbearing and elevating her foot.  She voiced understanding, all questions were answered and she was stable at discharge.      FINAL CLINICAL IMPRESSION(S) / ED DIAGNOSES   Final diagnoses:  Closed fracture of distal end of left fibula with routine healing, unspecified fracture morphology, subsequent encounter     Rx / DC Orders   ED Discharge Orders          Ordered    ondansetron (ZOFRAN) 8 MG tablet  Every 8 hours PRN        01/20/23 1741             Note:   This document was prepared using Dragon voice recognition software and may include unintentional dictation errors.   Cameron Ali, PA-C 01/20/23 1743    Minna Antis, MD 01/20/23 2228

## 2023-01-20 NOTE — ED Notes (Signed)
See triage notes. Patient c/o lower right back pain, left ankle pain and emesis.

## 2023-02-02 ENCOUNTER — Other Ambulatory Visit (HOSPITAL_COMMUNITY): Payer: Self-pay

## 2023-02-02 ENCOUNTER — Other Ambulatory Visit: Payer: Self-pay

## 2023-02-02 ENCOUNTER — Other Ambulatory Visit: Payer: Self-pay | Admitting: Family

## 2023-02-03 ENCOUNTER — Other Ambulatory Visit: Payer: Self-pay

## 2023-02-03 ENCOUNTER — Other Ambulatory Visit: Payer: Self-pay | Admitting: Family

## 2023-02-03 ENCOUNTER — Other Ambulatory Visit (HOSPITAL_COMMUNITY): Payer: Self-pay

## 2023-02-04 ENCOUNTER — Other Ambulatory Visit: Payer: Self-pay | Admitting: Family

## 2023-02-04 ENCOUNTER — Other Ambulatory Visit (HOSPITAL_COMMUNITY): Payer: Self-pay

## 2023-02-04 ENCOUNTER — Other Ambulatory Visit: Payer: Self-pay

## 2023-02-04 NOTE — Telephone Encounter (Signed)
Last office visit: 01/19/22 Next office visit: nothing scheduled Last refill:  zolpidem (AMBIEN CR) 12.5 MG CR tablet 12/15/22 pregabalin (LYRICA) 50 MG capsule 11/06/21 LORazepam (ATIVAN) 1 MG tablet 12/19/21

## 2023-02-05 ENCOUNTER — Encounter (HOSPITAL_COMMUNITY): Payer: Self-pay | Admitting: Pharmacist

## 2023-02-05 ENCOUNTER — Other Ambulatory Visit: Payer: Self-pay

## 2023-02-05 ENCOUNTER — Other Ambulatory Visit (HOSPITAL_COMMUNITY): Payer: Self-pay

## 2023-02-05 MED ORDER — TIZANIDINE HCL 4 MG PO TABS
4.0000 mg | ORAL_TABLET | Freq: Four times a day (QID) | ORAL | 0 refills | Status: DC | PRN
  Filled 2023-02-10 – 2023-02-12 (×2): qty 60, 15d supply, fill #0

## 2023-02-06 ENCOUNTER — Other Ambulatory Visit: Payer: Self-pay

## 2023-02-06 ENCOUNTER — Other Ambulatory Visit (HOSPITAL_COMMUNITY): Payer: Self-pay

## 2023-02-07 ENCOUNTER — Other Ambulatory Visit: Payer: Self-pay | Admitting: Family

## 2023-02-09 ENCOUNTER — Telehealth: Payer: Self-pay | Admitting: Family

## 2023-02-09 ENCOUNTER — Encounter (HOSPITAL_COMMUNITY): Payer: Self-pay

## 2023-02-09 ENCOUNTER — Other Ambulatory Visit (HOSPITAL_COMMUNITY): Payer: Self-pay

## 2023-02-09 ENCOUNTER — Ambulatory Visit: Payer: MEDICAID | Admitting: Family

## 2023-02-09 NOTE — Telephone Encounter (Signed)
Pt arrived late to appointment, over ten minute threshold.  I was able to visually see her walk in to the office at time checked in.   Pt has been advised multiple times prior that she needs to show up on time for appts. Front desk asked if we could still see patient, and I advised them that pt would need to reschedule. Edmonia James our office manager came to me to discuss well stating that pt was advised to reschedule however they refused, and they stated that they would wait. Vernona Rieger advised me that they would sit there up to a few hours to see if any cancellations, my recommendation was again to have them reschedule so that they would not be in the waiting room with the expectation of being seen when they were already advised to reschedule (my next pt was already checked in and being roomed as well).

## 2023-02-10 ENCOUNTER — Other Ambulatory Visit: Payer: Self-pay | Admitting: Family

## 2023-02-10 ENCOUNTER — Other Ambulatory Visit (HOSPITAL_COMMUNITY): Payer: Self-pay

## 2023-02-10 ENCOUNTER — Ambulatory Visit: Payer: MEDICAID | Admitting: Family

## 2023-02-10 ENCOUNTER — Other Ambulatory Visit: Payer: Self-pay

## 2023-02-10 MED ORDER — CYANOCOBALAMIN 1000 MCG/ML IJ SOLN
1000.0000 ug | INTRAMUSCULAR | 0 refills | Status: DC
  Filled 2023-02-10 – 2023-02-25 (×11): qty 1, 30d supply, fill #0

## 2023-02-10 MED ORDER — TIZANIDINE HCL 4 MG PO TABS
4.0000 mg | ORAL_TABLET | Freq: Four times a day (QID) | ORAL | 0 refills | Status: DC | PRN
  Filled 2023-02-10 – 2023-02-20 (×7): qty 120, 30d supply, fill #0

## 2023-02-12 ENCOUNTER — Other Ambulatory Visit: Payer: Self-pay | Admitting: Family

## 2023-02-12 ENCOUNTER — Other Ambulatory Visit (HOSPITAL_COMMUNITY): Payer: Self-pay

## 2023-02-12 ENCOUNTER — Other Ambulatory Visit: Payer: Self-pay

## 2023-02-12 ENCOUNTER — Ambulatory Visit (INDEPENDENT_AMBULATORY_CARE_PROVIDER_SITE_OTHER): Payer: MEDICAID | Admitting: Family

## 2023-02-12 VITALS — BP 128/78 | HR 114 | Temp 99.9°F | Ht 61.0 in | Wt 137.0 lb

## 2023-02-12 DIAGNOSIS — Z30011 Encounter for initial prescription of contraceptive pills: Secondary | ICD-10-CM

## 2023-02-12 DIAGNOSIS — R350 Frequency of micturition: Secondary | ICD-10-CM

## 2023-02-12 DIAGNOSIS — R000 Tachycardia, unspecified: Secondary | ICD-10-CM

## 2023-02-12 DIAGNOSIS — R739 Hyperglycemia, unspecified: Secondary | ICD-10-CM | POA: Insufficient documentation

## 2023-02-12 DIAGNOSIS — R112 Nausea with vomiting, unspecified: Secondary | ICD-10-CM | POA: Insufficient documentation

## 2023-02-12 DIAGNOSIS — D582 Other hemoglobinopathies: Secondary | ICD-10-CM | POA: Diagnosis not present

## 2023-02-12 DIAGNOSIS — E538 Deficiency of other specified B group vitamins: Secondary | ICD-10-CM | POA: Diagnosis not present

## 2023-02-12 DIAGNOSIS — R002 Palpitations: Secondary | ICD-10-CM

## 2023-02-12 DIAGNOSIS — R9431 Abnormal electrocardiogram [ECG] [EKG]: Secondary | ICD-10-CM

## 2023-02-12 DIAGNOSIS — E876 Hypokalemia: Secondary | ICD-10-CM | POA: Insufficient documentation

## 2023-02-12 DIAGNOSIS — Z6825 Body mass index (BMI) 25.0-25.9, adult: Secondary | ICD-10-CM | POA: Diagnosis not present

## 2023-02-12 DIAGNOSIS — Z79899 Other long term (current) drug therapy: Secondary | ICD-10-CM

## 2023-02-12 DIAGNOSIS — G479 Sleep disorder, unspecified: Secondary | ICD-10-CM

## 2023-02-12 DIAGNOSIS — F1494 Cocaine use, unspecified with cocaine-induced mood disorder: Secondary | ICD-10-CM

## 2023-02-12 LAB — B12 AND FOLATE PANEL
Folate: 6.1 ng/mL (ref >5.9)
Vitamin B-12: 1129 pg/mL — ABNORMAL HIGH (ref 211–911)

## 2023-02-12 LAB — CBC
HCT: 45 % (ref 36.0–46.0)
Hemoglobin: 15.2 g/dL — ABNORMAL HIGH (ref 12.0–15.0)
MCHC: 33.7 g/dL (ref 30.0–36.0)
MCV: 88.5 fL (ref 78.0–100.0)
Platelets: 351 10*3/uL (ref 150.0–400.0)
RBC: 5.09 Mil/uL (ref 3.87–5.11)
RDW: 13.7 % (ref 11.5–15.5)
WBC: 7.2 10*3/uL (ref 4.0–10.5)

## 2023-02-12 LAB — COMPREHENSIVE METABOLIC PANEL
ALT: 13 U/L (ref 0–35)
AST: 18 U/L (ref 0–37)
Albumin: 5.1 g/dL (ref 3.5–5.2)
Alkaline Phosphatase: 86 U/L (ref 39–117)
BUN: 6 mg/dL (ref 6–23)
CO2: 30 meq/L (ref 19–32)
Calcium: 10 mg/dL (ref 8.4–10.5)
Chloride: 102 meq/L (ref 96–112)
Creatinine, Ser: 0.98 mg/dL (ref 0.40–1.20)
GFR: 76.48 mL/min (ref >60.00)
Glucose, Bld: 90 mg/dL (ref 70–99)
Potassium: 3.9 meq/L (ref 3.5–5.1)
Sodium: 143 meq/L (ref 135–145)
Total Bilirubin: 0.5 mg/dL (ref 0.2–1.2)
Total Protein: 7.7 g/dL (ref 6.0–8.3)

## 2023-02-12 LAB — HEMOGLOBIN A1C: Hgb A1c MFr Bld: 5.5 % (ref 4.6–6.5)

## 2023-02-12 LAB — HCG, QUANTITATIVE, PREGNANCY: Quantitative HCG: <0.6 m[IU]/mL

## 2023-02-12 MED ORDER — TIRZEPATIDE 2.5 MG/0.5ML ~~LOC~~ SOAJ: 2.5000 mg | SUBCUTANEOUS | Status: DC

## 2023-02-12 NOTE — Patient Instructions (Signed)
  Check out [facility].    Regards,   Mort Sawyers FNP-C

## 2023-02-12 NOTE — Progress Notes (Signed)
Established Patient Office Visit  Subjective:      CC:  Chief Complaint  Patient presents with   Anxiety    F/u   Ankle Pain    Pt broke ankle and is having a hard time managing pain   Insomnia    Pt would like to disucss issues sleeping    HPI: Kelsey Ho is a 32 y.o. female presenting on 02/12/2023 for Anxiety (F/u), Ankle Pain (Pt broke ankle and is having a hard time managing pain), and Insomnia (Pt would like to disucss issues sleeping) . Insomnia: pt has tried multiple medications as well as multiple visits with psychiatry (different offices) to try to treat however pt does not note any improvement with any regimens that she has tried.   Anxiety: not well controlled, asking for refill on xanax as she states 'lorazepam does not work as well for her anxiety'.   Wt Readings from Last 3 Encounters:  02/12/23 137 lb (62.1 kg)  01/20/23 123 lb 7.3 oz (56 kg)  01/14/23 125 lb (56.7 kg)   Temp Readings from Last 3 Encounters:  02/12/23 99.9 F (37.7 C) (Temporal)  01/20/23 98.6 F (37 C)  01/14/23 98.9 F (37.2 C) (Oral)   BP Readings from Last 3 Encounters:  02/12/23 128/78  01/20/23 (!) 145/103  01/14/23 130/87   Pulse Readings from Last 3 Encounters:  02/12/23 (!) 114  01/20/23 (!) 119  01/14/23 (!) 128   Elevated heart rate, ongoing. Denies chest pain and or palpitations. She does state at times feels as though 'I can't breathe'   Recent left ankle fracture, followed by orthopedist. In brace at current.   New complaints: This am right lower quadrant abdomen started hurting.   She ran out of her birth control a few weeks ago because she packed it while moving and hasn't been on it. since She states she also has not been sexually active. No change in bowel movements. She isn't eating much but this is also not abn for her.      Social history:  Relevant past medical, surgical, family and social history reviewed and updated as indicated. Interim  medical history since our last visit reviewed.  Allergies and medications reviewed and updated.  DATA REVIEWED: CHART IN EPIC     ROS: Negative unless specifically indicated above in HPI.    Current Outpatient Medications:    cyanocobalamin (VITAMIN B12) 1000 MCG/ML injection, Inject 1 mL (1,000 mcg total) into the skin once per month (30 days), Disp: 1 mL, Rfl: 0   cyclobenzaprine (FLEXERIL) 10 MG tablet, Take 1 tablet (10 mg total) by mouth daily., Disp: 30 tablet, Rfl: 1   LORazepam (ATIVAN) 1 MG tablet, Take 1 mg by mouth 3 (three) times daily., Disp: , Rfl:    ondansetron (ZOFRAN) 8 MG tablet, Take 1 tablet (8 mg total) by mouth every 8 (eight) hours as needed., Disp: 90 tablet, Rfl: 1   tirzepatide (MOUNJARO) 2.5 MG/0.5ML Pen, Inject 2.5 mg into the skin once a week., Disp: , Rfl:    tiZANidine (ZANAFLEX) 4 MG tablet, Take 1 tablet (4 mg total) by mouth every 6 (six) hours as needed., Disp: 120 tablet, Rfl: 0   zolpidem (AMBIEN) 10 MG tablet, Take 1 tablet (10 mg total) by mouth at bedtime as needed., Disp: 30 tablet, Rfl: 1   gabapentin (NEURONTIN) 300 MG capsule, Take 1 capsule (300 mg total) by mouth every 8 (eight) hours. (Patient not taking: Reported on 02/12/2023), Disp: 90  capsule, Rfl: 1   LO LOESTRIN FE 1 MG-10 MCG / 10 MCG tablet, Take 1 tablet by mouth daily. (Patient not taking: Reported on 02/12/2023), Disp: 28 tablet, Rfl: 0  Current Facility-Administered Medications:    cyanocobalamin (VITAMIN B12) injection 1,000 mcg, 1,000 mcg, Intramuscular, Once, Yolanda Dockendorf, FNP      Objective:    BP 128/78   Pulse (!) 114   Temp 99.9 F (37.7 C) (Temporal)   Ht 5\' 1"  (1.549 m)   Wt 137 lb (62.1 kg)   SpO2 98%   BMI 25.89 kg/m   Wt Readings from Last 3 Encounters:  02/12/23 137 lb (62.1 kg)  01/20/23 123 lb 7.3 oz (56 kg)  01/14/23 125 lb (56.7 kg)    Physical Exam Constitutional:      General: She is not in acute distress.    Appearance: Normal appearance.  She is normal weight. She is not ill-appearing, toxic-appearing or diaphoretic.  HENT:     Head: Normocephalic.  Cardiovascular:     Rate and Rhythm: Normal rate.  Pulmonary:     Effort: Pulmonary effort is normal.  Abdominal:     General: Bowel sounds are normal.     Palpations: Abdomen is soft.     Tenderness: There is abdominal tenderness in the right lower quadrant. There is no rebound. Negative signs include Murphy's sign, Rovsing's sign, McBurney's sign and psoas sign.  Musculoskeletal:        General: Normal range of motion.  Neurological:     General: No focal deficit present.     Mental Status: She is alert and oriented to person, place, and time. Mental status is at baseline.  Psychiatric:        Mood and Affect: Mood normal.        Behavior: Behavior normal.        Thought Content: Thought content normal.        Judgment: Judgment normal.           Assessment & Plan:  B12 deficiency -     B12 and Folate Panel -     CBC  Hyperglycemia -     Hemoglobin A1c -     Comprehensive metabolic panel  BMI 25.0-25.9,adult Assessment & Plan: Counseled on use of tirzepatide, I do not feel this is appropriate for her BMI however she is getting this from outside source. Did educate her on taking medications that are not prescribed for her. I do feel this could likely be contributing to her nausea/vomiting.    Orders: -     Tirzepatide; Inject 2.5 mg into the skin once a week.  Low blood potassium  Elevated hemoglobin (HCC)  Nausea and vomiting, unspecified vomiting type  Abnormal EKG -     Ambulatory referral to Cardiology -     LONG TERM MONITOR (3-14 DAYS); Future  Tachycardia Assessment & Plan: Ongoing chronically  Tachycardia EKG  Referral placed for cardiology, pt has had h/o cocaine abuse which may have contributed  Zio monitor ordered and pending.  Discussed red flag symptoms, currently without.  Avoid caffeine    Orders: -     Ambulatory referral  to Cardiology -     LONG TERM MONITOR (3-14 DAYS); Future  Encounter for initial prescription of contraceptive pills -     hCG, quantitative, pregnancy  Urinary frequency Assessment & Plan: Hcg test negative, refilled ocps (pt not smoking per her verbally) Urinalysis with reflex culture ordered pending results  R/o UTI  with rlq abd tenderness.    Orders: -     Urinalysis w microscopic + reflex cultur; Future  Palpitations -     Ambulatory referral to Cardiology -     LONG TERM MONITOR (3-14 DAYS); Future  Difficulty sleeping Assessment & Plan: Pt has seen multiple psychiatrists and tried multiple medications, however still not improving.  Pt states she has an appt upcoming with Dr. Evelene Croon, psychiatry for evaulation.     High risk medication use Assessment & Plan: Counseled in detail on polypharmacy and not mixing meds with other meds that could have potentially fatal effects. Pt is aware.     Cocaine use with cocaine-induced mood disorder [facility]) Assessment & Plan: Pt verbally states she is no longer using, not confirmed with UDS      Return in about 3 months (around 05/13/2023) for follow up lab results .  Mort Sawyers, MSN, APRN, FNP-C [facility]

## 2023-02-17 ENCOUNTER — Other Ambulatory Visit: Payer: Self-pay | Admitting: Family

## 2023-02-17 ENCOUNTER — Other Ambulatory Visit: Payer: Self-pay

## 2023-02-17 ENCOUNTER — Other Ambulatory Visit (HOSPITAL_COMMUNITY): Payer: Self-pay

## 2023-02-17 DIAGNOSIS — Z6825 Body mass index (BMI) 25.0-25.9, adult: Secondary | ICD-10-CM

## 2023-02-18 ENCOUNTER — Encounter: Payer: Self-pay | Admitting: Family

## 2023-02-18 DIAGNOSIS — R350 Frequency of micturition: Secondary | ICD-10-CM | POA: Insufficient documentation

## 2023-02-18 DIAGNOSIS — Z3041 Encounter for surveillance of contraceptive pills: Secondary | ICD-10-CM

## 2023-02-18 NOTE — Assessment & Plan Note (Signed)
Counseled on use of tirzepatide, I do not feel this is appropriate for her BMI however she is getting this from outside source. Did educate her on taking medications that are not prescribed for her. I do feel this could likely be contributing to her nausea/vomiting.

## 2023-02-18 NOTE — Assessment & Plan Note (Signed)
Counseled in detail on polypharmacy and not mixing meds with other meds that could have potentially fatal effects. Pt is aware.

## 2023-02-18 NOTE — Assessment & Plan Note (Signed)
Hcg test negative, refilled ocps (pt not smoking per her verbally) Urinalysis with reflex culture ordered pending results  R/o UTI with rlq abd tenderness.

## 2023-02-18 NOTE — Assessment & Plan Note (Signed)
Pt verbally states she is no longer using, not confirmed with UDS

## 2023-02-18 NOTE — Assessment & Plan Note (Signed)
Pt has seen multiple psychiatrists and tried multiple medications, however still not improving.  Pt states she has an appt upcoming with Dr. Evelene Croon, psychiatry for evaulation.

## 2023-02-18 NOTE — Assessment & Plan Note (Signed)
Ongoing chronically  Tachycardia EKG  Referral placed for cardiology, pt has had h/o cocaine abuse which may have contributed  Zio monitor ordered and pending.  Discussed red flag symptoms, currently without.  Avoid caffeine

## 2023-02-19 ENCOUNTER — Other Ambulatory Visit: Payer: Self-pay

## 2023-02-19 ENCOUNTER — Other Ambulatory Visit (HOSPITAL_COMMUNITY): Payer: Self-pay

## 2023-02-19 MED ORDER — LO LOESTRIN FE 1 MG-10 MCG / 10 MCG PO TABS: 1.0000 | ORAL_TABLET | Freq: Every day | ORAL | 11 refills | Status: DC

## 2023-02-19 NOTE — Addendum Note (Signed)
Addended by: Mort Sawyers on: 02/19/2023 05:25 PM   Modules accepted: Orders

## 2023-02-20 ENCOUNTER — Other Ambulatory Visit: Payer: Self-pay

## 2023-02-20 ENCOUNTER — Other Ambulatory Visit (HOSPITAL_COMMUNITY): Payer: Self-pay

## 2023-02-21 ENCOUNTER — Other Ambulatory Visit (HOSPITAL_COMMUNITY): Payer: Self-pay

## 2023-02-22 MED ORDER — LO LOESTRIN FE 1 MG-10 MCG / 10 MCG PO TABS
1.0000 | ORAL_TABLET | Freq: Every day | ORAL | 11 refills | Status: DC
  Filled 2023-02-24: qty 28, 28d supply, fill #0
  Filled 2023-03-19: qty 28, 28d supply, fill #1
  Filled 2023-04-16: qty 28, 28d supply, fill #2
  Filled 2023-05-14: qty 28, 28d supply, fill #3
  Filled 2023-06-08: qty 28, 28d supply, fill #4
  Filled 2023-07-13: qty 28, 28d supply, fill #5
  Filled 2023-08-13: qty 28, 28d supply, fill #6
  Filled 2023-09-06: qty 28, 28d supply, fill #7

## 2023-02-22 NOTE — Addendum Note (Signed)
Addended by: Mort Sawyers on: 02/22/2023 08:58 PM   Modules accepted: Orders

## 2023-02-23 ENCOUNTER — Other Ambulatory Visit (HOSPITAL_BASED_OUTPATIENT_CLINIC_OR_DEPARTMENT_OTHER): Payer: Self-pay

## 2023-02-23 ENCOUNTER — Other Ambulatory Visit (HOSPITAL_COMMUNITY): Payer: Self-pay

## 2023-02-23 ENCOUNTER — Encounter: Payer: Self-pay | Admitting: Family

## 2023-02-23 ENCOUNTER — Other Ambulatory Visit: Payer: Self-pay | Admitting: Family

## 2023-02-23 ENCOUNTER — Other Ambulatory Visit: Payer: Self-pay

## 2023-02-24 ENCOUNTER — Other Ambulatory Visit (HOSPITAL_COMMUNITY): Payer: Self-pay

## 2023-02-24 ENCOUNTER — Other Ambulatory Visit: Payer: Self-pay

## 2023-02-24 NOTE — Telephone Encounter (Signed)
Can we do this for her? She might also be asking to have her off mychart as well as emergency contact and point of contact. Just clarify.

## 2023-02-24 NOTE — Telephone Encounter (Signed)
I ordered a heart monitor to go to patients home, however she states she has not yet received this. Is this something you guys are able to track down?

## 2023-02-26 ENCOUNTER — Other Ambulatory Visit (HOSPITAL_COMMUNITY): Payer: Self-pay

## 2023-02-26 ENCOUNTER — Other Ambulatory Visit (HOSPITAL_BASED_OUTPATIENT_CLINIC_OR_DEPARTMENT_OTHER): Payer: Self-pay

## 2023-02-26 ENCOUNTER — Other Ambulatory Visit: Payer: Self-pay

## 2023-02-26 ENCOUNTER — Other Ambulatory Visit: Payer: Self-pay | Admitting: Family

## 2023-03-09 ENCOUNTER — Other Ambulatory Visit (HOSPITAL_COMMUNITY): Payer: Self-pay

## 2023-03-09 ENCOUNTER — Other Ambulatory Visit: Payer: Self-pay

## 2023-03-09 ENCOUNTER — Other Ambulatory Visit (HOSPITAL_BASED_OUTPATIENT_CLINIC_OR_DEPARTMENT_OTHER): Payer: Self-pay

## 2023-03-09 MED ORDER — XTAMPZA ER 9 MG PO C12A
9.0000 mg | EXTENDED_RELEASE_CAPSULE | Freq: Every day | ORAL | 0 refills | Status: DC
  Filled 2023-03-09: qty 30, 30d supply, fill #0

## 2023-03-09 MED ORDER — CYANOCOBALAMIN 1000 MCG/ML IJ SOLN
1000.0000 ug | INTRAMUSCULAR | 0 refills | Status: AC
  Filled 2023-03-09 – 2023-03-25 (×2): qty 1, 1d supply, fill #0

## 2023-03-09 MED ORDER — ONDANSETRON HCL 8 MG PO TABS
ORAL_TABLET | ORAL | 2 refills | Status: AC
  Filled 2023-03-09: qty 90, 30d supply, fill #0

## 2023-03-09 MED ORDER — ZOLPIDEM TARTRATE ER 12.5 MG PO TBCR
12.5000 mg | EXTENDED_RELEASE_TABLET | Freq: Every evening | ORAL | 0 refills | Status: AC | PRN
  Filled 2023-03-09: qty 30, 30d supply, fill #0

## 2023-03-09 MED ORDER — METHOCARBAMOL 750 MG PO TABS
750.0000 mg | ORAL_TABLET | Freq: Three times a day (TID) | ORAL | 0 refills | Status: DC
  Filled 2023-03-09: qty 90, 30d supply, fill #0

## 2023-03-09 MED ORDER — TIZANIDINE HCL 4 MG PO TABS
4.0000 mg | ORAL_TABLET | Freq: Two times a day (BID) | ORAL | 2 refills | Status: AC | PRN
  Filled 2023-03-09: qty 38, 19d supply, fill #0
  Filled 2023-03-09 – 2023-03-12 (×4): qty 60, 30d supply, fill #0
  Filled 2023-04-05 – 2023-11-18 (×13): qty 60, 30d supply, fill #1

## 2023-03-09 MED ORDER — TRINTELLIX 10 MG PO TABS
10.0000 mg | ORAL_TABLET | Freq: Every day | ORAL | 0 refills | Status: DC
  Filled 2023-03-09: qty 30, 30d supply, fill #0

## 2023-03-10 ENCOUNTER — Encounter (HOSPITAL_COMMUNITY): Payer: Self-pay

## 2023-03-10 ENCOUNTER — Other Ambulatory Visit (HOSPITAL_COMMUNITY): Payer: Self-pay

## 2023-03-11 ENCOUNTER — Other Ambulatory Visit: Payer: Self-pay | Admitting: Family

## 2023-03-11 ENCOUNTER — Other Ambulatory Visit (HOSPITAL_COMMUNITY): Payer: Self-pay

## 2023-03-11 ENCOUNTER — Encounter: Payer: Self-pay | Admitting: Internal Medicine

## 2023-03-12 ENCOUNTER — Encounter (HOSPITAL_COMMUNITY): Payer: Self-pay

## 2023-03-12 ENCOUNTER — Other Ambulatory Visit (HOSPITAL_COMMUNITY): Payer: Self-pay

## 2023-03-13 ENCOUNTER — Other Ambulatory Visit (HOSPITAL_COMMUNITY): Payer: Self-pay

## 2023-03-13 ENCOUNTER — Encounter: Payer: Self-pay | Admitting: Pharmacist

## 2023-03-13 ENCOUNTER — Other Ambulatory Visit: Payer: Self-pay

## 2023-03-19 ENCOUNTER — Other Ambulatory Visit (HOSPITAL_COMMUNITY): Payer: Self-pay

## 2023-03-19 ENCOUNTER — Other Ambulatory Visit: Payer: Self-pay

## 2023-03-20 ENCOUNTER — Other Ambulatory Visit (HOSPITAL_COMMUNITY): Payer: Self-pay

## 2023-03-25 ENCOUNTER — Other Ambulatory Visit (HOSPITAL_COMMUNITY): Payer: Self-pay

## 2023-03-25 ENCOUNTER — Other Ambulatory Visit: Payer: Self-pay

## 2023-03-26 ENCOUNTER — Other Ambulatory Visit: Payer: Self-pay

## 2023-04-05 ENCOUNTER — Other Ambulatory Visit (HOSPITAL_COMMUNITY): Payer: Self-pay

## 2023-04-06 ENCOUNTER — Other Ambulatory Visit: Payer: Self-pay

## 2023-04-06 ENCOUNTER — Other Ambulatory Visit (HOSPITAL_COMMUNITY): Payer: Self-pay

## 2023-04-16 ENCOUNTER — Encounter: Payer: Self-pay | Admitting: Pharmacist

## 2023-04-16 ENCOUNTER — Other Ambulatory Visit (HOSPITAL_COMMUNITY): Payer: Self-pay

## 2023-04-16 ENCOUNTER — Other Ambulatory Visit: Payer: Self-pay

## 2023-04-17 ENCOUNTER — Other Ambulatory Visit (HOSPITAL_COMMUNITY): Payer: Self-pay

## 2023-04-19 ENCOUNTER — Other Ambulatory Visit (HOSPITAL_COMMUNITY): Payer: Self-pay

## 2023-04-24 ENCOUNTER — Emergency Department
Admission: EM | Admit: 2023-04-24 | Discharge: 2023-04-24 | Disposition: A | Discharge status: Home / Self Care | Payer: MEDICAID | Attending: Emergency Medicine | Admitting: Emergency Medicine

## 2023-04-24 ENCOUNTER — Other Ambulatory Visit: Payer: Self-pay

## 2023-04-24 ENCOUNTER — Emergency Department: Payer: MEDICAID

## 2023-04-24 DIAGNOSIS — R1033 Periumbilical pain: Secondary | ICD-10-CM

## 2023-04-24 DIAGNOSIS — K5900 Constipation, unspecified: Secondary | ICD-10-CM | POA: Diagnosis not present

## 2023-04-24 LAB — COMPREHENSIVE METABOLIC PANEL
ALT: 15 U/L (ref 0–44)
AST: 17 U/L (ref 15–41)
Albumin: 4.5 g/dL (ref 3.5–5.0)
Alkaline Phosphatase: 64 U/L (ref 38–126)
Anion gap: 10 (ref 5–15)
BUN: 15 mg/dL (ref 6–20)
CO2: 27 mmol/L (ref 22–32)
Calcium: 10 mg/dL (ref 8.9–10.3)
Chloride: 102 mmol/L (ref 98–111)
Creatinine, Ser: 0.89 mg/dL (ref 0.44–1.00)
GFR, Estimated: >60 mL/min (ref >60)
Glucose, Bld: 102 mg/dL — ABNORMAL HIGH (ref 70–99)
Potassium: 4.2 mmol/L (ref 3.5–5.1)
Sodium: 139 mmol/L (ref 135–145)
Total Bilirubin: 0.8 mg/dL (ref 0.0–1.2)
Total Protein: 7.5 g/dL (ref 6.5–8.1)

## 2023-04-24 LAB — CBC
HCT: 41.2 % (ref 36.0–46.0)
Hemoglobin: 13.6 g/dL (ref 12.0–15.0)
MCH: 29.8 pg (ref 26.0–34.0)
MCHC: 33 g/dL (ref 30.0–36.0)
MCV: 90.4 fL (ref 80.0–100.0)
Platelets: 320 10*3/uL (ref 150–400)
RBC: 4.56 MIL/uL (ref 3.87–5.11)
RDW: 12.6 % (ref 11.5–15.5)
WBC: 7.4 10*3/uL (ref 4.0–10.5)
nRBC: 0 % (ref 0.0–0.2)

## 2023-04-24 LAB — URINALYSIS, ROUTINE W REFLEX MICROSCOPIC
Bilirubin Urine: NEGATIVE
Glucose, UA: NEGATIVE mg/dL
Hgb urine dipstick: NEGATIVE
Ketones, ur: NEGATIVE mg/dL
Leukocytes,Ua: NEGATIVE
Nitrite: NEGATIVE
Protein, ur: NEGATIVE mg/dL
Specific Gravity, Urine: 1.005 (ref 1.005–1.030)
pH: 7 (ref 5.0–8.0)

## 2023-04-24 LAB — POC URINE PREG, ED: Preg Test, Ur: NEGATIVE

## 2023-04-24 LAB — LIPASE, BLOOD: Lipase: 27 U/L (ref 11–51)

## 2023-04-24 MED ORDER — MORPHINE SULFATE (PF) 4 MG/ML IV SOLN
4.0000 mg | Freq: Once | INTRAVENOUS | Status: AC
  Administered 2023-04-24: 4 mg via INTRAVENOUS
  Filled 2023-04-24: qty 1

## 2023-04-24 MED ORDER — MORPHINE SULFATE (PF) 2 MG/ML IV SOLN
2.0000 mg | Freq: Once | INTRAVENOUS | Status: AC
  Administered 2023-04-24: 2 mg via INTRAVENOUS
  Filled 2023-04-24: qty 1

## 2023-04-24 MED ORDER — ONDANSETRON HCL 4 MG/2ML IJ SOLN
4.0000 mg | Freq: Once | INTRAMUSCULAR | Status: AC
  Administered 2023-04-24: 4 mg via INTRAVENOUS
  Filled 2023-04-24: qty 2

## 2023-04-24 MED ORDER — LACTATED RINGERS IV BOLUS
1000.0000 mL | Freq: Once | INTRAVENOUS | Status: AC
  Administered 2023-04-24: 1000 mL via INTRAVENOUS

## 2023-04-24 MED ORDER — KETOROLAC TROMETHAMINE 30 MG/ML IJ SOLN
30.0000 mg | Freq: Once | INTRAMUSCULAR | Status: AC
  Administered 2023-04-24: 30 mg via INTRAVENOUS
  Filled 2023-04-24: qty 1

## 2023-04-24 MED ORDER — IOHEXOL 300 MG/ML  SOLN
80.0000 mL | Freq: Once | INTRAMUSCULAR | Status: AC | PRN
  Administered 2023-04-24: 80 mL via INTRAVENOUS

## 2023-04-24 MED ORDER — DIPHENHYDRAMINE HCL 50 MG/ML IJ SOLN
50.0000 mg | Freq: Once | INTRAMUSCULAR | Status: AC
  Administered 2023-04-24: 50 mg via INTRAVENOUS
  Filled 2023-04-24: qty 1

## 2023-04-24 MED ORDER — DIPHENHYDRAMINE HCL 50 MG/ML IJ SOLN: 25.0000 mg | Freq: Once | INTRAMUSCULAR | Status: DC

## 2023-04-24 NOTE — ED Triage Notes (Signed)
 Pt here with abd pain that started today. Pt states the pain is near her umbilicus and she has a lump there that is achy and burns. Pt having nausea and vomiting but denies diarrhea. Pt state she take Zofran 3x daily and she is still vomiting.

## 2023-04-24 NOTE — ED Provider Notes (Signed)
 [facility] Provider Note    Event Date/Time   First MD Initiated Contact with Patient 04/24/23 1254     (approximate)   History   Chief Complaint Abdominal Pain   HPI  Kelsey Ho is a 33 y.o. female with past medical history of chronic pain syndrome and polysubstance abuse who presents to the ED complaining of abdominal pain.  Patient reports that since getting up today she has developed severe pain across much of her abdomen, worst at the umbilicus.  She describes a small "knot" near her umbilicus that is larger when she goes to stand up.  She has been feeling nauseous since the onset of pain, but denies any vomiting, also denies any diarrhea or constipation.  She denies any fevers, dysuria, or flank pain.     Physical Exam   Triage Vital Signs: ED Triage Vitals  Encounter Vitals Group     BP 04/24/23 1146 (!) 148/110     Systolic BP Percentile --      Diastolic BP Percentile --      Pulse Rate 04/24/23 1146 (!) 124     Resp 04/24/23 1146 17     Temp 04/24/23 1146 98.5 F (36.9 C)     Temp Source 04/24/23 1146 Oral     SpO2 04/24/23 1146 97 %     Weight 04/24/23 1147 136 lb 14.5 oz (62.1 kg)     Height 04/24/23 1147 5\' 1"  (1.549 m)     Head Circumference --      Peak Flow --      Pain Score 04/24/23 1147 10     Pain Loc --      Pain Education --      Exclude from Growth Chart --     Most recent vital signs: Vitals:   04/24/23 1345 04/24/23 1421  BP: (!) 150/108 (!) 146/111  Pulse: (!) 120 (!) 118  Resp:    Temp:    SpO2: 100% 100%    Constitutional: Alert and oriented. Eyes: Conjunctivae are normal. Head: Atraumatic. Nose: No congestion/rhinnorhea. Mouth/Throat: Mucous membranes are moist.  Cardiovascular: Normal rate, regular rhythm. Grossly normal heart sounds.  2+ radial pulses bilaterally. Respiratory: Normal respiratory effort.  No retractions. Lungs CTAB. Gastrointestinal: Soft and diffusely tender to palpation, greatest  in the periumbilical area. No distention. Musculoskeletal: No lower extremity tenderness nor edema.  Neurologic:  Normal speech and language. No gross focal neurologic deficits are appreciated.    ED Results / Procedures / Treatments   Labs (all labs ordered are listed, but only abnormal results are displayed) Labs Reviewed  COMPREHENSIVE METABOLIC PANEL - Abnormal; Notable for the following components:      Result Value   Glucose, Bld 102 (*)    All other components within normal limits  URINALYSIS, ROUTINE W REFLEX MICROSCOPIC - Abnormal; Notable for the following components:   Color, Urine STRAW (*)    APPearance CLEAR (*)    All other components within normal limits  LIPASE, BLOOD  CBC  POC URINE PREG, ED   RADIOLOGY CT abdomen/pelvis reviewed and interpreted by me with no inflammatory changes, focal fluid collections, or dilated bowel loops.  PROCEDURES:  Critical Care performed: No  Procedures   MEDICATIONS ORDERED IN ED: Medications  lactated ringers bolus 1,000 mL (1,000 mLs Intravenous New Bag/Given 04/24/23 1340)  morphine (PF) 4 MG/ML injection 4 mg (4 mg Intravenous Given 04/24/23 1335)  ondansetron (ZOFRAN) injection 4 mg (4 mg Intravenous Given 04/24/23  1334)  iohexol (OMNIPAQUE) 300 MG/ML solution 80 mL (80 mLs Intravenous Contrast Given 04/24/23 1354)  diphenhydrAMINE (BENADRYL) injection 50 mg (50 mg Intravenous Given 04/24/23 1430)  morphine (PF) 2 MG/ML injection 2 mg (2 mg Intravenous Given 04/24/23 1432)     IMPRESSION / MDM / ASSESSMENT AND PLAN / ED COURSE  I reviewed the triage vital signs and the nursing notes.                              33 y.o. female with no significant past medical history who presents to the ED complaining of diffuse abdominal pain with swelling around her umbilicus since getting up this morning.   Patient's presentation is most consistent with acute presentation with potential threat to life or bodily  function.  Differential diagnosis includes, but is not limited to, umbilical hernia, bowel obstruction, incarcerated hernia, appendicitis, colitis, diverticulitis, pancreatitis, hepatitis, anemia, electrolyte abnormality, AKI.  Patient nontoxic-appearing and in no acute distress, vital signs remarkable for tachycardia but otherwise reassuring.  Patient's abdomen is soft but she does have diffuse tenderness, particularly around her umbilicus.  We will further assess with CT imaging, labs are reassuring with no significant anemia, leukocytosis, lecture abnormality, or AKI.  LFTs and lipase are unremarkable, urinalysis shows no signs of infection.  Patient was given IV morphine and Zofran for symptomatic management, did develop a mild rash afterwards concerning for possible allergic reaction.  She was given IV Benadryl with improvement, no findings concerning for anaphylaxis.  Patient turned over to oncoming provider pending CT results.    FINAL CLINICAL IMPRESSION(S) / ED DIAGNOSES   Final diagnoses:  Periumbilical abdominal pain     Rx / DC Orders   ED Discharge Orders     None        Note:  This document was prepared using Dragon voice recognition software and may include unintentional dictation errors.   Chesley Noon, MD 04/24/23 1515

## 2023-04-24 NOTE — ED Provider Notes (Signed)
 Care assumed of patient from outgoing provider.  See their note for initial history, exam and plan.  Clinical Course as of 04/24/23 1555  Fri Apr 24, 2023  1505 Abd pain and ct pending.  Mild itching with morphine and zofran.  Benadryl given. Follow up ct [SM]    Clinical Course User Index [SM] Corena Herter, MD   On reevaluation, patient states she is feeling better.  Tolerating p.o.  Discussed close follow-up with a primary care physician if she has ongoing abdominal pain that she may need a referral to gastroenterology.  Discussed starting MiraLAX for findings concerning for constipation.  Discussed incidental finding of kidney stone.  Given return precautions.   Corena Herter, MD 04/24/23 [phone]

## 2023-04-24 NOTE — Discharge Instructions (Addendum)
 You are seen in the emergency department for abdominal pain.  You had a CT scan done that did not show any obvious abnormalities to explain your pain today.  You did have some findings concerning for constipation.  Start taking 1 capful of MiraLAX twice a day with plenty of water until you are having regular bowel movements.  Follow-up with your primary care provider.  If you have ongoing abdominal pain you may need a referral to gastroenterology.  If you have worsening symptoms return to the emergency department.  Thank you for choosing [place] for your health care, it was my pleasure to care for you today!  Corena Herter, MD

## 2023-05-04 ENCOUNTER — Other Ambulatory Visit (HOSPITAL_COMMUNITY): Payer: Self-pay

## 2023-05-07 ENCOUNTER — Encounter: Payer: Self-pay | Admitting: Internal Medicine

## 2023-05-07 ENCOUNTER — Ambulatory Visit: Payer: MEDICAID | Attending: Internal Medicine | Admitting: Internal Medicine

## 2023-05-07 ENCOUNTER — Ambulatory Visit: Payer: MEDICAID

## 2023-05-07 VITALS — BP 122/82 | HR 71 | Ht 61.0 in | Wt 130.2 lb

## 2023-05-07 DIAGNOSIS — R0602 Shortness of breath: Secondary | ICD-10-CM | POA: Diagnosis not present

## 2023-05-07 DIAGNOSIS — R079 Chest pain, unspecified: Secondary | ICD-10-CM

## 2023-05-07 DIAGNOSIS — R000 Tachycardia, unspecified: Secondary | ICD-10-CM

## 2023-05-07 DIAGNOSIS — R002 Palpitations: Secondary | ICD-10-CM | POA: Diagnosis not present

## 2023-05-07 NOTE — Patient Instructions (Signed)
 Medication Instructions:  Your physician recommends that you continue on your current medications as directed. Please refer to the Current Medication list given to you today.  *If you need a refill on your cardiac medications before your next appointment, please call your pharmacy*   Lab Work: No labs ordered today   If you have labs (blood work) drawn today and your tests are completely normal, you will receive your results only by: MyChart Message (if you have MyChart) OR A paper copy in the mail If you have any lab test that is abnormal or we need to change your treatment, we will call you to review the results.   Testing/Procedures: Your physician has requested that you have an echocardiogram. Echocardiography is a painless test that uses sound waves to create images of your heart. It provides your doctor with information about the size and shape of your heart and how well your heart's chambers and valves are working.   You may receive an ultrasound enhancing agent through an IV if needed to better visualize your heart during the echo. This procedure takes approximately one hour.  There are no restrictions for this procedure.  This will take place at [address] [facility] [address] [facility]) [address], [place] [address]  Please note: We ask at that you not bring children with you during ultrasound (echo/ vascular) testing. Due to room size and safety concerns, children are not allowed in the ultrasound rooms during exams. Our front office staff cannot provide observation of children in our lobby area while testing is being conducted. An adult accompanying a patient to their appointment will only be allowed in the ultrasound room at the discretion of the ultrasound technician under special circumstances. We apologize for any inconvenience.   Your physician has recommended that you wear a Zio monitor.   This monitor is a medical device that records the heart's electrical activity. Doctors  most often use these monitors to diagnose arrhythmias. Arrhythmias are problems with the speed or rhythm of the heartbeat. The monitor is a small device applied to your chest. You can wear one while you do your normal daily activities. While wearing this monitor if you have any symptoms to push the button and record what you felt. Once you have worn this monitor for the period of time provider prescribed (Usually 14 days), you will return the monitor device in the postage paid box. Once it is returned they will download the data collected and provide [place] with a report which the provider will then review and we will call you with those results. Important tips:  Avoid showering during the first 24 hours of wearing the monitor. Avoid excessive sweating to help maximize wear time. Do not submerge the device, no hot tubs, and no swimming pools. Keep any lotions or oils away from the patch. After 24 hours you may shower with the patch on. Take brief showers with your back facing the shower head.  Do not remove patch once it has been placed because that will interrupt data and decrease adhesive wear time. Push the button when you have any symptoms and write down what you were feeling. Once you have completed wearing your monitor, remove and place into box which has postage paid and place in your outgoing mailbox.  If for some reason you have misplaced your box then call our office and we can provide another box and/or mail it off for you.      Follow-Up: At [facility], you and  your health needs are our priority.  As part of our continuing mission to provide you with exceptional heart care, we have created designated Provider Care Teams.  These Care Teams include your primary Cardiologist (physician) and Advanced Practice Providers (APPs -  Physician Assistants and Nurse Practitioners) who all work together to provide you with the care you need, when you need it.  We recommend signing up for the  patient portal called "MyChart".  Sign up information is provided on this After Visit Summary.  MyChart is used to connect with patients for Virtual Visits (Telemedicine).  Patients are able to view lab/test results, encounter notes, upcoming appointments, etc.  Non-urgent messages can be sent to your provider as well.   To learn more about what you can do with MyChart, go to [email or web address].    Your next appointment:   6 week(s)  Provider:   You will see one of the following Advanced Practice Providers on your designated Care Team:   Nicolasa Ducking, NP Eula Listen, PA-C Cadence Fransico Michael, PA-C Charlsie Quest, NP Carlos Levering, NP

## 2023-05-07 NOTE — Progress Notes (Signed)
 Cardiology Office Note:  .   Date:  05/07/2023  ID:  Karl Bales, DOB August 30, 1990, MRN [id number] PCP: Mort Sawyers, FNP  [facility] Providers Cardiologist:  None     History of Present Illness: Kaidan Spengler is a 33 y.o. female with history of anxiety, ADHD, and polysubstance abuse, who has been referred for evaluation of chest pain, palpitations, and elevated heart rate/blood pressure.  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  Other than intermittent marijuana use, she stopped using these other drugs about a year ago (though she remains on prescribed lorazepam and Adderall).  Over the last year, she has experienced more frequent elevations in her heart rate as well as high blood pressures.  She also endorses sporadic chest pains that she describes as sometimes feeling like "electric shocks" and sometimes as tightness.  She endorses occasional dyspnea, most pronounced when she wakes up in the mornings or when she gets anxious.  She sleeps at a 45 degree angle due to buttock pain when lying flat following a surgical procedure while still living in [place].  She denies frank orthopnea.  She notes that her legs sometimes swell.  Ms. Zobel presented to the [facility] emergency department in late February with abdominal pain.  CT of the abdomen and pelvis showed bilateral nonobstructing nephrolithiasis but no acute intra-abdominal process.  Of note, she was given morphine for pain control and had a rash.  There was concern that her abdominal pain may have been due to constipation.  The ED visit, her heart rate and blood pressure were both elevated with blood pressures up to 148/110 and heart rate up to 124 bpm.  Toxicology screen was not performed.  ROS: See HPI  Studies Reviewed: Marland Kitchen   EKG Interpretation Date/Time:  Thursday May 07 2023 10:07:58 EDT Ventricular Rate:  71 PR  Interval:  130 QRS Duration:  72 QT Interval:  382 QTC Calculation: 415 R Axis:   44  Text Interpretation: Normal sinus rhythm Normal ECG When compared with ECG of 13-Jan-2023 20:34, Vent. rate has decreased BY  64 BPM Anterior T wave inversions have resolved Confirmed by Cottrell Gentles, Cristal Deer [phone]) on 05/07/2023 10:09:11 AM    Risk Assessment/Calculations:             Physical Exam:   VS:  BP 122/82 (BP Location: Left Arm, Patient Position: Sitting, Cuff Size: Normal)   Pulse 71   Ht 5\' 1"  (1.549 m)   Wt 130 lb 3.2 oz (59.1 kg)   SpO2 96%   BMI 24.60 kg/m    Wt Readings from Last 3 Encounters:  05/07/23 130 lb 3.2 oz (59.1 kg)  04/24/23 136 lb 14.5 oz (62.1 kg)  02/12/23 137 lb (62.1 kg)    General:  NAD. Neck: No JVD or HJR. Lungs: Clear to auscultation bilaterally without wheezes or crackles. Heart: Regular rate and rhythm without murmurs, rubs, or gallops. Abdomen: Soft, nontender, nondistended. Extremities: No lower extremity edema.  ASSESSMENT AND PLAN: .    Tachycardia and palpitations: Ms. Detore reports frequent elevated heart rates and palpitations over the last year coinciding with cessation of opioids and alprazolam (though she remains on lorazepam).  She is not tachycardic today.  Her EKG is normal.  Event monitor was previously ordered by her PCP, though Ms. Nealis never received this.  We will move forward with 14-day event monitor placement (ZIO XT)  for further evaluation.  I counseled Ms. Flaugher that pain and stress could be exacerbating her tachycardia.  Stimulants such as Adderall are likely also playing a role.  Atypical chest pain and shortness of breath: Ms. Laury Axon also reports episodic atypical chest pain and dyspnea.  Anxiety seems to be driving the symptoms.  However, given her history of polysubstance abuse, I think it would be prudent to obtain an echocardiogram to exclude underlying structural heart abnormalities.  My suspicion for ischemic heart disease  is low.  Elevated blood pressure reading: Blood pressures have been elevated in the past, particularly during ED visits.  I suspect that stress and pain were contributing to high blood pressures as blood pressure is relatively normal today.  No role for pharmacotherapy at this time.  I encouraged Ms. Lasch to monitor her blood pressure at home and bring her readings with her to her next office visit.    Dispo: Return to clinic in 6 weeks with APP.  Signed, Yvonne Kendall, MD

## 2023-05-14 ENCOUNTER — Other Ambulatory Visit (HOSPITAL_COMMUNITY): Payer: Self-pay

## 2023-06-03 ENCOUNTER — Ambulatory Visit: Payer: MEDICAID | Attending: Internal Medicine

## 2023-06-08 ENCOUNTER — Other Ambulatory Visit: Payer: Self-pay | Admitting: Family

## 2023-06-08 ENCOUNTER — Other Ambulatory Visit (HOSPITAL_COMMUNITY): Payer: Self-pay

## 2023-06-08 ENCOUNTER — Other Ambulatory Visit: Payer: Self-pay

## 2023-06-13 ENCOUNTER — Other Ambulatory Visit (HOSPITAL_COMMUNITY): Payer: Self-pay

## 2023-06-18 ENCOUNTER — Ambulatory Visit: Payer: MEDICAID | Attending: Medical | Admitting: Medical

## 2023-06-18 NOTE — Progress Notes (Deleted)
  Cardiology Office Note:  .   Date:  06/18/2023  ID:  Kelsey Ho, DOB 05-20-1990, MRN [id number] PCP: Felicita Horns, FNP  [facility] Providers Cardiologist:  None { Click to update primary MD,subspecialty MD or APP then REFRESH:1}   History of Present Illness: .   Kelsey Ho is a 33 y.o. female with a h/o ADHD, polysubstance abuse who is being seen for chest pain follow-up.   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  She reported she stopped using drugs about a year ago.  She reports more frequent elevations in her heart rate and blood pressures.  She also reported sporadic chest pain.  She was seen in the ER in February 2025 with abdominal pain.  CT of the abdomen and pelvis showed bilateral nonobstructing nephrolithiasis but no acute intra-abdominal process.  Patient was seen in the cardiology office March 2025 reporting frequent elevated heart rates and palpitations coinciding with cessation of opioids and alprazolam .  She was not tachycardic on EKG.  Heart monitor had been ordered but not completed.  ROS: ***  Studies Reviewed: .        *** Risk Assessment/Calculations:   {Does this patient have ATRIAL FIBRILLATION?:[id number]} No BP recorded.  {Refresh Note OR Click here to enter BP  :1}***       Physical Exam:   VS:  There were no vitals taken for this visit.   Wt Readings from Last 3 Encounters:  05/07/23 130 lb 3.2 oz (59.1 kg)  04/24/23 136 lb 14.5 oz (62.1 kg)  02/12/23 137 lb (62.1 kg)    GEN: Well nourished, well developed in no acute distress NECK: No JVD; No carotid bruits CARDIAC: ***RRR, no murmurs, rubs, gallops RESPIRATORY:  Clear to auscultation without rales, wheezing or rhonchi  ABDOMEN: Soft, non-tender, non-distended EXTREMITIES:  No edema; No deformity   ASSESSMENT AND PLAN: .   ***    {Are you ordering a CV Procedure (e.g.  stress test, cath, DCCV, TEE, etc)?   Press F2        :[id number]}  Dispo: ***  Signed, Deacon Gadbois Rebekah Canada, PA-C

## 2023-06-26 ENCOUNTER — Other Ambulatory Visit (HOSPITAL_COMMUNITY): Payer: Self-pay

## 2023-06-26 ENCOUNTER — Other Ambulatory Visit: Payer: Self-pay | Admitting: Family

## 2023-07-06 ENCOUNTER — Other Ambulatory Visit (HOSPITAL_COMMUNITY): Payer: Self-pay

## 2023-07-13 ENCOUNTER — Other Ambulatory Visit (HOSPITAL_COMMUNITY): Payer: Self-pay

## 2023-07-13 ENCOUNTER — Ambulatory Visit: Payer: Self-pay

## 2023-07-13 ENCOUNTER — Other Ambulatory Visit: Payer: Self-pay

## 2023-07-13 NOTE — Telephone Encounter (Signed)
 Copied from CRM [id number]. Topic: Clinical - Red Word Triage >> Jul 13, 2023  3:42 PM Kelsey Ho wrote: Red Word that prompted transfer to Nurse Triage: elevated bp 172-110   Spine specialists calling to report the elevated bp of 172/110.  Wanted pcp to be aware; denied sob, headache, dizziness, weakness.

## 2023-07-13 NOTE — Telephone Encounter (Signed)
 Per office note from Phys [facility] patient's blood pressure was 144/100 with note:  Patient states blood pressure is elevated today and she states that she has not taken her lorazepam  and questions if this has anything to do with it. She states that her primary care also watches her blood pressure closely as she times will become elevated. Denies any chest pain, headache, vision changes, shortness of breath she will seek treatment if she was to experience any of these. She we will recheck her blood pressure this afternoon and if it was to remain elevated follow-up with her PCP or seek treatment.   Will forward to Tabita Dugal for review.

## 2023-07-15 ENCOUNTER — Other Ambulatory Visit: Payer: Self-pay | Admitting: Family Medicine

## 2023-07-15 DIAGNOSIS — M5416 Radiculopathy, lumbar region: Secondary | ICD-10-CM

## 2023-07-15 NOTE — Telephone Encounter (Signed)
 Lvmtcb, sent mychart message

## 2023-07-15 NOTE — Telephone Encounter (Signed)
 Please have pt schedule office visit and to come in with blood pressure log.

## 2023-07-16 ENCOUNTER — Ambulatory Visit
Admission: RE | Admit: 2023-07-16 | Discharge: 2023-07-16 | Disposition: A | Discharge status: Home / Self Care | Payer: MEDICAID | Source: Ambulatory Visit | Attending: Family Medicine | Admitting: Family Medicine

## 2023-07-16 DIAGNOSIS — M5416 Radiculopathy, lumbar region: Secondary | ICD-10-CM

## 2023-07-23 ENCOUNTER — Ambulatory Visit: Payer: MEDICAID | Admitting: Family

## 2023-07-27 ENCOUNTER — Encounter (HOSPITAL_COMMUNITY): Payer: Self-pay

## 2023-07-28 ENCOUNTER — Other Ambulatory Visit (HOSPITAL_COMMUNITY): Payer: Self-pay

## 2023-07-29 ENCOUNTER — Other Ambulatory Visit (HOSPITAL_COMMUNITY): Payer: Self-pay

## 2023-07-30 ENCOUNTER — Other Ambulatory Visit (HOSPITAL_COMMUNITY): Payer: Self-pay

## 2023-07-31 ENCOUNTER — Ambulatory Visit: Payer: MEDICAID | Admitting: Family

## 2023-07-31 ENCOUNTER — Other Ambulatory Visit: Payer: Self-pay

## 2023-07-31 ENCOUNTER — Encounter: Payer: Self-pay | Admitting: *Deleted

## 2023-07-31 ENCOUNTER — Emergency Department: Payer: MEDICAID

## 2023-07-31 ENCOUNTER — Telehealth: Payer: Self-pay | Admitting: Family

## 2023-07-31 ENCOUNTER — Emergency Department
Admission: EM | Admit: 2023-07-31 | Discharge: 2023-07-31 | Disposition: A | Discharge status: Home / Self Care | Payer: MEDICAID | Attending: Emergency Medicine | Admitting: Emergency Medicine

## 2023-07-31 DIAGNOSIS — M62838 Other muscle spasm: Secondary | ICD-10-CM | POA: Diagnosis not present

## 2023-07-31 DIAGNOSIS — S50811A Abrasion of right forearm, initial encounter: Secondary | ICD-10-CM | POA: Insufficient documentation

## 2023-07-31 DIAGNOSIS — W19XXXA Unspecified fall, initial encounter: Secondary | ICD-10-CM

## 2023-07-31 DIAGNOSIS — W01198A Fall on same level from slipping, tripping and stumbling with subsequent striking against other object, initial encounter: Secondary | ICD-10-CM | POA: Insufficient documentation

## 2023-07-31 DIAGNOSIS — M542 Cervicalgia: Secondary | ICD-10-CM | POA: Diagnosis not present

## 2023-07-31 DIAGNOSIS — S39012A Strain of muscle, fascia and tendon of lower back, initial encounter: Secondary | ICD-10-CM | POA: Insufficient documentation

## 2023-07-31 DIAGNOSIS — S3992XA Unspecified injury of lower back, initial encounter: Secondary | ICD-10-CM | POA: Diagnosis present

## 2023-07-31 DIAGNOSIS — S0990XA Unspecified injury of head, initial encounter: Secondary | ICD-10-CM | POA: Diagnosis not present

## 2023-07-31 DIAGNOSIS — T148XXA Other injury of unspecified body region, initial encounter: Secondary | ICD-10-CM

## 2023-07-31 LAB — POC URINE PREG, ED: Preg Test, Ur: NEGATIVE

## 2023-07-31 MED ORDER — CYCLOBENZAPRINE HCL 10 MG PO TABS: 10.0000 mg | ORAL_TABLET | Freq: Three times a day (TID) | ORAL | 0 refills | Status: AC | PRN

## 2023-07-31 MED ORDER — METOCLOPRAMIDE HCL 10 MG PO TABS
10.0000 mg | ORAL_TABLET | Freq: Once | ORAL | Status: AC
  Administered 2023-07-31: 10 mg via ORAL
  Filled 2023-07-31: qty 1

## 2023-07-31 MED ORDER — OXYCODONE-ACETAMINOPHEN 7.5-325 MG PO TABS: 1.0000 | ORAL_TABLET | ORAL | 0 refills | Status: DC | PRN

## 2023-07-31 MED ORDER — ACETAMINOPHEN 325 MG PO TABS
650.0000 mg | ORAL_TABLET | Freq: Once | ORAL | Status: AC
  Administered 2023-07-31: 650 mg via ORAL
  Filled 2023-07-31: qty 2

## 2023-07-31 MED ORDER — HYDROMORPHONE HCL 1 MG/ML IJ SOLN
1.0000 mg | Freq: Once | INTRAMUSCULAR | Status: AC
  Administered 2023-07-31: 1 mg via INTRAMUSCULAR
  Filled 2023-07-31: qty 1

## 2023-07-31 MED ORDER — METOCLOPRAMIDE HCL 10 MG PO TABS: 10.0000 mg | ORAL_TABLET | Freq: Three times a day (TID) | ORAL | 0 refills | Status: DC

## 2023-07-31 NOTE — ED Notes (Signed)
 Patient transported back from CT

## 2023-07-31 NOTE — ED Triage Notes (Signed)
 Coming from home. Pt was tripped Pt enough for her to fall rapidly on her butt and c/o neck and lower back pain. Pt has not been prescribed HTN medicine, but knows she has HTN. C/o nausea and HA. GCS 15

## 2023-07-31 NOTE — Telephone Encounter (Signed)
 Spoke with pt and she has been reschedule with Tabitha on 08/13/23 at 1120.

## 2023-07-31 NOTE — ED Provider Notes (Signed)
 [facility] Provider Note    Event Date/Time   First MD Initiated Contact with Patient 07/31/23 1801     (approximate)   History   Headache and Fall    HPI  Kelsey Ho is a 33 y.o. female    with a past medical history of lumbar radiculopathy, tachycardia, fibromyalgia, B12 deficiency,  who presents to the ED complaining of back pain, neck pain, headache. According to the patient, she was playing with her dog, she fell landing on her lumbar area and hitting her occipital area of the neck.  Patient denies loss of consciousness, she is not taking blood thinners.  Patient states feeling dizzy, with frontal headache.  Patient is allergic to ibuprofen , morphine .    ROS: Patient currently denies any vision changes, tinnitus, difficulty speaking, facial droop, sore throat, chest pain, shortness of breath, abdominal pain, nausea/vomiting/diarrhea, dysuria, or weakness/numbness/paresthesias in any extremity   Physical Exam   Triage Vital Signs: ED Triage Vitals  Encounter Vitals Group     BP 07/31/23 1743 (!) 142/127     Systolic BP Percentile --      Diastolic BP Percentile --      Pulse Rate 07/31/23 1737 94     Resp 07/31/23 1745 16     Temp 07/31/23 1737 98.9 F (37.2 C)     Temp Source 07/31/23 1737 Oral     SpO2 07/31/23 1737 97 %     Weight --      Height --      Head Circumference --      Peak Flow --      Pain Score --      Pain Loc --      Pain Education --      Exclude from Growth Chart --     Most recent vital signs: Vitals:   07/31/23 1743 07/31/23 1745  BP: (!) 142/127   Pulse:    Resp:  16  Temp:    SpO2:       Constitutional: Alert, NAD. Able to speak in complete sentences without cough or dyspnea, hypertensive Eyes: Conjunctivae are normal.  Head: Atraumatic. Nose: No congestion/rhinnorhea. Mouth/Throat: Mucous membranes are moist.   Neck: Skin is intact, no presence of ecchymosis or hematomas.  Tender to palpation at  the level of the spinal process C1-C2.  painless ROM. Supple. No JVD, nodes, thyromegaly  Cardiovascular:   Good peripheral circulation.RRR no murmurs, gallops, rubs  Respiratory: Normal respiratory effort.  No retractions. Clear to auscultation bilaterally without wheezing or crackles  Gastrointestinal: Soft and nontender.  Musculoskeletal:  no deformity Lumbar spine: Skin is intact, no ecchymosis or hematomas, tender to palpation in the lumbar spinal area, and paraspinal muscles.  No signs of radiculopathy.  Right forearm: Presence of abrasion of the skin, no active bleeding.  Neurologic:  MAE spontaneously. No gross focal neurologic deficits are appreciated.  Skin:  Skin is warm, dry and intact. No rash noted. Psychiatric: Mood and affect are normal. Speech and behavior are normal.    ED Results / Procedures / Treatments   Labs (all labs ordered are listed, but only abnormal results are displayed) Labs Reviewed  POC URINE PREG, ED     EKG     RADIOLOGY I independently reviewed and interpreted imaging and agree with radiologists findings.      PROCEDURES:  Critical Care performed:   Procedures   MEDICATIONS ORDERED IN ED: Medications  acetaminophen  (TYLENOL ) tablet 650 mg (650 mg  Oral Given 07/31/23 1902)  metoCLOPramide  (REGLAN ) tablet 10 mg (10 mg Oral Given 07/31/23 2010)  HYDROmorphone  (DILAUDID ) injection 1 mg (1 mg Intramuscular Given 07/31/23 2011)   Clinical Course as of 07/31/23 2021  Fri Jul 31, 2023  1924 Per nurse patient is having nauseas, I will order Reglan .  Patient states Zofran  gives her more nauseous [AE]  1925 CT Head Wo Contrast No acute intracranial abnormality. [AE]  1925 CT Cervical Spine Wo Contrast No acute bony abnormality. [AE]  1954 Reassessed the patient, patient refers having headache.  Updated patient with results of cervical CT, head CT, lumbar spine.  I will discharge patient with Flexeril  and Percocet.  Patient is having follow-up  appointment this week for her chronic lumbar pain.  Patient is agreeable with the plan [AE]    Clinical Course User Index [AE] Awilda Lennox, PA-C    IMPRESSION / MDM / ASSESSMENT AND PLAN / ED COURSE  I reviewed the triage vital signs and the nursing notes.  Differential diagnosis includes, but is not limited to, fall, fracture, dislocation, herniated disc, muscle strain, headache.   Patient's presentation is most consistent with acute complicated illness / injury requiring diagnostic workup.   Kelsey Lowdenis a 33 y.o., female who presents today after having a fall, hitting her occipital area and lumbar area, patient did not have loss of consciousness.  I did order CT of the head, CT of the neck and lumbar x-ray.  During admission patient was feeling with nauseas, I ordered Reglan .  Patient patient received acetaminophen  650 mg, Dilaudid  intramuscular for pain.  Patient's diagnosis is consistent with fall, cervical strain, lumbar strain, right arm abrasion. I independently reviewed and interpreted imaging and agree with radiologists findings. Labs are  reassuring ruling out pregnancy. I did review the patient's allergies and medications.The patient is in stable and satisfactory condition for discharge home  Patient will be discharged home with prescriptions for Percocet, Flexeril , and reglan . Patient is to follow up with PCP as needed or otherwise directed. Patient is given ED precautions to return to the ED for any worsening or new symptoms. Discussed plan of care with patient, answered all of patient's questions, Patient agreeable to plan of care. Advised patient to take medications according to the instructions on the label. Discussed possible side effects of new medications. Patient verbalized understanding.   FINAL CLINICAL IMPRESSION(S) / ED DIAGNOSES   Final diagnoses:  Fall, initial encounter  Injury of head, initial encounter  Strain of lumbar region, initial encounter  Spasm of  cervical paraspinous muscle  Abrasion     Rx / DC Orders   ED Discharge Orders          Ordered    oxyCODONE -acetaminophen  (PERCOCET) 7.5-325 MG tablet  Every 4 hours PRN        07/31/23 2020    metoCLOPramide  (REGLAN ) 10 MG tablet  3 times daily with meals        07/31/23 2020    cyclobenzaprine  (FLEXERIL ) 10 MG tablet  3 times daily PRN        07/31/23 2020             Note:  This document was prepared using Dragon voice recognition software and may include unintentional dictation errors.   Awilda Lennox, PA-C 07/31/23 2021    Bradler, Evan K, MD 08/01/23 2106

## 2023-07-31 NOTE — Telephone Encounter (Signed)
 Pt is scheduled today at 1040 with Tabitha. This appointment was made on MyChart and is scheduled in a 20 min slot. Pt needs a 40 min appointment per Tabitha. LM for pt to return call.

## 2023-07-31 NOTE — Discharge Instructions (Addendum)
 You have been diagnosed with mechanical fall, injury of the head, cervical disc spasm, lumbar strain, arm abrasion.  This take Flexeril  1 tablet by mouth every 8 hours after main meals.  Please take Reglan 1 tablet by mouth 20 minutes before main meals for nauseous as needed.  Please take Percocet 1 tablet by mouth every 6 hours as needed for pain.  Please go to your appointment this week for a follow-up of your lumbar back pain.  These come back to ED or go to your PCP if you have new symptoms or symptoms worsen.  Please check your blood pressure at home and make an appointment with your PCP for a follow-up.

## 2023-08-13 ENCOUNTER — Ambulatory Visit: Payer: MEDICAID | Admitting: Family

## 2023-08-13 ENCOUNTER — Encounter: Payer: Self-pay | Admitting: Family

## 2023-08-13 DIAGNOSIS — Z3041 Encounter for surveillance of contraceptive pills: Secondary | ICD-10-CM

## 2023-08-14 ENCOUNTER — Other Ambulatory Visit: Payer: Self-pay

## 2023-08-17 ENCOUNTER — Other Ambulatory Visit: Payer: Self-pay

## 2023-08-17 ENCOUNTER — Encounter (HOSPITAL_COMMUNITY): Payer: Self-pay

## 2023-08-17 ENCOUNTER — Other Ambulatory Visit (HOSPITAL_COMMUNITY): Payer: Self-pay

## 2023-08-17 NOTE — Telephone Encounter (Signed)
 Appt has been scheduled.

## 2023-08-24 ENCOUNTER — Other Ambulatory Visit: Payer: Self-pay

## 2023-08-27 ENCOUNTER — Ambulatory Visit (INDEPENDENT_AMBULATORY_CARE_PROVIDER_SITE_OTHER): Payer: MEDICAID | Admitting: Family

## 2023-08-27 ENCOUNTER — Encounter: Payer: Self-pay | Admitting: Family

## 2023-08-27 VITALS — BP 162/105 | Temp 98.4°F | Ht 61.0 in | Wt 141.0 lb

## 2023-08-27 DIAGNOSIS — D582 Other hemoglobinopathies: Secondary | ICD-10-CM

## 2023-08-27 DIAGNOSIS — E876 Hypokalemia: Secondary | ICD-10-CM

## 2023-08-27 DIAGNOSIS — E538 Deficiency of other specified B group vitamins: Secondary | ICD-10-CM | POA: Diagnosis not present

## 2023-08-27 DIAGNOSIS — Z13220 Encounter for screening for lipoid disorders: Secondary | ICD-10-CM

## 2023-08-27 DIAGNOSIS — R739 Hyperglycemia, unspecified: Secondary | ICD-10-CM | POA: Diagnosis not present

## 2023-08-27 DIAGNOSIS — Z79899 Other long term (current) drug therapy: Secondary | ICD-10-CM | POA: Diagnosis not present

## 2023-08-27 DIAGNOSIS — I159 Secondary hypertension, unspecified: Secondary | ICD-10-CM

## 2023-08-27 DIAGNOSIS — I10 Essential (primary) hypertension: Secondary | ICD-10-CM

## 2023-08-27 LAB — COMPREHENSIVE METABOLIC PANEL WITH GFR
ALT: 12 U/L (ref 0–35)
AST: 18 U/L (ref 0–37)
Albumin: 5.1 g/dL (ref 3.5–5.2)
Alkaline Phosphatase: 61 U/L (ref 39–117)
BUN: 12 mg/dL (ref 6–23)
CO2: 31 meq/L (ref 19–32)
Calcium: 10.3 mg/dL (ref 8.4–10.5)
Chloride: 99 meq/L (ref 96–112)
Creatinine, Ser: 0.86 mg/dL (ref 0.40–1.20)
GFR: 89.12 mL/min (ref >60.00)
Glucose, Bld: 92 mg/dL (ref 70–99)
Potassium: 4.1 meq/L (ref 3.5–5.1)
Sodium: 139 meq/L (ref 135–145)
Total Bilirubin: 0.8 mg/dL (ref 0.2–1.2)
Total Protein: 7.8 g/dL (ref 6.0–8.3)

## 2023-08-27 LAB — CBC WITH DIFFERENTIAL/PLATELET
Basophils Absolute: 0.1 10*3/uL (ref 0.0–0.1)
Basophils Relative: 1.2 % (ref 0.0–3.0)
Eosinophils Absolute: 0.1 10*3/uL (ref 0.0–0.7)
Eosinophils Relative: 1.2 % (ref 0.0–5.0)
HCT: 42.2 % (ref 36.0–46.0)
Hemoglobin: 14.2 g/dL (ref 12.0–15.0)
Lymphocytes Relative: 19.1 % (ref 12.0–46.0)
Lymphs Abs: 1.9 10*3/uL (ref 0.7–4.0)
MCHC: 33.7 g/dL (ref 30.0–36.0)
MCV: 87.4 fl (ref 78.0–100.0)
Monocytes Absolute: 0.6 10*3/uL (ref 0.1–1.0)
Monocytes Relative: 5.8 % (ref 3.0–12.0)
Neutro Abs: 7.1 10*3/uL (ref 1.4–7.7)
Neutrophils Relative %: 72.7 % (ref 43.0–77.0)
Platelets: 398 10*3/uL (ref 150.0–400.0)
RBC: 4.83 Mil/uL (ref 3.87–5.11)
RDW: 13.1 % (ref 11.5–15.5)
WBC: 9.8 10*3/uL (ref 4.0–10.5)

## 2023-08-27 LAB — LIPID PANEL
Cholesterol: 268 mg/dL — ABNORMAL HIGH (ref 0–200)
HDL: 68.9 mg/dL (ref >39.00)
LDL Cholesterol: 177 mg/dL — ABNORMAL HIGH (ref 0–99)
NonHDL: 199.33
Total CHOL/HDL Ratio: 4
Triglycerides: 114 mg/dL (ref 0.0–149.0)
VLDL: 22.8 mg/dL (ref 0.0–40.0)

## 2023-08-27 LAB — T3, FREE: T3, Free: 2.8 pg/mL (ref 2.3–4.2)

## 2023-08-27 LAB — VITAMIN B12: Vitamin B-12: 601 pg/mL (ref 211–911)

## 2023-08-27 LAB — TSH: TSH: 0.66 u[IU]/mL (ref 0.35–5.50)

## 2023-08-27 LAB — HEMOGLOBIN A1C: Hgb A1c MFr Bld: 5.3 % (ref 4.6–6.5)

## 2023-08-27 LAB — T4, FREE: Free T4: 1.02 ng/dL (ref 0.60–1.60)

## 2023-08-27 NOTE — Assessment & Plan Note (Signed)
 Will obtain UDS today to r/o substance use as would like to confirm negative prior to initiating hypertensive therapy. Did discuss that substances can put her at increased risk for serious side effects even fatality if used with blood pressure medications.  Pending UDS will prescribe losartan 50 mg once daily.  Pt overdue for cardilogy f/u so advised her to schedule this appt, she is going to call.  She has zio monitor and will attempt to get this set up.  ER precautions advised.  Will obtain cbc tsh to r/o other etiology such as thyroid  disease

## 2023-08-27 NOTE — Progress Notes (Signed)
 Established Patient Office Visit  Subjective:   Patient ID: Kelsey Ho, female    DOB: Aug 30, 1990  Age: 33 y.o. MRN: [id number]  CC:  Chief Complaint  Patient presents with   Elevated BP    Here with her mother. Has been to [facility] and other doctors and her BP has been up.    Low Back Pain and Ankle Pain    HPI: Kelsey Ho is a 33 y.o. female presenting on 08/27/2023 for Elevated BP (Here with her mother. Has been to [facility] and other doctors and her BP has been up. ) and Low Back Pain and Ankle Pain  She did see cardiology back in march was sent for a zio monitor however she has received this and unable to set it up due to an issue with registration. She has no showed her last two cardiology appts with Dr. Mady. The visit in march suggested for her to take AHBPM and f/u in six weeks. She does state at home blood pressure systolic is in the 140/150 level and diastolic is usually around the 100's   She does report chest tightness worse in the am and she will get red on her chest and gets sweaty and does feel anxious in those moments. She states she is on lorazepam  twice daily and trintellix  once daily but she states that the anxiety is still heightened.        ROS: Negative unless specifically indicated above in HPI.   Relevant past medical history reviewed and updated as indicated.   Allergies and medications reviewed and updated.   Current Outpatient Medications:    cyanocobalamin  (VITAMIN B12) 1000 MCG/ML injection, INJECT 1 MILLILITER ONCE 30 DAYS, Disp: , Rfl:    LO LOESTRIN FE  1 MG-10 MCG / 10 MCG tablet, Take 1 tablet by mouth daily., Disp: 28 tablet, Rfl: 11   LORazepam  (ATIVAN ) 1 MG tablet, Take 1 mg by mouth 3 (three) times daily., Disp: , Rfl:    ondansetron  (ZOFRAN ) 8 MG tablet, 1 tablet Orally every 8 hours 30 day(s), Disp: 90 tablet, Rfl: 2   tiZANidine  (ZANAFLEX ) 4 MG tablet, Take 1 tablet (4 mg total) by mouth every 12 (twelve) hours as needed., Disp: 60 tablet, Rfl: 2    TRINTELLIX  20 MG TABS tablet, Take 20 mg by mouth daily., Disp: , Rfl:    zolpidem  (AMBIEN  CR) 12.5 MG CR tablet, Take 1 tablet (12.5 mg total) by mouth at bedtime as needed., Disp: 30 tablet, Rfl: 0   amphetamine-dextroamphetamine (ADDERALL XR) 10 MG 24 hr capsule, Take 10 mg by mouth 2 (two) times daily. (Patient not taking: Reported on 08/27/2023), Disp: , Rfl:    amphetamine-dextroamphetamine (ADDERALL) 5 MG tablet, Take 1 tablet by mouth daily. (Patient not taking: Reported on 08/27/2023), Disp: , Rfl:   Allergies  Allergen Reactions   Ibuprofen  Nausea And Vomiting   Nortriptyline Hcl     Worsening mood   Morphine  Sulfate Rash    Objective:   BP (!) 162/105   Temp 98.4 F (36.9 C) (Oral)   Ht 5' 1 (1.549 m)   Wt 141 lb (64 kg)   BMI 26.64 kg/m    Physical Exam Vitals reviewed.  Constitutional:      General: She is not in acute distress.    Appearance: Normal appearance. She is normal weight. She is not ill-appearing, toxic-appearing or diaphoretic.  HENT:     Head: Normocephalic.  Cardiovascular:     Rate and Rhythm: Normal rate and  regular rhythm.  Pulmonary:     Effort: Pulmonary effort is normal.  Musculoskeletal:        General: Normal range of motion.  Neurological:     General: No focal deficit present.     Mental Status: She is alert and oriented to person, place, and time. Mental status is at baseline.  Psychiatric:        Mood and Affect: Mood normal.        Behavior: Behavior normal.        Thought Content: Thought content normal.        Judgment: Judgment normal.     Assessment & Plan:  Primary hypertension -     TSH -     T4, free -     T3, free -     Hemoglobin A1c  High risk medication use -     DRUG MONITORING, PANEL 8 WITH CONFIRMATION, URINE  B12 deficiency -     CBC with Differential/Platelet -     Vitamin B12  Elevated hemoglobin (HCC)  Low blood potassium  Screening for lipoid disorders -     Lipid panel  Hyperglycemia -      Hemoglobin A1c -     Comprehensive metabolic panel with GFR  Secondary hypertension Assessment & Plan: Will obtain UDS today to r/o substance use as would like to confirm negative prior to initiating hypertensive therapy. Did discuss that substances can put her at increased risk for serious side effects even fatality if used with blood pressure medications.  Pending UDS will prescribe losartan 50 mg once daily.  Pt overdue for cardilogy f/u so advised her to schedule this appt, she is going to call.  She has zio monitor and will attempt to get this set up.  ER precautions advised.  Will obtain cbc tsh to r/o other etiology such as thyroid  disease       Follow up plan: Return in about 2 weeks (around 09/10/2023) for f/u blood pressure.  Ginger Patrick, FNP

## 2023-08-31 ENCOUNTER — Telehealth: Payer: Self-pay | Admitting: Family

## 2023-08-31 ENCOUNTER — Ambulatory Visit: Payer: Self-pay | Admitting: Family

## 2023-08-31 DIAGNOSIS — I159 Secondary hypertension, unspecified: Secondary | ICD-10-CM

## 2023-08-31 LAB — DRUG MONITORING, PANEL 8 WITH CONFIRMATION, URINE
6 Acetylmorphine: NEGATIVE ng/mL (ref <10)
Alcohol Metabolites: NEGATIVE ng/mL (ref <500)
Alphahydroxyalprazolam: NEGATIVE ng/mL (ref <25)
Alphahydroxymidazolam: NEGATIVE ng/mL (ref <50)
Alphahydroxytriazolam: NEGATIVE ng/mL (ref <50)
Aminoclonazepam: NEGATIVE ng/mL (ref <25)
Amphetamines: NEGATIVE ng/mL (ref <500)
Benzodiazepines: POSITIVE ng/mL — AB (ref <100)
Buprenorphine, Urine: POSITIVE ng/mL — AB (ref <5)
Buprenorphine: 7 ng/mL — ABNORMAL HIGH (ref <2)
Cocaine Metabolite: NEGATIVE ng/mL (ref <150)
Creatinine: 99.2 mg/dL (ref >=20.0)
Hydroxyethylflurazepam: NEGATIVE ng/mL (ref <50)
Lorazepam: 1368 ng/mL — ABNORMAL HIGH (ref <50)
MDMA: NEGATIVE ng/mL (ref <500)
Marijuana Metabolite: NEGATIVE ng/mL (ref <20)
Marijuana Metabolite: NEGATIVE ng/mL (ref <5)
Naloxone: NEGATIVE ng/mL (ref <2)
Norbuprenorphine: 13 ng/mL — ABNORMAL HIGH (ref <2)
Nordiazepam: NEGATIVE ng/mL (ref <50)
Noroxycodone: 133 ng/mL — ABNORMAL HIGH (ref <50)
Opiates: NEGATIVE ng/mL (ref <100)
Oxazepam: NEGATIVE ng/mL (ref <50)
Oxidant: NEGATIVE ug/mL (ref <200)
Oxycodone: NEGATIVE ng/mL (ref <50)
Oxycodone: POSITIVE ng/mL — AB (ref <100)
Oxymorphone: 558 ng/mL — ABNORMAL HIGH (ref <50)
Temazepam: NEGATIVE ng/mL (ref <50)
pH: 7.1 (ref 4.5–9.0)

## 2023-08-31 LAB — DM TEMPLATE

## 2023-08-31 NOTE — Telephone Encounter (Signed)
 Good afternoon,   Our mutual pt was here for her hypertension, still very high averaging 140/150 over 110  I tested her urine drug screen because I was worried for cocaine. Cocaine was negative and I gave her a long discussion on if I were to treat her blood pressure with medication if she were to take cocaine this could be fatal. She was positive for opioids however I do not see an issue here as far as blood pressure medication is concerned.   I wanted to correlate with you because she was seen for a consult with you recently, and you had held off on medications. At this point, is it reasonably ok to start losartan ?

## 2023-09-01 ENCOUNTER — Other Ambulatory Visit: Payer: Self-pay

## 2023-09-01 MED ORDER — LOSARTAN POTASSIUM 50 MG PO TABS
50.0000 mg | ORAL_TABLET | Freq: Every day | ORAL | 0 refills | Status: DC
  Filled 2023-09-01: qty 30, 30d supply, fill #0

## 2023-09-01 NOTE — Telephone Encounter (Signed)
 Pt notified as instructed and pt voiced understanding; pt will start losartan  50 mg daily and will monitor and log BP daily. Pt scheduled FU appt with ONEIDA Patrick FNP on 09/08/23 at 11:20. Pt will cb if any questions or concerns.

## 2023-09-03 NOTE — Telephone Encounter (Signed)
 Please triage

## 2023-09-03 NOTE — Telephone Encounter (Signed)
 I spoke with pt; pt said she took losartan  25 mg between 3 pm - 4 pm  and at 4:45 pm BP 177/111. Pt has CP and tightness mid chest that radiates into Lt side of neck; pt has SOB. Pt said her whole body is red with more redness to face and chest. Pt has H/A pain level 4 and is lightheaded. Pt said she has not had anything to upset her and no pain noted. I spoke with CHRISTELLA Crandall NP and pt is to go to ED now. Pt said she has someone to drive her to ED now. Pt is going to [facility] ED. Sending note to T Dugal FNP and CHRISTELLA Crandall NP. Pt will cb on 09/04/23 with update.

## 2023-09-03 NOTE — Telephone Encounter (Signed)
 If her BP is running consistent above 140/90, I agree with initiating pharmacotherapy for hypertension.  Losartan  is a reasonable first-line treatment option.  Lonni Hanson, MD [facility]

## 2023-09-04 NOTE — Telephone Encounter (Signed)
 Still unable to reach pt or pts mother.

## 2023-09-04 NOTE — Telephone Encounter (Signed)
 I spoke with pts mom (DPR signed) and she said pt was there; Pts mom told pt her doctors office was on the phone and pt yelled and said I don't wont to talk with anyone. I explained that T Dugal FNP could not see where pt went to ED and wondered if pt went for eval outside of [facility] network. Pt's mom said  pt did not go anywhere. I asked pts mom how pt was feeling today and to see if pt was feeling better. Pts mom asked pt and pt yelled no I'm no better. I advised pts mom what T Dugal FNP had said in message and pts mom took Dr Lonni Ends contact phone #. [phone]. I asked if pts mom was going to call Dr Ends office and she said no she would let pt do that. UC & ED precautions given to pts  mom. Sending note to ONEIDA Patrick FNP who is out of office and Adina Crandall NP who is in office.

## 2023-09-04 NOTE — Telephone Encounter (Signed)
 Still unable to reach pt or pts mom.

## 2023-09-04 NOTE — Telephone Encounter (Signed)
 Does not look like pt went to the ER.  Thanks for speaking with her Rena.   Could we please call pt to check in with her this am and see if maybe she went to an external hospital outside of [facility]?  Have her call her cardiologist as well for an urgent appointment (once she is at a more stable place)

## 2023-09-04 NOTE — Telephone Encounter (Signed)
 Unable to reach pt by phone and left v/m requesting pt to call [facility] [phone]; unable to reach pts mom(DPR signed) v/m box full. I also my charted pt to call [facility] [phone]. Will continue to try and reach pt. Sending note to [facility] triage, FYI to ONEIDA Patrick FNP and [facility] pool.

## 2023-09-05 NOTE — Telephone Encounter (Signed)
 noted

## 2023-09-07 ENCOUNTER — Other Ambulatory Visit: Payer: Self-pay

## 2023-09-08 ENCOUNTER — Ambulatory Visit: Payer: MEDICAID | Admitting: Family

## 2023-09-08 ENCOUNTER — Ambulatory Visit (INDEPENDENT_AMBULATORY_CARE_PROVIDER_SITE_OTHER): Payer: MEDICAID | Admitting: Family

## 2023-09-08 VITALS — BP 84/68 | HR 84 | Temp 99.2°F | Ht 61.0 in | Wt 145.0 lb

## 2023-09-08 DIAGNOSIS — I159 Secondary hypertension, unspecified: Secondary | ICD-10-CM | POA: Diagnosis not present

## 2023-09-08 DIAGNOSIS — F1491 Cocaine use, unspecified, in remission: Secondary | ICD-10-CM | POA: Insufficient documentation

## 2023-09-08 DIAGNOSIS — E049 Nontoxic goiter, unspecified: Secondary | ICD-10-CM | POA: Diagnosis not present

## 2023-09-08 NOTE — Assessment & Plan Note (Signed)
 Identified on exam. Ordering u/s thyroid .  Tsh wnl on the lower end of normal.

## 2023-09-08 NOTE — Patient Instructions (Addendum)
  Call cardiology to get in for a sooner appt.    ------------------------------------  I have sent in your order electronically for the following: ultrasound thyroid   at this location below. Please call to schedule the appointment at your convenience  [facility] outpatient imaging center off [address], [place] [address] Phone [phone]-  8-5 pm   ------------------------------------

## 2023-09-08 NOTE — Progress Notes (Signed)
 Established Patient Office Visit  Subjective:      CC:  Chief Complaint  Patient presents with   Follow-up BP    Here with mom.  Checks BP at home. Lower in the mornings due to Ambien  from the night before. States BP is everywhere. Taking 1/4 tab of losartan  50mg .     HPI: Kelsey Ho is a 33 y.o. female presenting on 09/08/2023 for Follow-up BP (Here with mom. [place] BP at home. Lower in the mornings due to Ambien  from the night before. States BP is everywhere. Taking 1/4 tab of losartan  50mg . ) . HTN: was started last visit on losartan  50 mg once daily however once she started it her blood pressure went below 100/70 and she felt red, hot and uncomfortable. She states since then she has decreased her blood pressure medication to 1/4 pill of the losartan  and today blood pressure is 84/68, and she is feeling lightheaded currently, she ate a small piece of a brownie this am but has not drank any water. Woke up something less than hour ago. She states 'blood pressure is all over the place'. She states blood pressure at times will be less than 100/70 and the other times very high like 170/110. She is currently on a heart monitor and currently is wearing. She does not mention any palpitations, chest pain and or shortness of breath. No blurry vision, some headaches.   She has consulted with cardiology but has not make a follow up appt as of yet.   Sleep disorder: following with psychiatry and she is still taking ambien  12.5 mg CR, adderall on hold due to blood pressure being uncontrolled.   Biotin, collagen, coconut oil liposomal glutathione, and 22 vitamins  Pt confirms she is not taking any additional medications that she has not been prescribed, and she is not taking adderall due to her high blood pressure    Social history:  Relevant past medical, surgical, family and social history reviewed and updated as indicated. Interim medical history since our last visit  reviewed.  Allergies and medications reviewed and updated.  DATA REVIEWED: CHART IN EPIC     ROS: Negative unless specifically indicated above in HPI.    Current Outpatient Medications:    cyanocobalamin  (VITAMIN B12) 1000 MCG/ML injection, INJECT 1 MILLILITER ONCE 30 DAYS, Disp: , Rfl:    LO LOESTRIN FE  1 MG-10 MCG / 10 MCG tablet, Take 1 tablet by mouth daily., Disp: 28 tablet, Rfl: 11   LORazepam  (ATIVAN ) 1 MG tablet, Take 1 mg by mouth 3 (three) times daily., Disp: , Rfl:    ondansetron  (ZOFRAN ) 8 MG tablet, 1 tablet Orally every 8 hours 30 day(s), Disp: 90 tablet, Rfl: 2   tiZANidine  (ZANAFLEX ) 4 MG tablet, Take 1 tablet (4 mg total) by mouth every 12 (twelve) hours as needed., Disp: 60 tablet, Rfl: 2   TRINTELLIX  20 MG TABS tablet, Take 20 mg by mouth daily., Disp: , Rfl:    zolpidem  (AMBIEN  CR) 12.5 MG CR tablet, Take 1 tablet (12.5 mg total) by mouth at bedtime as needed., Disp: 30 tablet, Rfl: 0   amphetamine-dextroamphetamine (ADDERALL XR) 10 MG 24 hr capsule, Take 10 mg by mouth 2 (two) times daily. (Patient not taking: Reported on 09/08/2023), Disp: , Rfl:    amphetamine-dextroamphetamine (ADDERALL) 5 MG tablet, Take 1 tablet by mouth daily. (Patient not taking: Reported on 09/08/2023), Disp: , Rfl:       Objective:    BP (!) 84/68 (BP Location:  Left Arm, Patient Position: Sitting, Cuff Size: Normal)   Pulse 84   Temp 99.2 F (37.3 C) (Oral)   Ht 5' 1 (1.549 m)   Wt 145 lb (65.8 kg)   BMI 27.40 kg/m   Wt Readings from Last 3 Encounters:  09/08/23 145 lb (65.8 kg)  08/27/23 141 lb (64 kg)  05/07/23 130 lb 3.2 oz (59.1 kg)    Physical Exam Vitals reviewed.  Constitutional:      General: She is not in acute distress.    Appearance: Normal appearance. She is normal weight. She is not ill-appearing, toxic-appearing or diaphoretic.  HENT:     Head: Normocephalic.  Neck:     Thyroid : Thyromegaly present. No thyroid  tenderness.  Cardiovascular:     Rate and  Rhythm: Normal rate and regular rhythm.  Pulmonary:     Effort: Pulmonary effort is normal.     Breath sounds: Normal breath sounds.  Musculoskeletal:        General: Normal range of motion.  Neurological:     General: No focal deficit present.     Mental Status: She is alert and oriented to person, place, and time. Mental status is at baseline.  Psychiatric:        Mood and Affect: Mood normal.        Behavior: Behavior normal.        Thought Content: Thought content normal.        Judgment: Judgment normal.           Assessment & Plan:  Goiter Assessment & Plan: Identified on exam. Ordering u/s thyroid .  Tsh wnl on the lower end of normal.   Orders: -     US  THYROID ; Future  History of cocaine use  Secondary hypertension Assessment & Plan: Unsure cause, currently under care with cardiology but needs to schedule a follow up appointment.  She states she will call today.  Advised pt to increase water intake daily goal 80 oz.  At this time advised to stop losartan , suspect allergic response.  Hesitant to add on agent for blood pressure at this time as when we added losartan  she had low lows. Discussed red flag symptoms to monitor for in regards to high blood pressure and or low blood pressure.      Pt taking multiple otc supplements. Advised her to d/c for now until we figure out her blood pressure.   Return in about 3 months (around 12/09/2023) for f/u CPE.  Ginger Patrick, MSN, APRN, FNP-C [facility] Medicine

## 2023-09-08 NOTE — Telephone Encounter (Signed)
 Pt started on the losartan  and had facial flushing and a red rash on her trunk. I advised her to stop the medication, she self decreased to 1/4 tablet but her blood pressure has been fluctuating from very high highs (170/110) and low lows (today in office 84/68). I did advise her to call your office to get in because I am unsure of next steps, she did set up the heart monitor and is wearing it currently.

## 2023-09-08 NOTE — Assessment & Plan Note (Signed)
 Unsure cause, currently under care with cardiology but needs to schedule a follow up appointment.  She states she will call today.  Advised pt to increase water intake daily goal 80 oz.  At this time advised to stop losartan , suspect allergic response.  Hesitant to add on agent for blood pressure at this time as when we added losartan  she had low lows. Discussed red flag symptoms to monitor for in regards to high blood pressure and or low blood pressure.

## 2023-09-09 NOTE — Telephone Encounter (Signed)
 Left voicemail to schedule echo & follow up appt.

## 2023-09-09 NOTE — Telephone Encounter (Signed)
 Thanks Dr. Mady.  Yes unfortunately her compliance factor is very intermittent but I did stress the importance of her calling to schedule a f/u appt with your office and she seemed on board. Her mom has been helping her with compliance a bit more lately so hopeful.

## 2023-09-14 ENCOUNTER — Other Ambulatory Visit: Payer: Self-pay

## 2023-09-14 MED ORDER — LO LOESTRIN FE 1 MG-10 MCG / 10 MCG PO TABS
1.0000 | ORAL_TABLET | Freq: Every day | ORAL | 3 refills | Status: AC
  Filled 2023-09-14: qty 90, 90d supply, fill #0
  Filled 2023-10-12: qty 84, 84d supply, fill #0
  Filled 2023-12-20: qty 84, 84d supply, fill #1
  Filled 2024-02-24: qty 84, 84d supply, fill #2

## 2023-09-14 MED ORDER — LO LOESTRIN FE 1 MG-10 MCG / 10 MCG PO TABS
1.0000 | ORAL_TABLET | Freq: Every day | ORAL | 3 refills | Status: DC
Start: 2023-10-05 — End: 2023-09-14
  Filled 2023-09-14: qty 90, 90d supply, fill #0

## 2023-09-14 NOTE — Addendum Note (Signed)
 Addended by: CORWIN ANTU on: 09/14/2023 03:57 PM   Modules accepted: Orders

## 2023-09-15 ENCOUNTER — Ambulatory Visit
Admission: RE | Admit: 2023-09-15 | Discharge: 2023-09-15 | Disposition: A | Discharge status: Home / Self Care | Payer: MEDICAID | Source: Ambulatory Visit | Attending: Family | Admitting: Family

## 2023-09-15 DIAGNOSIS — E049 Nontoxic goiter, unspecified: Secondary | ICD-10-CM | POA: Diagnosis present

## 2023-09-16 ENCOUNTER — Other Ambulatory Visit: Payer: Self-pay

## 2023-09-16 ENCOUNTER — Ambulatory Visit: Payer: Self-pay | Admitting: Family

## 2023-09-16 ENCOUNTER — Emergency Department: Payer: MEDICAID

## 2023-09-16 DIAGNOSIS — R944 Abnormal results of kidney function studies: Secondary | ICD-10-CM | POA: Diagnosis not present

## 2023-09-16 DIAGNOSIS — R519 Headache, unspecified: Secondary | ICD-10-CM | POA: Diagnosis present

## 2023-09-16 DIAGNOSIS — Z79899 Other long term (current) drug therapy: Secondary | ICD-10-CM | POA: Insufficient documentation

## 2023-09-16 DIAGNOSIS — N390 Urinary tract infection, site not specified: Secondary | ICD-10-CM | POA: Insufficient documentation

## 2023-09-16 DIAGNOSIS — I10 Essential (primary) hypertension: Secondary | ICD-10-CM | POA: Diagnosis not present

## 2023-09-16 DIAGNOSIS — M545 Low back pain, unspecified: Secondary | ICD-10-CM | POA: Insufficient documentation

## 2023-09-16 DIAGNOSIS — D72829 Elevated white blood cell count, unspecified: Secondary | ICD-10-CM | POA: Insufficient documentation

## 2023-09-16 DIAGNOSIS — E041 Nontoxic single thyroid nodule: Secondary | ICD-10-CM | POA: Insufficient documentation

## 2023-09-16 LAB — CBC
HCT: 45.9 % (ref 36.0–46.0)
Hemoglobin: 15.5 g/dL — ABNORMAL HIGH (ref 12.0–15.0)
MCH: 29.9 pg (ref 26.0–34.0)
MCHC: 33.8 g/dL (ref 30.0–36.0)
MCV: 88.6 fL (ref 80.0–100.0)
Platelets: 372 K/uL (ref 150–400)
RBC: 5.18 MIL/uL — ABNORMAL HIGH (ref 3.87–5.11)
RDW: 12.6 % (ref 11.5–15.5)
WBC: 10.9 K/uL — ABNORMAL HIGH (ref 4.0–10.5)
nRBC: 0 % (ref 0.0–0.2)

## 2023-09-16 NOTE — ED Triage Notes (Signed)
 Pt reports she has noticed her BP elevated today. Pt states she has hx of the same, pt was taken off BP med because she was allergic and her cardiologist is currently trying to find her another one that works well for her. Pt states she has some chest discomfort and a headache.

## 2023-09-17 ENCOUNTER — Emergency Department: Payer: MEDICAID

## 2023-09-17 ENCOUNTER — Other Ambulatory Visit (HOSPITAL_COMMUNITY): Payer: Self-pay

## 2023-09-17 ENCOUNTER — Emergency Department
Admission: EM | Admit: 2023-09-17 | Discharge: 2023-09-17 | Disposition: A | Discharge status: Home / Self Care | Payer: MEDICAID | Attending: Emergency Medicine | Admitting: Emergency Medicine

## 2023-09-17 DIAGNOSIS — N390 Urinary tract infection, site not specified: Secondary | ICD-10-CM

## 2023-09-17 DIAGNOSIS — R519 Headache, unspecified: Secondary | ICD-10-CM

## 2023-09-17 DIAGNOSIS — M545 Low back pain, unspecified: Secondary | ICD-10-CM

## 2023-09-17 DIAGNOSIS — R079 Chest pain, unspecified: Secondary | ICD-10-CM

## 2023-09-17 DIAGNOSIS — I10 Essential (primary) hypertension: Secondary | ICD-10-CM

## 2023-09-17 LAB — URINALYSIS, W/ REFLEX TO CULTURE (INFECTION SUSPECTED)
Bilirubin Urine: NEGATIVE
Glucose, UA: NEGATIVE mg/dL
Hgb urine dipstick: NEGATIVE
Ketones, ur: NEGATIVE mg/dL
Leukocytes,Ua: NEGATIVE
Nitrite: NEGATIVE
Protein, ur: NEGATIVE mg/dL
Specific Gravity, Urine: 1.015 (ref 1.005–1.030)
pH: 7 (ref 5.0–8.0)

## 2023-09-17 LAB — BASIC METABOLIC PANEL WITH GFR
Anion gap: 11 (ref 5–15)
BUN: 12 mg/dL (ref 6–20)
CO2: 29 mmol/L (ref 22–32)
Calcium: 9.9 mg/dL (ref 8.9–10.3)
Chloride: 99 mmol/L (ref 98–111)
Creatinine, Ser: 1.03 mg/dL — ABNORMAL HIGH (ref 0.44–1.00)
GFR, Estimated: >60 mL/min (ref >60)
Glucose, Bld: 95 mg/dL (ref 70–99)
Potassium: 3.8 mmol/L (ref 3.5–5.1)
Sodium: 139 mmol/L (ref 135–145)

## 2023-09-17 LAB — T4, FREE: Free T4: 0.8 ng/dL (ref 0.61–1.12)

## 2023-09-17 LAB — TROPONIN I (HIGH SENSITIVITY)
Troponin I (High Sensitivity): <2 ng/L (ref <18)
Troponin I (High Sensitivity): <2 ng/L (ref <18)

## 2023-09-17 LAB — HCG, QUANTITATIVE, PREGNANCY: hCG, Beta Chain, Quant, S: <1 m[IU]/mL (ref <5)

## 2023-09-17 LAB — D-DIMER, QUANTITATIVE: D-Dimer, Quant: <0.27 ug{FEU}/mL (ref 0.00–0.50)

## 2023-09-17 LAB — TSH: TSH: 2.286 u[IU]/mL (ref 0.350–4.500)

## 2023-09-17 LAB — MAGNESIUM: Magnesium: 2.4 mg/dL (ref 1.7–2.4)

## 2023-09-17 MED ORDER — HYDROMORPHONE HCL 1 MG/ML IJ SOLN
1.0000 mg | Freq: Once | INTRAMUSCULAR | Status: AC
  Administered 2023-09-17: 1 mg via INTRAVENOUS
  Filled 2023-09-17: qty 1

## 2023-09-17 MED ORDER — SODIUM CHLORIDE 0.9 % IV SOLN
2.0000 g | Freq: Once | INTRAVENOUS | Status: AC
  Administered 2023-09-17: 2 g via INTRAVENOUS
  Filled 2023-09-17: qty 20

## 2023-09-17 MED ORDER — LORAZEPAM 1 MG PO TABS
1.0000 mg | ORAL_TABLET | Freq: Once | ORAL | Status: AC
  Administered 2023-09-17: 1 mg via ORAL
  Filled 2023-09-17: qty 1

## 2023-09-17 MED ORDER — ONDANSETRON HCL 4 MG/2ML IJ SOLN
4.0000 mg | Freq: Once | INTRAMUSCULAR | Status: AC
  Administered 2023-09-17: 4 mg via INTRAVENOUS
  Filled 2023-09-17: qty 2

## 2023-09-17 MED ORDER — OXYCODONE HCL 5 MG PO TABS: 5.0000 mg | ORAL_TABLET | Freq: Three times a day (TID) | ORAL | 0 refills | Status: DC | PRN

## 2023-09-17 MED ORDER — SODIUM CHLORIDE 0.9 % IV BOLUS (SEPSIS)
1000.0000 mL | Freq: Once | INTRAVENOUS | Status: AC
  Administered 2023-09-17: 1000 mL via INTRAVENOUS

## 2023-09-17 MED ORDER — AMLODIPINE BESYLATE 5 MG PO TABS
5.0000 mg | ORAL_TABLET | Freq: Once | ORAL | Status: AC
  Administered 2023-09-17: 5 mg via ORAL
  Filled 2023-09-17: qty 1

## 2023-09-17 MED ORDER — CEPHALEXIN 500 MG PO CAPS: 500.0000 mg | ORAL_CAPSULE | Freq: Two times a day (BID) | ORAL | 0 refills | Status: DC

## 2023-09-17 MED ORDER — KETOROLAC TROMETHAMINE 30 MG/ML IJ SOLN
30.0000 mg | Freq: Once | INTRAMUSCULAR | Status: AC
  Administered 2023-09-17: 30 mg via INTRAVENOUS
  Filled 2023-09-17: qty 1

## 2023-09-17 MED ORDER — AMLODIPINE BESYLATE 5 MG PO TABS: 5.0000 mg | ORAL_TABLET | Freq: Every day | ORAL | 0 refills | Status: DC

## 2023-09-17 NOTE — Telephone Encounter (Signed)
 Copied from CRM #[id number]. Topic: Clinical - Prescription Issue >> Sep 17, 2023 12:50 PM Mercedes MATSU wrote: Reason for CRM: Patient is requesting a phone call due to pharmacy on file needing approval to release the patients oxyCODONE  (ROXICODONE ) 5 MG immediate release tablet. Patient can be reached at [phone].  Pharmacy: [id number] [address], [facility] - [address] [place] [address] Phone: [phone] Fax: [phone] Hours: Not open 24 hours

## 2023-09-17 NOTE — ED Provider Notes (Signed)
 [facility] Provider Note    Event Date/Time   First MD Initiated Contact with Patient 09/17/23 0207     (approximate)   History   Hypertension, Back Pain, Chest Pain, and Headache   HPI  Kelsey Ho is a 33 y.o. female with history of hypertension, anxiety, insomnia, kidney stones who presents to the emergency department with complaints of headache, chest tightness, low back pain, urinary frequency.  She is concerned because her blood pressure was elevated at home.  She was recently started on losartan  but could not tolerate it so had to stop it 4 days ago.  She did have a quarter dose several hours ago.  No history of PE or DVT.  No calf tenderness or calf swelling.  No numbness, tingling or weakness.  No injury to her back.  No dysuria or hematuria.  She has a Zio patch in place.  She has cardiology follow-up scheduled next week.   History provided by patient, family.    Past Medical History:  Diagnosis Date   Pinched nerve     Past Surgical History:  Procedure Laterality Date   COSMETIC SURGERY      MEDICATIONS:  Prior to Admission medications   Medication Sig Start Date End Date Taking? Authorizing Provider  amphetamine-dextroamphetamine (ADDERALL XR) 10 MG 24 hr capsule Take 10 mg by mouth 2 (two) times daily. Patient not taking: Reported on 09/08/2023    [provider]  amphetamine-dextroamphetamine (ADDERALL) 5 MG tablet Take 1 tablet by mouth daily. Patient not taking: Reported on 09/08/2023 03/24/23   [provider]  cyanocobalamin  (VITAMIN B12) 1000 MCG/ML injection INJECT 1 MILLILITER ONCE 30 DAYS    [provider]  LO LOESTRIN FE  1 MG-10 MCG / 10 MCG tablet Take 1 tablet by mouth daily. 12/15/23   Dugal, Tabitha, FNP  LORazepam  (ATIVAN ) 1 MG tablet Take 1 mg by mouth 3 (three) times daily. 12/19/21   [provider]  ondansetron  (ZOFRAN ) 8 MG tablet 1 tablet Orally every 8 hours 30 day(s) 03/09/23      tiZANidine  (ZANAFLEX ) 4 MG tablet Take 1 tablet (4 mg total) by mouth every 12 (twelve) hours as needed. 03/09/23     TRINTELLIX  20 MG TABS tablet Take 20 mg by mouth daily. 04/19/23   [provider]  zolpidem  (AMBIEN  CR) 12.5 MG CR tablet Take 1 tablet (12.5 mg total) by mouth at bedtime as needed. 03/09/23       Physical Exam   Triage Vital Signs: ED Triage Vitals  Encounter Vitals Group     BP 09/16/23 2323 (!) 157/121     Girls Systolic BP Percentile --      Girls Diastolic BP Percentile --      Boys Systolic BP Percentile --      Boys Diastolic BP Percentile --      Pulse Rate 09/16/23 2323 (!) 120     Resp 09/16/23 2323 20     Temp 09/16/23 2323 98.5 F (36.9 C)     Temp Source 09/16/23 2323 Oral     SpO2 09/16/23 2323 100 %     Weight 09/16/23 2322 140 lb (63.5 kg)     Height 09/16/23 2322 5' 1 (1.549 m)     Head Circumference --      Peak Flow --      Pain Score 09/16/23 2322 8     Pain Loc --      Pain Education --  Exclude from Growth Chart --     Most recent vital signs: Vitals:   09/17/23 0520 09/17/23 0540  BP: (!) 147/112 (!) 154/118  Pulse:  98  Resp:  18  Temp:    SpO2:  98%    CONSTITUTIONAL: Alert, responds appropriately to questions.  Appears anxious, appears uncomfortable HEAD: Normocephalic, atraumatic EYES: Conjunctivae clear, pupils appear equal, sclera nonicteric ENT: normal nose; moist mucous membranes NECK: Supple, normal ROM CARD: Regular and tachycardic, S1 and S2 appreciated RESP: Normal chest excursion without splinting or tachypnea; breath sounds clear and equal bilaterally; no wheezes, no rhonchi, no rales, no hypoxia or respiratory distress, speaking full sentences ABD/GI: Non-distended; soft, non-tender, no rebound, no guarding, no peritoneal signs BACK: The back appears normal, tender over the lower back, no CVA tenderness, no midline step-off or deformity EXT: Normal ROM in all joints; no deformity noted, no edema, no  calf tenderness or calf swelling SKIN: Normal color for age and race; warm; no rash on exposed skin NEURO: Moves all extremities equally, normal speech, no facial asymmetry, normal sensation PSYCH: The patient's mood and manner are appropriate.   ED Results / Procedures / Treatments   LABS: (all labs ordered are listed, but only abnormal results are displayed) Labs Reviewed  BASIC METABOLIC PANEL WITH GFR - Abnormal; Notable for the following components:      Result Value   Creatinine, Ser 1.03 (*)    All other components within normal limits  CBC - Abnormal; Notable for the following components:   WBC 10.9 (*)    RBC 5.18 (*)    Hemoglobin 15.5 (*)    All other components within normal limits  URINALYSIS, W/ REFLEX TO CULTURE (INFECTION SUSPECTED) - Abnormal; Notable for the following components:   Color, Urine YELLOW (*)    APPearance HAZY (*)    Bacteria, UA MANY (*)    All other components within normal limits  URINE CULTURE  D-DIMER, QUANTITATIVE  HCG, QUANTITATIVE, PREGNANCY  T4, FREE  MAGNESIUM  TSH  TROPONIN I (HIGH SENSITIVITY)  TROPONIN I (HIGH SENSITIVITY)     EKG:  EKG Interpretation Date/Time:  Wednesday September 16 2023 23:24:58 EDT Ventricular Rate:  110 PR Interval:  128 QRS Duration:  78 QT Interval:  314 QTC Calculation: 424 R Axis:   70  Text Interpretation: Sinus tachycardia Otherwise normal ECG When compared with ECG of 07-May-2023 10:07, Vent. rate has increased BY  39 BPM Confirmed by UNCONFIRMED, DOCTOR ([id number]), editor Alex Slough [id number]) on 09/17/2023 7:46:16 AM         RADIOLOGY: My personal review and interpretation of imaging: CT head unremarkable.  Chest x-ray clear.  CT renal study shows no kidney stones, pyelonephritis.  I have personally reviewed all radiology reports.   CT HEAD WO CONTRAST ( ) Result Date: 09/17/2023 CLINICAL DATA:  Elevated blood pressure. EXAM: CT HEAD WITHOUT CONTRAST TECHNIQUE: Contiguous axial images were  obtained from the base of the skull through the vertex without intravenous contrast. RADIATION DOSE REDUCTION: This exam was performed according to the departmental dose-optimization program which includes automated exposure control, adjustment of the mA and/or kV according to patient size and/or use of iterative reconstruction technique. COMPARISON:  July 31, 2023 FINDINGS: Brain: No evidence of acute infarction, hemorrhage, hydrocephalus, extra-axial collection or mass lesion/mass effect. Vascular: No hyperdense vessel or unexpected calcification. Skull: Normal. Negative for fracture or focal lesion. Sinuses/Orbits: A 12 mm left maxillary sinus polyp versus mucous retention cyst is noted. Other: None. IMPRESSION: 1.  No acute intracranial abnormality. 2. 12 mm left maxillary sinus polyp versus mucous retention cyst. Electronically Signed   By: Suzen Dials M.D.   On: 09/17/2023 03:37   CT Renal Stone Study Result Date: 09/17/2023 EXAM: CT ABDOMEN AND PELVIS WITHOUT CONTRAST 09/17/2023 03:27:27 AM TECHNIQUE: CT of the abdomen and pelvis was performed without the administration of intravenous contrast. Multiplanar reformatted images are provided for review. Automated exposure control, iterative reconstruction, and/or weight based adjustment of the mA/kV was utilized to reduce the radiation dose to as low as reasonably achievable. COMPARISON: 04/24/2023 CLINICAL HISTORY: Abdominal/flank pain, stone suspected. Pt reports she has noticed her BP elevated today. Pt states she has hx of the same, pt was taken off BP med because she was allergic and her cardiologist is currently trying to find her another one that works well for her. Pt states she has some chest discomfort and a headache. FINDINGS: LOWER CHEST: No acute abnormality. LIVER: The liver is unremarkable. GALLBLADDER AND BILE DUCTS: Gallbladder is unremarkable. No biliary ductal dilatation. SPLEEN: No acute abnormality. PANCREAS: No acute abnormality.  ADRENAL GLANDS: No acute abnormality. KIDNEYS, URETERS AND BLADDER: Multiple nonobstructing bilateral calculi measuring up to 6 mm in the left lower kidney (image 31). No hydronephrosis. No perinephric or periureteral stranding. Urinary bladder is unremarkable. GI AND BOWEL: Stomach demonstrates no acute abnormality. There is no bowel obstruction. No bowel wall thickening. Normal appendix (image 54). PERITONEUM AND RETROPERITONEUM: No ascites. No free air. VASCULATURE: Aorta is normal in caliber. LYMPH NODES: No lymphadenopathy. REPRODUCTIVE ORGANS: Uterus is within normal limits. BONES AND SOFT TISSUES: No acute osseous abnormality. No focal soft tissue abnormality. IMPRESSION: 1. Multiple nonobstructing bilateral renal calculi, measuring up to 6 mm on the left. No hydronephrosis. Electronically signed by: Pinkie Pebbles MD 09/17/2023 03:37 AM EDT RP Workstation: [id number]   DG Chest 1 View Result Date: 09/16/2023 EXAM: 1 VIEW XRAY OF THE CHEST 09/16/2023 11:44:01 PM COMPARISON: 10/24/2021 CLINICAL HISTORY: [id number] Chest pain [id number]. Table formatting from the original note was not included.; Images from the original note were not included.; Pt reports she has noticed her BP elevated today. Pt states she has hx of the same, pt was taken off BP med because she was allergic and her cardiologist is currently trying to find her another one that works well for her. Pt states she has some chest ; discomfort and a headache. Heart monitor in place FINDINGS: LUNGS AND PLEURA: No focal pulmonary opacity. No pulmonary edema. No pleural effusion. No pneumothorax. HEART AND MEDIASTINUM: No acute abnormality of the cardiac and mediastinal silhouettes. BONES AND SOFT TISSUES: No acute osseous abnormality. IMPRESSION: 1. No acute process. Electronically signed by: Pinkie Pebbles MD 09/16/2023 11:46 PM EDT RP Workstation: [id number]     PROCEDURES:  Critical Care performed: No   .1-3 Lead EKG  Interpretation  Performed by: Kineta Fudala, Josette SAILOR, DO Authorized by: Sidra Oldfield, Josette SAILOR, DO     Interpretation: normal     ECG rate:  98   ECG rate assessment: normal     Rhythm: sinus rhythm     Ectopy: none     Conduction: normal       IMPRESSION / MDM / ASSESSMENT AND PLAN / ED COURSE  I reviewed the triage vital signs and the nursing notes.    Patient here with complaints of headaches, chest tightness, low back pain, urinary frequency, hypertension.  The patient is on the cardiac monitor to evaluate for evidence of arrhythmia and/or significant heart rate changes.  DIFFERENTIAL DIAGNOSIS (includes but not limited to):   Hypertension, hypertensive urgency, hypertensive emergency, less likely ACS or dissection.  PE is on the differential.  Doubt pneumonia, pneumothorax, CHF.  Hypertensive headache also in the differential but lower suspicion for intracranial hemorrhage, doubt ruptured aneurysm, meningitis, CVA.  Differential also includes UTI, kidney stone, pyelonephritis, musculoskeletal back pain, muscle spasm, muscle strain.  Doubt cauda equina, epidural abscess or hematoma, discitis or osteomyelitis, transverse myelitis, critical spinal stenosis, back fracture.   Patient's presentation is most consistent with acute presentation with potential threat to life or bodily function.   PLAN: Labs show leukocytosis of 10.9 which may be reactive.  Creatinine of 1.03.  First troponin negative.  Second troponin, D-dimer pending as patient has been tachycardic.  Normal magnesium, potassium, TSH.  EKG shows no ischemia.  Chest x-ray clear.  Will also obtain urinalysis given complaints of urinary frequency and back pain.  She does have a history of kidney stones.  Will obtain CT scan to rule out infected kidney stone.  Will also obtain CT of her head given complaints of headache with hypertension to rule intracranial hemorrhage.  Will give dose of amlodipine  here for hypertension.  She did have 1  episode of hypotension documented but I feel like this may be erroneous given multiple other blood pressures have been elevated.  Will give pain medication and reassess.   MEDICATIONS GIVEN IN ED: Medications  ketorolac  (TORADOL ) 30 MG/ML injection 30 mg (30 mg Intravenous Given 09/17/23 0245)  sodium chloride  0.9 % bolus 1,000 mL (0 mLs Intravenous Stopped 09/17/23 0401)  ondansetron  (ZOFRAN ) injection 4 mg (4 mg Intravenous Given 09/17/23 0245)  HYDROmorphone  (DILAUDID ) injection 1 mg (1 mg Intravenous Given 09/17/23 0248)  cefTRIAXone  (ROCEPHIN ) 2 g in sodium chloride  0.9 % 100 mL IVPB (0 g Intravenous Stopped 09/17/23 0358)  LORazepam  (ATIVAN ) tablet 1 mg (1 mg Oral Given 09/17/23 0328)  amLODipine  (NORVASC ) tablet 5 mg (5 mg Oral Given 09/17/23 0356)  HYDROmorphone  (DILAUDID ) injection 1 mg (1 mg Intravenous Given 09/17/23 0356)     ED COURSE: Second troponin negative.  D-dimer negative.  CT head, CT renal study reviewed and interpreted by myself and the radiologist and are unremarkable.  Urine does appear infected.  Given urinary frequency and leukocytosis, will start her on antibiotics.  Rocephin  given here in the ED.  Patient reports feeling better.  She states pain has improved after pain medication, anxiety medication.  Her blood pressure on my evaluation is in the 130s/90s.  She has cardiology follow-up already scheduled.  Will discharge with prescription of amlodipine .  Will also discharge with antibiotics, pain medication.  Discussed return precautions.  Patient is comfortable with this plan.  At this time, I do not feel there is any life-threatening condition present. I reviewed all nursing notes, vitals, pertinent previous records.  All lab and urine results, EKGs, imaging ordered have been independently reviewed and interpreted by myself.  I reviewed all available radiology reports from any imaging ordered this visit.  Based on my assessment, I feel the patient is safe to be discharged  home without further emergent workup and can continue workup as an outpatient as needed. Discussed all findings, treatment plan as well as usual and customary return precautions.  They verbalize understanding and are comfortable with this plan.  Outpatient follow-up has been provided as needed.  All questions have been answered.    CONSULTS:  none   OUTSIDE RECORDS REVIEWED: Reviewed recent cardiology notes.  FINAL CLINICAL IMPRESSION(S) / ED DIAGNOSES   Final diagnoses:  Bad headache  Chest pain, unspecified type  Uncontrolled hypertension  Acute UTI  Acute exacerbation of chronic low back pain     Rx / DC Orders   ED Discharge Orders          Ordered    oxyCODONE  (ROXICODONE ) 5 MG immediate release tablet  Every 8 hours PRN        09/17/23 0452    cephALEXin  (KEFLEX ) 500 MG capsule  2 times daily        09/17/23 0452    amLODipine  (NORVASC ) 5 MG tablet  Daily        09/17/23 0452             Note:  This document was prepared using Dragon voice recognition software and may include unintentional dictation errors.   Ezmeralda Stefanick, Josette SAILOR, DO 09/17/23 0830

## 2023-09-17 NOTE — ED Notes (Signed)
 09/17/2023 220pm--[facility] regarding needing approval to fill the oxycodone .  It is not for insurance, it was because their med list says she is on 3 muscle relaxers.  I had gone over the med list with patient earlier, and she said she is not taking any muscle relaxers now.  Also says she has not been taking the adderal because she is worried about her blood pressure.  I explained this to the pharmacist.  He said he will also go over her med list with her before filling.

## 2023-09-17 NOTE — Telephone Encounter (Signed)
 Called [facility] to figure out what the issue is. Was left on hold for over 5 minutes. Will call back. MyChart message was also sent by the pt.

## 2023-09-17 NOTE — ED Notes (Signed)
Ward MD at bedside. 

## 2023-09-17 NOTE — ED Notes (Signed)
 Pt presented to ED with c/o high blood pressure. States she has been monitoring blood pressure since mother had a stroke. Pt states BP at home 179/110. States was on BP medication x 2 days but was taken off due to allergic reaction. States PCP wants to wait for pt to see cardiologist before placing pt on additional blood pressure medication. Pt endorses headache and lower back pain. Denies vision changes, chest pain, SOB. Pt A&Ox4. Pt provided with blanket. No other comfort measures requested at this time.

## 2023-09-17 NOTE — Discharge Instructions (Addendum)

## 2023-09-17 NOTE — ED Notes (Signed)
 MD aware pt requesting Ativan , pt states normally takes ativan  1mg  twice a day for anxiety.

## 2023-09-18 LAB — URINE CULTURE: Culture: NO GROWTH

## 2023-09-23 ENCOUNTER — Other Ambulatory Visit: Payer: Self-pay | Admitting: Student

## 2023-09-23 DIAGNOSIS — S82832D Other fracture of upper and lower end of left fibula, subsequent encounter for closed fracture with routine healing: Secondary | ICD-10-CM

## 2023-09-23 DIAGNOSIS — M958 Other specified acquired deformities of musculoskeletal system: Secondary | ICD-10-CM

## 2023-09-23 DIAGNOSIS — G8929 Other chronic pain: Secondary | ICD-10-CM

## 2023-09-23 DIAGNOSIS — S93492A Sprain of other ligament of left ankle, initial encounter: Secondary | ICD-10-CM

## 2023-09-24 ENCOUNTER — Ambulatory Visit: Payer: MEDICAID | Attending: Medical | Admitting: Medical

## 2023-09-24 ENCOUNTER — Encounter: Payer: Self-pay | Admitting: Medical

## 2023-09-24 VITALS — BP 90/60 | HR 72 | Ht 61.0 in | Wt 145.5 lb

## 2023-09-24 DIAGNOSIS — R0602 Shortness of breath: Secondary | ICD-10-CM | POA: Insufficient documentation

## 2023-09-24 DIAGNOSIS — R0989 Other specified symptoms and signs involving the circulatory and respiratory systems: Secondary | ICD-10-CM | POA: Insufficient documentation

## 2023-09-24 DIAGNOSIS — R000 Tachycardia, unspecified: Secondary | ICD-10-CM | POA: Diagnosis present

## 2023-09-24 DIAGNOSIS — R52 Pain, unspecified: Secondary | ICD-10-CM | POA: Insufficient documentation

## 2023-09-24 DIAGNOSIS — R002 Palpitations: Secondary | ICD-10-CM | POA: Diagnosis present

## 2023-09-24 MED ORDER — AMLODIPINE BESYLATE 5 MG PO TABS: 5.0000 mg | ORAL_TABLET | Freq: Two times a day (BID) | ORAL | 3 refills | Status: DC

## 2023-09-24 NOTE — Progress Notes (Unsigned)
 Cardiology Office Note   Date:  09/24/2023  ID:  Shondrika Hoque, DOB 1990/10/22, MRN [id number] PCP: Corwin Antu, FNP   HeartCare Providers Cardiologist:  None   History of Present Illness Kelsey Ho is a 33 y.o. female with a history of anxiety, ADHD, polysubstance abuse who is being seen for follow-up of palpitations, chest pain and elevated blood pressure.  Patient stopped using drugs about a year ago.  She is still using marijuana occasionally.  Patient was referred to Dr. Mady in March 2025 for palpitations, chest pain, elevated heart rate and elevated blood pressure.  Patient had previously been seen in the ER with abdominal pain.  CT of the abdomen and pelvis showed bilateral nonobstructing nephrolithiasis but no acute intra-abdominal process.  Heart rate and blood pressure were both elevated.  Patient reported palpitations coinciding with cessation of opioids and alprazolam .  EKG in the office showed normal sinus rhythm.  Heart monitor and echocardiogram were ordered.  Today, the patient reports she just mailed in the heart monitor. She took a tizanidine  and on oxycodone  this morning for her back pain and has no pain at this time. BP is low, however at home she shows that BP is high. She does not check BP at the same time every day.  It sounds like she has a long history of pain, possibly chronic pain or fibromyalgia.  She is going to get a back MRI and an ankle MRI. She denies chest pain, SOB. She has palpitations in the morning. No lower leg edema.   Studies Reviewed EKG Interpretation Date/Time:  Thursday September 24 2023 13:34:32 EDT Ventricular Rate:  72 PR Interval:  120 QRS Duration:  70 QT Interval:  394 QTC Calculation: 431 R Axis:   56  Text Interpretation: Normal sinus rhythm Normal ECG When compared with ECG of 16-Sep-2023 23:24, Vent. rate has decreased BY  38 BPM Confirmed by Franchester, Avamarie Crossley ([id number]) on 09/24/2023 1:36:49 PM     Physical Exam VS:  BP 90/60  (BP Location: Left Arm, Patient Position: Sitting, Cuff Size: Normal)   Pulse 72   Ht 5' 1 (1.549 m)   Wt 145 lb 8 oz (66 kg)   SpO2 98%   BMI 27.49 kg/m        Wt Readings from Last 3 Encounters:  09/24/23 145 lb 8 oz (66 kg)  09/16/23 140 lb (63.5 kg)  09/08/23 145 lb (65.8 kg)    GEN: Well nourished, well developed in no acute distress NECK: No JVD; No carotid bruits CARDIAC: RRR, no murmurs, rubs, gallops RESPIRATORY:  Clear to auscultation without rales, wheezing or rhonchi  ABDOMEN: Soft, non-tender, non-distended EXTREMITIES:  No edema; No deformity   ASSESSMENT AND PLAN  Labile HTN BP seems to be driven by pain. She has severe back pain and this causes BP to elevate. Once pain is improved, BP improves as well. BP is very labile for this reason. This AM she took a Tizanidne and Oxy for pain and BP is low 90/60. At home she says blood pressure is always high. She is taking amlodipine  5mg  daily in the AM. I recommended she take an extra 5 mg of amlodipine  a night if BP>150/100. Instructed her to check BP 2 hours after morning medications and one other time in the afternoon at the same time. We will see her back in a month. We will place referral for rheumatology and pain clinic.   Palpitations/tachycardia She reports palpitations in the morning. Stimulants such as  Adderall will worsen heart rate. We are awaiting heart monitor results.   Shortness of breath She denies significant SOB. No chest pain reported. We will schedule echocardiogram.      Dispo: Follow-up in 1 month  Signed, Linell Shawn VEAR Fishman, PA-C

## 2023-09-24 NOTE — Patient Instructions (Signed)
 Medication Instructions:  Your physician recommends the following medication changes.  START TAKING: Amlodipine  5 mg by mouth twice a day. Only take 2nd dose if blood pressure is greater than 150/100   *If you need a refill on your cardiac medications before your next appointment, please call your pharmacy*  Lab Work: No labs ordered today    Testing/Procedures: Please schedule previously ordered ECHO  Follow-Up: At [facility], you and your health needs are our priority.  As part of our continuing mission to provide you with exceptional heart care, our providers are all part of one team.  This team includes your primary Cardiologist (physician) and Advanced Practice Providers or APPs (Physician Assistants and Nurse Practitioners) who all work together to provide you with the care you need, when you need it.  Your next appointment:   1 month(s)  Provider:   Mikey Fishman, PA-C

## 2023-09-25 ENCOUNTER — Ambulatory Visit
Admission: RE | Admit: 2023-09-25 | Discharge: 2023-09-25 | Disposition: A | Discharge status: Home / Self Care | Payer: MEDICAID | Source: Ambulatory Visit | Attending: Student | Admitting: Student

## 2023-09-25 DIAGNOSIS — S82832D Other fracture of upper and lower end of left fibula, subsequent encounter for closed fracture with routine healing: Secondary | ICD-10-CM | POA: Diagnosis present

## 2023-09-25 DIAGNOSIS — S93492A Sprain of other ligament of left ankle, initial encounter: Secondary | ICD-10-CM | POA: Insufficient documentation

## 2023-09-25 DIAGNOSIS — M958 Other specified acquired deformities of musculoskeletal system: Secondary | ICD-10-CM | POA: Diagnosis present

## 2023-09-25 DIAGNOSIS — G8929 Other chronic pain: Secondary | ICD-10-CM | POA: Diagnosis present

## 2023-09-25 DIAGNOSIS — M25572 Pain in left ankle and joints of left foot: Secondary | ICD-10-CM | POA: Diagnosis present

## 2023-09-30 ENCOUNTER — Ambulatory Visit: Payer: Self-pay

## 2023-09-30 NOTE — Telephone Encounter (Signed)
 FYI Only or Action Required?: Action required by provider: clinical question for provider and update on patient condition.  Patient was last seen in primary care on 09/08/2023 by Corwin Antu, FNP.  Called Nurse Triage reporting Hypotension.  Symptoms began today.  Interventions attempted: Prescription medications: new change to BP meds .  Symptoms are: unchanged.  Triage Disposition: Go to ED Now (Notify PCP)  Patient/caregiver understands and will follow disposition?: Yes    Copied from CRM [id number]. Topic: Clinical - Red Word Triage >> Sep 30, 2023  1:23 PM Kelsey Ho wrote: Red Word that prompted transfer to Nurse Triage: light headed, losing control of legs and also had a uti Reason for Disposition  [1] Systolic BP < 80 AND [2] NOT feeling weak or lightheaded  Answer Assessment - Initial Assessment Questions 1. BLOOD PRESSURE: What is your blood pressure? Did you take at least two measurements 5 minutes apart?     70/40, one hour ago, 71/46, pulse 75 at time of call 3. HOW: How did you take your blood pressure? (e.g., visiting nurse, automatic home BP monitor)     Automatic monitor 4. HISTORY: Do you have a history of low blood pressure? What is your blood pressure normally?     positive 5. MEDICINES: Are you taking any medicines for blood pressure? If Yes, ask: Have they been changed recently?     ER NP recently changed BP medication, 09/17/2023 7. OTHER SYMPTOMS: Have you been sick recently? Have you had a recent injury?     UTI, treated at [facility] at ED, dizzy/light headed 8. PREGNANCY: Is there any chance you are pregnant? When was your last menstrual period?     N/A  Protocols used: Blood Pressure - Low-A-AH

## 2023-10-01 NOTE — Telephone Encounter (Signed)
 NOTED

## 2023-10-09 ENCOUNTER — Ambulatory Visit: Payer: MEDICAID | Attending: Medical | Admitting: Medical

## 2023-10-09 ENCOUNTER — Encounter: Payer: Self-pay | Admitting: Medical

## 2023-10-09 VITALS — BP 92/58 | HR 87 | Ht 61.0 in | Wt 148.6 lb

## 2023-10-09 DIAGNOSIS — R0989 Other specified symptoms and signs involving the circulatory and respiratory systems: Secondary | ICD-10-CM | POA: Diagnosis present

## 2023-10-09 DIAGNOSIS — R0602 Shortness of breath: Secondary | ICD-10-CM | POA: Insufficient documentation

## 2023-10-09 DIAGNOSIS — R52 Pain, unspecified: Secondary | ICD-10-CM | POA: Insufficient documentation

## 2023-10-09 DIAGNOSIS — R002 Palpitations: Secondary | ICD-10-CM | POA: Diagnosis present

## 2023-10-09 NOTE — Patient Instructions (Addendum)
 Medication Instructions:  Your physician recommends the following medication changes.  STOP TAKING: Amlodipine   *If you need a refill on your cardiac medications before your next appointment, please call your pharmacy*  Lab Work: None ordered at this time   Follow-Up: At [facility], you and your health needs are our priority.  As part of our continuing mission to provide you with exceptional heart care, our providers are all part of one team.  This team includes your primary Cardiologist (physician) and Advanced Practice Providers or APPs (Physician Assistants and Nurse Practitioners) who all work together to provide you with the care you need, when you need it.  Your next appointment:   2 month(s)  Provider:   You may see Lonni Hanson, MD or Cadence Franchester, [place]   Referral to Pain/Ortho clinic: Dr Kiki Blocker [facility] [address] [place], [place] [address]  Contact Phone: [phone] Fax: [phone]

## 2023-10-09 NOTE — Progress Notes (Signed)
 Cardiology Office Note   Date:  10/09/2023  ID:  Kelsey Ho, DOB 1990-08-05, MRN [id number] PCP: Corwin Antu, FNP  [facility] Providers Cardiologist:  Lonni Hanson, MD   History of Present Illness Kelsey Ho is a 33 y.o. female with a history of anxiety, ADHD, polysubstance abuse, chronic pain, h/o drug use and opioid overdose who is being seen for follow-up of palpitations, chest pain and elevated blood pressure.   Patient stopped using drugs about a year ago.  She is still using marijuana occasionally.   Patient was referred to Dr. Hanson in March 2025 for palpitations, chest pain, elevated heart rate and elevated blood pressure.  Patient had previously been seen in the ER with abdominal pain.  CT of the abdomen and pelvis showed bilateral nonobstructing nephrolithiasis but no acute intra-abdominal process.  Heart rate and blood pressure were both elevated.  Patient reported palpitations coinciding with cessation of opioids and alprazolam.  EKG in the office showed normal sinus rhythm.  Heart monitor and echocardiogram were ordered. Heart monitor showed NSR with rare ectopy.   She was see in follow-up 09/24/23 for elevated blood pressure in the ER and at home, but she was hypotensive at the visit. It was recommended she take amlodipine in the AM and only in the PM if BP was elevated.   Today, she reports low blood pressures at home. BP today is 92/58 on repeat check.  She has been taking amlodipine 5mg  in the AM. She has not been taking lorazepam or Adderall. She has tramadol to take for pain, but has not taken it today. She feels weak with low blood pressures. No chest pain or SOB. She had 1 episode of palpitations last week. She is requesting a referral for Dr. Kiki Blocker pain management. We are awaiting echo result.   Studies Reviewed     Heart monitor 08/2023 Patch Wear Time:  12 days and 5 hours (2025-07-11T18:53:39-0400 to 2025-07-24T00:23:53-0400)   Patient had a  min HR of 45 bpm, max HR of 170 bpm, and avg HR of 88 bpm. Predominant underlying rhythm was Sinus Rhythm. Isolated SVEs were rare (<1.0%), and no SVE Couplets or SVE Triplets were present. Isolated VEs were rare (<1.0%), and no VE Couplets  or VE Triplets were present.   Physical Exam VS:  BP (!) 92/58   Pulse 87   Ht 5' 1 (1.549 m)   Wt 148 lb 9.6 oz (67.4 kg)   SpO2 99%   BMI 28.08 kg/m        Wt Readings from Last 3 Encounters:  10/09/23 148 lb 9.6 oz (67.4 kg)  09/24/23 145 lb 8 oz (66 kg)  09/16/23 140 lb (63.5 kg)    GEN: Well nourished, well developed in no acute distress NECK: No JVD; No carotid bruits CARDIAC: RRR, no murmurs, rubs, gallops RESPIRATORY:  Clear to auscultation without rales, wheezing or rhonchi  ABDOMEN: Soft, non-tender, non-distended EXTREMITIES:  No edema; No deformity   ASSESSMENT AND PLAN  Labile HTN It seems blood pressures are directly related to her back/foot pain. When pain is well controlled, blood pressures are normal/low. If pain is uncontrolled then her pressures are very high. She reports she has not been taking Adderall or Lorazepam. She has tramadol to take, but has not been taking it frequently. She reports persistently low blood pressures at home. She has been taking amlodipine 5mg  daily. Today she is hypotensive and feeling weak. I recommended she stop the amlodipine and monitor BP  over the next week. Says her back and foot pain is controlled when she is able to have narcotics, which may be sporadic, explaining her labile blood pressures. I Asked her to call in with her pressures in 1 week.   Palpitations Heart monitor showed NSR with rare ectopy. She had one episode of palpitations last week, but overall they are very infrequent.   SOB She is scheduled for an echo later this month. She is euvolemic on exam.   Chronic pain Patient has chronic pain in her back and left ankle. She is requesting referral to Dr. Calvin Blocker for pain  management.     Dispo: Follow-up in 2 months  Signed, Juliene Kirsh VEAR Fishman, PA-C

## 2023-10-12 ENCOUNTER — Other Ambulatory Visit: Payer: Self-pay

## 2023-10-14 ENCOUNTER — Ambulatory Visit: Payer: Self-pay | Admitting: Internal Medicine

## 2023-10-14 ENCOUNTER — Ambulatory Visit: Payer: MEDICAID | Attending: Internal Medicine

## 2023-10-14 DIAGNOSIS — R0602 Shortness of breath: Secondary | ICD-10-CM | POA: Insufficient documentation

## 2023-10-14 DIAGNOSIS — R079 Chest pain, unspecified: Secondary | ICD-10-CM | POA: Diagnosis not present

## 2023-10-14 DIAGNOSIS — R002 Palpitations: Secondary | ICD-10-CM | POA: Insufficient documentation

## 2023-10-14 LAB — ECHOCARDIOGRAM COMPLETE
AR max vel: 2.65 cm2
AV Area VTI: 2.81 cm2
AV Area mean vel: 2.69 cm2
AV Mean grad: 3 mmHg
AV Peak grad: 5.9 mmHg
Ao pk vel: 1.21 m/s
Area-P 1/2: 3.5 cm2
S' Lateral: 2.5 cm

## 2023-10-28 ENCOUNTER — Other Ambulatory Visit: Payer: Self-pay

## 2023-11-06 ENCOUNTER — Ambulatory Visit: Payer: MEDICAID | Admitting: Medical

## 2023-11-19 ENCOUNTER — Other Ambulatory Visit: Payer: Self-pay

## 2023-12-01 ENCOUNTER — Encounter: Payer: Self-pay | Admitting: Family

## 2023-12-01 ENCOUNTER — Telehealth: Payer: Self-pay | Admitting: Family

## 2023-12-01 ENCOUNTER — Encounter: Payer: Self-pay | Admitting: *Deleted

## 2023-12-01 ENCOUNTER — Telehealth: Payer: Self-pay | Admitting: *Deleted

## 2023-12-01 ENCOUNTER — Ambulatory Visit: Payer: MEDICAID | Admitting: Family

## 2023-12-01 VITALS — BP 134/74 | HR 118 | Temp 98.3°F | Ht 61.0 in | Wt 148.6 lb

## 2023-12-01 DIAGNOSIS — R0989 Other specified symptoms and signs involving the circulatory and respiratory systems: Secondary | ICD-10-CM

## 2023-12-01 DIAGNOSIS — E782 Mixed hyperlipidemia: Secondary | ICD-10-CM | POA: Diagnosis not present

## 2023-12-01 DIAGNOSIS — Z6828 Body mass index (BMI) 28.0-28.9, adult: Secondary | ICD-10-CM

## 2023-12-01 DIAGNOSIS — I159 Secondary hypertension, unspecified: Secondary | ICD-10-CM

## 2023-12-01 DIAGNOSIS — E663 Overweight: Secondary | ICD-10-CM | POA: Diagnosis not present

## 2023-12-01 DIAGNOSIS — R232 Flushing: Secondary | ICD-10-CM

## 2023-12-01 DIAGNOSIS — R350 Frequency of micturition: Secondary | ICD-10-CM

## 2023-12-01 DIAGNOSIS — R21 Rash and other nonspecific skin eruption: Secondary | ICD-10-CM

## 2023-12-01 DIAGNOSIS — E538 Deficiency of other specified B group vitamins: Secondary | ICD-10-CM

## 2023-12-01 DIAGNOSIS — M2550 Pain in unspecified joint: Secondary | ICD-10-CM

## 2023-12-01 DIAGNOSIS — R233 Spontaneous ecchymoses: Secondary | ICD-10-CM | POA: Diagnosis not present

## 2023-12-01 DIAGNOSIS — R000 Tachycardia, unspecified: Secondary | ICD-10-CM

## 2023-12-01 DIAGNOSIS — D582 Other hemoglobinopathies: Secondary | ICD-10-CM

## 2023-12-01 DIAGNOSIS — F419 Anxiety disorder, unspecified: Secondary | ICD-10-CM

## 2023-12-01 DIAGNOSIS — G43019 Migraine without aura, intractable, without status migrainosus: Secondary | ICD-10-CM

## 2023-12-01 DIAGNOSIS — E559 Vitamin D deficiency, unspecified: Secondary | ICD-10-CM | POA: Diagnosis not present

## 2023-12-01 DIAGNOSIS — Q828 Other specified congenital malformations of skin: Secondary | ICD-10-CM

## 2023-12-01 DIAGNOSIS — L749 Eccrine sweat disorder, unspecified: Secondary | ICD-10-CM

## 2023-12-01 DIAGNOSIS — F411 Generalized anxiety disorder: Secondary | ICD-10-CM

## 2023-12-01 LAB — TSH: TSH: 1.05 u[IU]/mL (ref 0.35–5.50)

## 2023-12-01 LAB — VITAMIN B12: Vitamin B-12: 813 pg/mL (ref 211–911)

## 2023-12-01 LAB — CBC
HCT: 45.3 % (ref 36.0–46.0)
Hemoglobin: 14.9 g/dL (ref 12.0–15.0)
MCHC: 32.8 g/dL (ref 30.0–36.0)
MCV: 88.2 fl (ref 78.0–100.0)
Platelets: 456 K/uL — ABNORMAL HIGH (ref 150.0–400.0)
RBC: 5.13 Mil/uL — ABNORMAL HIGH (ref 3.87–5.11)
RDW: 13.2 % (ref 11.5–15.5)
WBC: 11.2 K/uL — ABNORMAL HIGH (ref 4.0–10.5)

## 2023-12-01 LAB — C-REACTIVE PROTEIN: CRP: 1.2 mg/dL (ref 0.5–20.0)

## 2023-12-01 LAB — HEMOGLOBIN A1C: Hgb A1c MFr Bld: 5.6 % (ref 4.6–6.5)

## 2023-12-01 LAB — VITAMIN D 25 HYDROXY (VIT D DEFICIENCY, FRACTURES): VITD: 14.64 ng/mL — ABNORMAL LOW (ref 30.00–100.00)

## 2023-12-01 LAB — SEDIMENTATION RATE: Sed Rate: 29 mm/h — ABNORMAL HIGH (ref 0–20)

## 2023-12-01 LAB — PROTIME-INR
INR: 1 ratio (ref 0.8–1.0)
Prothrombin Time: 11.1 s (ref 9.6–13.1)

## 2023-12-01 MED ORDER — UBRELVY 50 MG PO TABS: 50.0000 mg | ORAL_TABLET | Freq: Every day | ORAL | Status: AC | PRN

## 2023-12-01 NOTE — Telephone Encounter (Signed)
 So I think I incidentally sent you a message as well as Dr. Mady with the same Q and he noticed the pt has a f/u with you guys x one week so I have mentioned to the pt to come in with AHBPM to your f/u as he requested and from there that may be more helpful. 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 She does still take adderall prn but hasn't in the last week or so.

## 2023-12-01 NOTE — Telephone Encounter (Signed)
Thank you, I will reach out to her.

## 2023-12-01 NOTE — Progress Notes (Signed)
 Established Patient Office Visit  Subjective:      CC:  Chief Complaint  Patient presents with   Medical Management of Chronic Issues    Would like to have labs done.    HPI: Kelsey Ho is a 33 y.o. female presenting on 12/01/2023 for Medical Management of Chronic Issues (Would like to have labs done.)   Discussed the use of AI scribe software for clinical note transcription with the patient, who gave verbal consent to proceed.  History of Present Illness Kelsey Ho is a 33 year old female who presents with concerns about weight management and medication side effects.  She is seeking information on weight loss medications covered by her insurance. She previously used Mounjaro  but discontinued it, leading to weight gain. Her BMI is currently around 27.8 to 28.1.  She has a history of hypertension with home blood pressure readings typically showing a systolic range of 145 to 165 and diastolic consistently over 100. She was previously on amlodipine  but discontinued it due to side effects including ankle swelling and pain. She has tried various medications, including losartan , which caused a rash, and nortriptyline, which helped with sleep and back pain but is not intended for blood pressure management. She is sensitive to many medications and is currently on Trintellix  and Adderall, which she uses cautiously due to its impact on heart rate.  She has a history of substance use, including cocaine and fentanyl, but has been clean since November of last year. She experiences pain and stiffness, particularly in her abdomen and back, and has been using an Epogen machine for her ankle, which did not heal properly after a fracture.  She reports frequent urination and bruising, which she attributes to increased water intake and recent injections. She has a history of UTIs and is concerned about easy bruising, particularly after Kybella injections. She is taking coconut oil, B12, and biotin  supplements. She experiences flushing and redness, particularly in her face, ears, knees, and ankles, and has been using Ubrelvy for morning headaches prn which have been helpful.   She is not currently seeing a therapist but is open to the idea, having had difficulty finding availability with previous referrals.  Wt Readings from Last 3 Encounters:  12/01/23 148 lb 9.6 oz (67.4 kg)  10/09/23 148 lb 9.6 oz (67.4 kg)  09/24/23 145 lb 8 oz (66 kg)           Social history:  Relevant past medical, surgical, family and social history reviewed and updated as indicated. Interim medical history since our last visit reviewed.  Allergies and medications reviewed and updated.  DATA REVIEWED: CHART IN EPIC     ROS: Negative unless specifically indicated above in HPI.    Current Outpatient Medications:    amphetamine-dextroamphetamine (ADDERALL) 5 MG tablet, Take 1 tablet by mouth daily., Disp: , Rfl:    cyanocobalamin  (VITAMIN B12) 1000 MCG/ML injection, INJECT 1 MILLILITER ONCE 30 DAYS, Disp: , Rfl:    [START ON 12/15/2023] LO LOESTRIN FE  1 MG-10 MCG / 10 MCG tablet, Take 1 tablet by mouth daily., Disp: 84 tablet, Rfl: 3   LORazepam  (ATIVAN ) 1 MG tablet, Take 1 mg by mouth 3 (three) times daily., Disp: , Rfl:    ondansetron  (ZOFRAN ) 8 MG tablet, 1 tablet Orally every 8 hours 30 day(s), Disp: 90 tablet, Rfl: 2   tiZANidine  (ZANAFLEX ) 4 MG tablet, Take 1 tablet (4 mg total) by mouth every 12 (twelve) hours as needed., Disp: 60 tablet, Rfl:  2   TRINTELLIX  20 MG TABS tablet, Take 20 mg by mouth daily., Disp: , Rfl:    Ubrogepant (UBRELVY) 50 MG TABS, Take 1 tablet (50 mg total) by mouth daily as needed., Disp: , Rfl:    zolpidem  (AMBIEN  CR) 12.5 MG CR tablet, Take 1 tablet (12.5 mg total) by mouth at bedtime as needed., Disp: 30 tablet, Rfl: 0        Objective:        BP 134/74 (BP Location: Left Arm, Patient Position: Sitting, Cuff Size: Normal)   Pulse (!) 118   Temp 98.3 F  (36.8 C) (Temporal)   Ht 5' 1 (1.549 m)   Wt 148 lb 9.6 oz (67.4 kg)   SpO2 100%   BMI 28.08 kg/m   Physical Exam VITALS: BP- 134/74 MEASUREMENTS: BMI- 28.1.  Wt Readings from Last 3 Encounters:  12/01/23 148 lb 9.6 oz (67.4 kg)  10/09/23 148 lb 9.6 oz (67.4 kg)  09/24/23 145 lb 8 oz (66 kg)    Physical Exam Vitals reviewed.  Constitutional:      General: She is not in acute distress.    Appearance: Normal appearance. She is normal weight. She is not ill-appearing, toxic-appearing or diaphoretic.  HENT:     Head: Normocephalic.  Neck:     Thyroid : No thyromegaly.  Cardiovascular:     Rate and Rhythm: Tachycardia present.  Pulmonary:     Effort: Pulmonary effort is normal.  Musculoskeletal:        General: Normal range of motion.  Neurological:     General: No focal deficit present.     Mental Status: She is alert and oriented to person, place, and time. Mental status is at baseline.  Psychiatric:        Mood and Affect: Mood normal.        Behavior: Behavior normal.        Thought Content: Thought content normal.        Judgment: Judgment normal.          Results DIAGNOSTIC Echocardiogram: Underlying rhythm sinus, very few ventricular arrhythmias or triplets, no supraventricular arrhythmias, isolated supraventricular arrhythmias rare Heart rate monitor: Minimum heart rate 45 bpm, maximum heart rate 178 bpm, average heart rate 88 bpm  Assessment & Plan:   Assessment and Plan Assessment & Plan Obesity with secondary hypertension and hypercholesterolemia Experiencing weight gain after discontinuing Mounjaro . BMI is 27.8 to 28.1, potentially qualifying her for weight loss medications if insurance covers it. Secondary hypertension and hypercholesterolemia support the need for weight management. Current blood pressure is 134/74, but she reports higher readings at home, with diastolic pressure often over 100. Experienced side effects from previous antihypertensive  medications, including edema and rash. Concern about labile hypertension possibly induced by pain or previous substance use. Off cocaine for nearly a year. Insurance may cover [phone] or Zepbound  for weight loss, but not Mounjaro  or Ozempic due to non-diabetic status. - Call insurance to check coverage for weight loss medications such as Wegovy or Zepbound . -The beneficiary does not have any FDA labeled contraindications to the requested agent including pregnancy, lactation, h/o medullary thyroid  cancer or multiple endocrine neoplasia type II.   - Send prior authorization for weight loss medication if insurance approves. - Monitor blood pressure at home and report readings to cardiologist. - Consider diuretic such as hydrochlorothiazide pending cardiologist approval. - Send message to cardiologist to discuss alternative antihypertensive options.  Chronic pain syndrome with fibromyalgia Reports chronic pain, stiffness, and achiness,  particularly in the abdomen and joints. Currently on Trintellix . Previous medications like duloxetine caused side effects. Sensitive to many medications and seeking non-pharmacological pain management options like trigger point injections, which are currently on hold due to high blood pressure. Pain may be exacerbated by fibromyalgia, activating chronic pain synapses. - Order labs to evaluate for potential causes of pain and stiffness. - Refer to therapist for potential fibromyalgia management and support. - Discuss with cardiologist the possibility of starting a diuretic to manage blood pressure and enable pain management interventions.  Migraine Experiences morning headaches and is currently prescribed Ubrelvy for migraine management. Reports that the medication is effective, but limited to ten doses per prescription. - Continue [place] as needed for migraine management.  Rosacea or facial flushing (under evaluation) Reports frequent facial flushing, redness, and  warmth, extending to ears, knees, and ankles. Denies taking any new medications that could cause flushing. Considering pheochromocytoma due to symptoms of elevated heart rate, heightened anxiety state, labile hypertension, facial flushing. - Order urine test to evaluate for pheochromocytoma.  Polyuria (under evaluation) Reports increased urination over the past few months, attributed to increased water intake. History of a UTI, treated with antibiotics. - Order urinalysis to evaluate for ongoing urinary issues.  Easy bruising (under evaluation) Reports increased bruising, particularly in areas where she receives injections. Concerned about potential vitamin deficiencies or clotting issues. - Order labs to check platelet count and evaluate for clotting disorders. - Check vitamin levels, including vitamin D and B12.  General Health Maintenance Making lifestyle changes, including increased water intake and walking for exercise. Not currently seeing a therapist but is open to it. - Refer to therapist for mental health support and management of chronic conditions.        Return in about 3 months (around 03/02/2024) for f/u CPE.     Kelsey Patrick, MSN, APRN, FNP-C [facility]

## 2023-12-01 NOTE — Patient Outreach (Signed)
 Phone call from patient inquiring if her health plan covers [facility]  or Zepbound  . This social worker encouraged her to call her health plan. Customer Service number  provided. [phone]. Patient also encouraged to call with any additional questions or concerns.  Delayla Hoffmaster, LCSW [place]  [facility], [facility] Licensed Clinical Social Worker  Direct Dial: [phone]

## 2023-12-01 NOTE — Patient Instructions (Signed)
 SABRA

## 2023-12-01 NOTE — Telephone Encounter (Signed)
 Pt doing AHBPM with averages per her report 160/170 with diastolic she states always over 100. Pulse rate still consistently elevated  She states off of cocaine x 1 year in November  She did not take adderall today   Could we maybe consider diuretic?

## 2023-12-02 LAB — RHEUMATOID FACTOR: Rheumatoid fact SerPl-aCnc: <10 [IU]/mL (ref <14)

## 2023-12-03 ENCOUNTER — Ambulatory Visit: Payer: Self-pay | Admitting: Family

## 2023-12-03 ENCOUNTER — Other Ambulatory Visit: Payer: Self-pay

## 2023-12-03 DIAGNOSIS — E559 Vitamin D deficiency, unspecified: Secondary | ICD-10-CM

## 2023-12-03 LAB — ANA W/REFLEX: ANA Titer 1: NEGATIVE

## 2023-12-03 MED ORDER — CHOLECALCIFEROL 1.25 MG (50000 UT) PO CAPS
50000.0000 [IU] | ORAL_CAPSULE | ORAL | 0 refills | Status: AC
  Filled 2023-12-03: qty 12, 84d supply, fill #0

## 2023-12-03 MED ORDER — WEGOVY 0.25 MG/0.5ML ~~LOC~~ SOAJ
SUBCUTANEOUS | 0 refills | Status: DC
  Filled 2023-12-03 (×2): qty 2, 28d supply, fill #0

## 2023-12-03 MED ORDER — WEGOVY 1 MG/0.5ML ~~LOC~~ SOAJ
1.0000 mg | SUBCUTANEOUS | 0 refills | Status: DC
  Filled 2023-12-03: qty 6, 84d supply, fill #0

## 2023-12-03 MED ORDER — WEGOVY 0.5 MG/0.5ML ~~LOC~~ SOAJ
SUBCUTANEOUS | 0 refills | Status: DC
  Filled 2023-12-03: qty 2, fill #0

## 2023-12-03 NOTE — Telephone Encounter (Signed)
 Can we start prior auth for wegovy/  BMI > 28  Hyperlipidemia and HTN

## 2023-12-04 ENCOUNTER — Other Ambulatory Visit (HOSPITAL_COMMUNITY): Payer: Self-pay

## 2023-12-04 ENCOUNTER — Other Ambulatory Visit: Payer: Self-pay

## 2023-12-04 ENCOUNTER — Telehealth: Payer: Self-pay

## 2023-12-04 MED ORDER — WEGOVY 0.25 MG/0.5ML ~~LOC~~ SOAJ
SUBCUTANEOUS | 0 refills | Status: DC
Start: 2023-12-04 — End: 2023-12-14

## 2023-12-04 MED ORDER — WEGOVY 1 MG/0.5ML ~~LOC~~ SOAJ: 1.0000 mg | SUBCUTANEOUS | 0 refills | Status: DC

## 2023-12-04 MED ORDER — WEGOVY 0.5 MG/0.5ML ~~LOC~~ SOAJ
SUBCUTANEOUS | 0 refills | Status: DC
Start: 2023-12-24 — End: 2023-12-14

## 2023-12-04 NOTE — Telephone Encounter (Signed)
 Ok sounds good, I have not ordered a sleep study.  The cardiology notes are in epic.  Thanks!

## 2023-12-04 NOTE — Telephone Encounter (Signed)

## 2023-12-07 NOTE — Telephone Encounter (Signed)
Great thank you so much :)

## 2023-12-09 ENCOUNTER — Ambulatory Visit: Payer: MEDICAID | Attending: Medical | Admitting: Medical

## 2023-12-09 ENCOUNTER — Encounter: Payer: Self-pay | Admitting: Medical

## 2023-12-09 VITALS — BP 130/90 | HR 92 | Ht 61.0 in | Wt 147.6 lb

## 2023-12-09 DIAGNOSIS — R000 Tachycardia, unspecified: Secondary | ICD-10-CM | POA: Insufficient documentation

## 2023-12-09 DIAGNOSIS — G8929 Other chronic pain: Secondary | ICD-10-CM | POA: Insufficient documentation

## 2023-12-09 DIAGNOSIS — R0989 Other specified symptoms and signs involving the circulatory and respiratory systems: Secondary | ICD-10-CM | POA: Diagnosis present

## 2023-12-09 MED ORDER — METOPROLOL SUCCINATE ER 25 MG PO TB24: 12.5000 mg | ORAL_TABLET | Freq: Every day | ORAL | 3 refills | Status: DC

## 2023-12-09 NOTE — Progress Notes (Unsigned)
 Cardiology Office Note   Date:  12/10/2023  ID:  Kelsey Ho, DOB [birth year], MRN [id number] PCP: Corwin Antu, FNP  [facility] Providers Cardiologist:  Lonni Hanson, MD     History of Present Illness Kelsey Ho is a 33 y.o. female  with a history of anxiety, ADHD, polysubstance abuse, chronic pain, h/o drug use and opioid overdose who is being seen for follow-up of palpitations, chest pain and elevated blood pressure.   Patient stopped using drugs about a year ago.  She is still using marijuana occasionally.   Patient was referred to Dr. Hanson in March 2025 for palpitations, chest pain, elevated heart rate and elevated blood pressure.  Patient had previously been seen in the ER with abdominal pain.  CT of the abdomen and pelvis showed bilateral nonobstructing nephrolithiasis but no acute intra-abdominal process.  Heart rate and blood pressure were both elevated.  Patient reported palpitations coinciding with cessation of opioids and alprazolam .  EKG in the office showed normal sinus rhythm.  Heart monitor and echocardiogram were ordered. Heart monitor showed NSR with rare ectopy.    She was see in follow-up 09/24/23 for elevated blood pressure in the ER and at home, but she was hypotensive at the visit. It was recommended she take amlodipine  in the AM and only in the PM if BP was elevated.   She was last seen 10/09/23 reporting low blood pressures, she was not taking adderol or lorazepam . It was recommended to she hold amlodipine  and monitor BP.   Today, the patient reports occasional elevated heart rate. She went get steroid shots but heart rate was too high, so this was deferred. Notes reports HR 123bpm at one visit and 122bpm on the other visit. she now is off tizanindine and this may be affecting HR. She is taking Adderall 5mg . She is unsure if anxiety is contributing to elevated heart rate. She denies chest pain, SOB, lightheadedness, dizziness, lower leg edema. She is walking  more and trying to stay active. She underwent ankle injections as well with improvement of pain.  She is taking lorazepam  BID, Adderall 5mg  daily, zofran  as needed  Studies Reviewed      Echo 09/2023 1. Left ventricular ejection fraction, by estimation, is 60 to 65%. The  left ventricle has normal function. The left ventricle has no regional  wall motion abnormalities. Left ventricular diastolic parameters were  normal.   2. Right ventricular systolic function is normal. The right ventricular  size is normal.   3. The mitral valve is normal in structure. No evidence of mitral valve  regurgitation.   4. The aortic valve was not well visualized. Aortic valve regurgitation  is not visualized.   5. The inferior vena cava is normal in size with greater than 50%  respiratory variability, suggesting right atrial pressure of 3 mmHg.   Heart monitor 08/2023 Patch Wear Time:  12 days and 5 hours (2025-07-11T18:53:39-0400 to 2025-07-24T00:23:53-0400)   Patient had a min HR of 45 bpm, max HR of 170 bpm, and avg HR of 88 bpm. Predominant underlying rhythm was Sinus Rhythm. Isolated SVEs were rare (<1.0%), and no SVE Couplets or SVE Triplets were present. Isolated VEs were rare (<1.0%), and no VE Couplets  or VE Triplets were present.   Physical Exam VS:  BP (!) 130/90 (BP Location: Left Arm, Patient Position: Sitting, Cuff Size: Normal)   Pulse 92   Ht 5' 1 (1.549 m)   Wt 147 lb 9.6 oz (67 kg)  SpO2 97%   BMI 27.89 kg/m        Wt Readings from Last 3 Encounters:  12/09/23 147 lb 9.6 oz (67 kg)  12/01/23 148 lb 9.6 oz (67.4 kg)  10/09/23 148 lb 9.6 oz (67.4 kg)    GEN: Well nourished, well developed in no acute distress NECK: No JVD; No carotid bruits CARDIAC: RRR, no murmurs, rubs, gallops RESPIRATORY:  Clear to auscultation without rales, wheezing or rhonchi  ABDOMEN: Soft, non-tender, non-distended EXTREMITIES:  No edema; No deformity   ASSESSMENT AND PLAN  Elevated heart  rate Patient reported she went twice to get a back injection and heart rate was over 100, so injection was deferred ( visit on 9/22 heart rate 123bpm and 10/6 HR 122 ) . Prior heart monitor showed average HR 88bpm. HR today 92bpm. She recently came off tizanidine , she is back on Aderrall and is still taking lorazepam , which may be contributing. She has not used cocaine in over a year. I will start Toprol 12.5mg  daily.   Labile blood pressures Patient reports blood pressures have been well controlled. Start Toprol as above.   Chronic pain Patient reports ankle pain and back pain. She is seeing orthopaedics and undergoing steroid injections. This may be affecting heart rate and BP.     Dispo: Follow-up in 3 months  Signed, Lyanne Kates VEAR Fishman, PA-C

## 2023-12-09 NOTE — Patient Instructions (Signed)
 Medication Instructions:  Your physician recommends the following medication changes.  START TAKING: Metoprolol 12.5 mg by mouth daily    *If you need a refill on your cardiac medications before your next appointment, please call your pharmacy*  Lab Work: No labs ordered today    Testing/Procedures: No test ordered today   Follow-Up: At [facility], you and your health needs are our priority.  As part of our continuing mission to provide you with exceptional heart care, our providers are all part of one team.  This team includes your primary Cardiologist (physician) and Advanced Practice Providers or APPs (Physician Assistants and Nurse Practitioners) who all work together to provide you with the care you need, when you need it.  Your next appointment:   3 month(s)  Provider:   Lonni Hanson, MD or Cadence Franchester, PA-C

## 2023-12-14 MED ORDER — TIRZEPATIDE-WEIGHT MANAGEMENT 2.5 MG/0.5ML ~~LOC~~ SOLN: 2.5000 mg | SUBCUTANEOUS | 0 refills | Status: DC

## 2023-12-14 NOTE — Addendum Note (Signed)
 Addended by: CORWIN ANTU on: 12/14/2023 06:55 AM   Modules accepted: Orders

## 2023-12-16 ENCOUNTER — Other Ambulatory Visit: Payer: Self-pay

## 2023-12-16 DIAGNOSIS — L749 Eccrine sweat disorder, unspecified: Secondary | ICD-10-CM

## 2023-12-16 DIAGNOSIS — R232 Flushing: Secondary | ICD-10-CM

## 2023-12-17 MED ORDER — METRONIDAZOLE 0.75 % EX GEL: 1.0000 | Freq: Two times a day (BID) | CUTANEOUS | 0 refills | Status: AC

## 2023-12-17 NOTE — Addendum Note (Signed)
 Addended by: CORWIN ANTU on: 12/17/2023 04:35 PM   Modules accepted: Orders

## 2023-12-20 ENCOUNTER — Other Ambulatory Visit: Payer: Self-pay

## 2023-12-20 LAB — METANEPHRINES, URINE, 24 HOUR
METANEPHRINE: 79 ug/(24.h) (ref 36–190)
METANEPHRINES, TOTAL: 203 ug/(24.h) (ref 115–695)
NORMETANEPHRINE: 124 ug/(24.h) (ref 35–482)
Total Volume: 1100 mL

## 2023-12-21 ENCOUNTER — Emergency Department
Admission: EM | Admit: 2023-12-21 | Discharge: 2023-12-21 | Disposition: A | Discharge status: Home / Self Care | Payer: MEDICAID | Attending: Emergency Medicine | Admitting: Emergency Medicine

## 2023-12-21 ENCOUNTER — Other Ambulatory Visit: Payer: Self-pay

## 2023-12-21 DIAGNOSIS — G8929 Other chronic pain: Secondary | ICD-10-CM | POA: Insufficient documentation

## 2023-12-21 DIAGNOSIS — M545 Low back pain, unspecified: Secondary | ICD-10-CM | POA: Insufficient documentation

## 2023-12-21 LAB — COMPREHENSIVE METABOLIC PANEL WITH GFR
ALT: 12 U/L (ref 0–44)
AST: 15 U/L (ref 15–41)
Albumin: 4.3 g/dL (ref 3.5–5.0)
Alkaline Phosphatase: 59 U/L (ref 38–126)
Anion gap: 11 (ref 5–15)
BUN: 19 mg/dL (ref 6–20)
CO2: 27 mmol/L (ref 22–32)
Calcium: 10 mg/dL (ref 8.9–10.3)
Chloride: 100 mmol/L (ref 98–111)
Creatinine, Ser: 1.1 mg/dL — ABNORMAL HIGH (ref 0.44–1.00)
GFR, Estimated: >60 mL/min (ref >60)
Glucose, Bld: 88 mg/dL (ref 70–99)
Potassium: 4 mmol/L (ref 3.5–5.1)
Sodium: 138 mmol/L (ref 135–145)
Total Bilirubin: 1 mg/dL (ref 0.0–1.2)
Total Protein: 7.7 g/dL (ref 6.5–8.1)

## 2023-12-21 LAB — URINALYSIS, ROUTINE W REFLEX MICROSCOPIC
Bilirubin Urine: NEGATIVE
Glucose, UA: NEGATIVE mg/dL
Hgb urine dipstick: NEGATIVE
Ketones, ur: NEGATIVE mg/dL
Leukocytes,Ua: NEGATIVE
Nitrite: NEGATIVE
Protein, ur: NEGATIVE mg/dL
Specific Gravity, Urine: 1.005 (ref 1.005–1.030)
pH: 6 (ref 5.0–8.0)

## 2023-12-21 LAB — CBC
HCT: 45.4 % (ref 36.0–46.0)
Hemoglobin: 14.9 g/dL (ref 12.0–15.0)
MCH: 29.6 pg (ref 26.0–34.0)
MCHC: 32.8 g/dL (ref 30.0–36.0)
MCV: 90.1 fL (ref 80.0–100.0)
Platelets: 397 K/uL (ref 150–400)
RBC: 5.04 MIL/uL (ref 3.87–5.11)
RDW: 12.5 % (ref 11.5–15.5)
WBC: 10.3 K/uL (ref 4.0–10.5)
nRBC: 0 % (ref 0.0–0.2)

## 2023-12-21 LAB — LIPASE, BLOOD: Lipase: 25 U/L (ref 11–51)

## 2023-12-21 LAB — POC URINE PREG, ED: Preg Test, Ur: NEGATIVE

## 2023-12-21 MED ORDER — TRAMADOL HCL 50 MG PO TABS: 50.0000 mg | ORAL_TABLET | Freq: Four times a day (QID) | ORAL | 0 refills | Status: DC | PRN

## 2023-12-21 MED ORDER — KETOROLAC TROMETHAMINE 30 MG/ML IJ SOLN
30.0000 mg | Freq: Once | INTRAMUSCULAR | Status: AC
  Administered 2023-12-21: 30 mg via INTRAMUSCULAR
  Filled 2023-12-21: qty 1

## 2023-12-21 NOTE — ED Notes (Signed)
 See triage note   Presents with a 2 day hx of left flank pain  Denies any fever  but has had some dysuria

## 2023-12-21 NOTE — ED Provider Notes (Signed)
 [facility] Provider Note    Event Date/Time   First MD Initiated Contact with Patient 12/21/23 1757     (approximate)   History   Flank Pain   HPI  Kelsey Ho is a 33 y.o. female who presents with complaints of left lower back pain.  Patient reports she has had this for quite some time and it seems to flareup every once in a while.  She denies flank pain or abdominal pain to me.  No fevers or chills, no dysuria.  Reports that her PCP has wanted to do trigger injections but in the past she has had an elevated heart rate which has prevented procedures.     Physical Exam   Triage Vital Signs: ED Triage Vitals  Encounter Vitals Group     BP 12/21/23 1711 121/89     Girls Systolic BP Percentile --      Girls Diastolic BP Percentile --      Boys Systolic BP Percentile --      Boys Diastolic BP Percentile --      Pulse Rate 12/21/23 1711 91     Resp 12/21/23 1711 17     Temp 12/21/23 1711 97.8 F (36.6 C)     Temp Source 12/21/23 1711 Oral     SpO2 12/21/23 1711 100 %     Weight 12/21/23 1714 65.3 kg (144 lb)     Height 12/21/23 1714 1.549 m (5' 1)     Head Circumference --      Peak Flow --      Pain Score 12/21/23 1714 8     Pain Loc --      Pain Education --      Exclude from Growth Chart --     Most recent vital signs: Vitals:   12/21/23 1711  BP: 121/89  Pulse: 91  Resp: 17  Temp: 97.8 F (36.6 C)  SpO2: 100%     General: Awake, no distress.  CV:  Good peripheral perfusion.  Resp:  Normal effort.  Abd:  No distention.  Mild bruising to the abdomen from fat burning injections that she had recently Other:  Mild tenderness left lumbar paraspinal area, no rash   ED Results / Procedures / Treatments   Labs (all labs ordered are listed, but only abnormal results are displayed) Labs Reviewed  COMPREHENSIVE METABOLIC PANEL WITH GFR - Abnormal; Notable for the following components:      Result Value   Creatinine, Ser 1.10 (*)     All other components within normal limits  URINALYSIS, ROUTINE W REFLEX MICROSCOPIC - Abnormal; Notable for the following components:   Color, Urine STRAW (*)    APPearance CLEAR (*)    All other components within normal limits  LIPASE, BLOOD  CBC  POC URINE PREG, ED     EKG     RADIOLOGY     PROCEDURES:  Critical Care performed:   Procedures   MEDICATIONS ORDERED IN ED: Medications  ketorolac  (TORADOL ) 30 MG/ML injection 30 mg (30 mg Intramuscular Given 12/21/23 1823)     IMPRESSION / MDM / ASSESSMENT AND PLAN / ED COURSE  I reviewed the triage vital signs and the nursing notes. Patient's presentation is most consistent with exacerbation of chronic illness.  Patient presents with left lower back pain as detailed above, this seems to be an exacerbation of a chronic illness.  Overall reassuring exam, no red flag symptoms.  Lab work is unremarkable.  Urinalysis is  unremarkable, not consistent with kidney stone.  Seems to be consistent with musculoskeletal pain.  IM Toradol  given,  Short prescription of tramadol, close outpatient follow-up recommended, no indication for admission        FINAL CLINICAL IMPRESSION(S) / ED DIAGNOSES   Final diagnoses:  Chronic left-sided low back pain without sciatica     Rx / DC Orders   ED Discharge Orders          Ordered    traMADol (ULTRAM) 50 MG tablet  Every 6 hours PRN        12/21/23 1818             Note:  This document was prepared using Dragon voice recognition software and may include unintentional dictation errors.   Arlander Charleston, MD 12/21/23 [phone]

## 2023-12-21 NOTE — ED Triage Notes (Signed)
 Pt presents to the ED via POV from home with left sided flank pain x2 days. Pt reports burning and pressure when urinating that started today. Hx of kidney stone and UTI's.

## 2023-12-30 MED ORDER — METOPROLOL SUCCINATE ER 25 MG PO TB24: 25.0000 mg | ORAL_TABLET | Freq: Every day | ORAL | 3 refills | Status: DC

## 2024-01-06 ENCOUNTER — Encounter: Payer: Self-pay | Admitting: Family

## 2024-01-06 ENCOUNTER — Other Ambulatory Visit: Payer: Self-pay | Admitting: Family

## 2024-01-06 DIAGNOSIS — E663 Overweight: Secondary | ICD-10-CM

## 2024-01-06 DIAGNOSIS — E782 Mixed hyperlipidemia: Secondary | ICD-10-CM

## 2024-01-06 DIAGNOSIS — I159 Secondary hypertension, unspecified: Secondary | ICD-10-CM

## 2024-01-11 ENCOUNTER — Encounter: Payer: Self-pay | Admitting: Family

## 2024-01-13 ENCOUNTER — Other Ambulatory Visit: Payer: Self-pay | Admitting: Family

## 2024-01-13 ENCOUNTER — Encounter: Payer: Self-pay | Admitting: Family

## 2024-01-13 DIAGNOSIS — E782 Mixed hyperlipidemia: Secondary | ICD-10-CM

## 2024-01-13 DIAGNOSIS — I159 Secondary hypertension, unspecified: Secondary | ICD-10-CM

## 2024-01-13 DIAGNOSIS — Z6825 Body mass index (BMI) 25.0-25.9, adult: Secondary | ICD-10-CM

## 2024-01-13 MED ORDER — TIRZEPATIDE-WEIGHT MANAGEMENT 5 MG/0.5ML ~~LOC~~ SOLN: 5.0000 mg | SUBCUTANEOUS | 1 refills | Status: AC

## 2024-01-13 NOTE — Therapy (Signed)
 OUTPATIENT PHYSICAL THERAPY LOWER EXTREMITY EVALUATION   Patient Name: Kelsey Ho MRN: [id number] DOB:1990/07/01, 33 y.o., female Today's Date: 01/14/2024  END OF SESSION:  PT End of Session - 01/14/24 1319     Visit Number 1    Number of Visits 12    Date for Recertification  02/25/24    Progress Note Due on Visit 10    PT Start Time 1318    PT Stop Time 1357    PT Time Calculation (min) 39 min          Past Medical History:  Diagnosis Date   Pinched nerve    Past Surgical History:  Procedure Laterality Date   COSMETIC SURGERY     Patient Active Problem List   Diagnosis Date Noted   Mixed hyperlipidemia 12/01/2023   Labile hypertension 12/01/2023   Left thyroid  nodule 09/16/2023   History of cocaine use 09/08/2023   Goiter 09/08/2023   Secondary hypertension 08/27/2023   B12 deficiency 02/12/2023   Elevated hemoglobin 02/12/2023   BMI 25.0-25.9,adult 02/12/2023   Abnormal EKG 02/12/2023   Fibromyalgia 12/26/2021   High risk medication use 11/18/2021   Chronic bilateral low back pain with bilateral sciatica 10/29/2021   Tachycardia 10/29/2021   History of opioid abuse (HCC) 10/25/2021   Seizure-like activity (HCC) 06/18/2021   Difficulty sleeping 06/18/2021   Chronic daily headache 06/18/2021   Insomnia 06/11/2021   Chronic pain 06/11/2021   Anxiety 06/11/2021    PCP: Corwin Antu, FNP   REFERRING PROVIDER: Kip Lynwood Double, PA-C   REFERRING DIAG:  [phone] (ICD-10-CM) - Pain in left ankle and joints of left foot  S82.832D (ICD-10-CM) - Other fracture of upper and lower end of left fibula, subsequent encounter for closed fracture with routine healing    THERAPY DIAG:  Left ankle pain, unspecified chronicity  Stiffness of left ankle, not elsewhere classified  Rationale for Evaluation and Treatment: Rehabilitation  ONSET DATE: 09/25/23  SUBJECTIVE:   SUBJECTIVE STATEMENT: Pt reports fracturing her ankle and she was using a boot for a  while. She reports she was walking her dog when she heard a pop in her ankle and had a fracture, she had been wearing heels a lot which she believes precipitated this. Pt reports pain at night and when she is sleeping. Pt is currently not in pain sitting but does have pain at night. At night patient pain in her left ankle ( up to 7/10). Pt has pain with stair climbing as well. Pt reports swelling in the ankle after activity which is frequent since she is on her feet a lot throughout her day. Pt reports she has a dog (150 lb) and he likes to play a lot. Pt hasn't had any falls.   PERTINENT HISTORY: Pmx of chronic pain, insomnia, chronic low back pain, fibromyalgia, chronic headache, drug use (clean now), HTN,  PAIN:  Are you having pain? Yes: NPRS scale: 0 sitting, up to 7 at night  Pain location: left ankle anterior Pain description:   Aggravating factors: stairs, activity Relieving factors: steroid pack, heating pad, red light therapy, epsom salt baths  PRECAUTIONS: None  RED FLAGS: None   WEIGHT BEARING RESTRICTIONS: No  FALLS:  Has patient fallen in last 6 months? No  LIVING ENVIRONMENT: Lives with: lives with their family Lives in: House/apartment Stairs: Yes: Internal: 1 flight steps; yes Has following equipment at home: None  OCCUPATION: pt is a horticulturist, commercial for trade and this problem is exacerbated by that  PLOF: Independent and no pain with ankle ever   PATIENT GOALS: improve pain in ankle at night and to improve swelling   NEXT MD VISIT: N/A  OBJECTIVE:  Note: Objective measures were completed at Evaluation unless otherwise noted.  DIAGNOSTIC FINDINGS: MR on 09/25/23:IMPRESSION: Subacute to chronic mildly displaced lateral malleolus fracture. Correlate with x-rays.   Either mild asymmetric degenerative edema versus focal stress reaction to the posterior and medial tibial plateau.  PATIENT SURVEYS:  LEFS    Survey date:    1: Any of your usual work, housework or  school activities 3  2. Usual hobbies, recreational or sporting activities 3  3. Getting into/out of the bath 3  4. Walking between rooms 4  5. Putting on socks/shoes 4  6. Squatting  4  7. Lifting an object, like a bag of groceries from the floor 4  8. Performing light activities around your home 4  9. Performing heavy activities around your home 3  10. Getting into/out of a car 4  11. Walking 2 blocks 3  12. Walking 1 mile 3  13. Going up/down 10 stairs (1 flight) 3  14. Standing for 1 hour 2  15.  sitting for 1 hour 4  16. Running on even ground 3  17. Running on uneven ground 4  18. Making sharp turns while running fast 4  19. Hopping  4  20. Rolling over in bed 4  Score total:  70    COGNITION: Overall cognitive status: Within functional limits for tasks assessed     SENSATION: Not tested Some numbness noted inferior to lateral maleoli  EDEMA:  Not measured but min visual edema on R>L, likely worse later in day and without current medication regimen  MUSCLE LENGTH: L gastroc tightness noted  POSTURE: No Significant postural limitations  PALPATION: Tender to palpation on L along lateral gastroc, fibularis longis and brevis  LOWER EXTREMITY ROM:  Active ROM Right eval Left eval  Hip flexion    Hip extension    Hip abduction    Hip adduction    Hip internal rotation    Hip external rotation    Knee flexion    Knee extension    Ankle dorsiflexion 20 10  Ankle plantarflexion 40 45  Ankle inversion 30 20  Ankle eversion 15 15   (Blank rows = not tested)  LOWER EXTREMITY MMT:  MMT Right eval Left eval  Hip flexion    Hip extension    Hip abduction    Hip adduction    Hip internal rotation    Hip external rotation    Knee flexion    Knee extension    Ankle dorsiflexion 5 5  Ankle plantarflexion 5 4  Ankle inversion 5 4  Ankle eversion 5 4- pop noted   (Blank rows = not tested)    FUNCTIONAL TESTS:  Single leg stance 30 sec ea LE with  increased postural sway and hip strategy needed on R compared to L  Able to complete 5 x single leg heel raises with ea LE  GAIT: Distance walked:  Assistive device utilized: None Level of assistance: Complete Independence Comments:  TREATMENT DATE: 01/14/24   SELF CARE Patient instructed in plan of care, findings for evaluation, and ways of physical therapy may improve their function and quality of life.  TE:   Educated regarding importance of hold times of 30-45 sec for benefits of stretch Access Code: [email or web address] URL: [email or web address] Date: 01/14/2024 Prepared by: Lonni Gainer  Exercises - Long Sitting Calf Stretch with Strap  - 1 x daily - 7 x weekly - 3 sets - 30 sec hold  5 X SL heel raises ea LE with UE support, instruction in eccentric lowering with normal heel raises.   PATIENT EDUCATION: Education details: POC Person educated: Patient Education method: Explanation Education comprehension: verbalized understanding   HOME EXERCISE PROGRAM: Access Code: [email or web address] URL: [email or web address] Date: 01/14/2024 Prepared by: Lonni Gainer  Exercises - Long Sitting Calf Stretch with Strap  - 1 x daily - 7 x weekly - 3 sets - 30 sec hold   ASSESSMENT:  CLINICAL IMPRESSION: Patient is a 33 y.o. F who was seen today for physical therapy evaluation and treatment for L ankle pain and limitations following a lateral malleolus fracture. Pt has complaints of pain with her daily activities and intense pain at night after long days of being on feet for her job and daily life. Pt also notes swelling that fluctuates. Her symptoms are mild now but relates this to steroid medicine prescribed and believes it may return and worsen when she finishes the medication. Pt has limited L ankle Rom and strength and  gastroc tightness  Pt will benefit from skilled PT to address above identified impairments and achieve above established PT goals.   OBJECTIVE IMPAIRMENTS: decreased activity tolerance, decreased mobility, decreased ROM, decreased strength, and pain.   ACTIVITY LIMITATIONS: sleeping and stairs  PARTICIPATION LIMITATIONS: shopping, community activity, and occupation  PERSONAL FACTORS: Past/current experiences, Time since onset of injury/illness/exacerbation, and 3+ comorbidities: chronic low back pain, fibromyalgia, chronic headache, HTN, HLD are also affecting patient's functional outcome.   REHAB POTENTIAL: Good  CLINICAL DECISION MAKING: Stable/uncomplicated  EVALUATION COMPLEXITY: Low   GOALS: Goals reviewed with patient? Yes  SHORT TERM GOALS: Target date: 02/04/2024     Patient will be independent in home exercise program to improve strength/mobility for better functional independence with ADLs. Baseline: No HEP currently  Goal status: INITIAL   LONG TERM GOALS: Target date: 02/25/2024  1.  Pt will improve LEFS by 8 points or greater to indicate improved perceived function of the left ankle and LE  Baseline: 70 Goal status: INITIAL  2.  Pt will improve LLE ankle ROM to within 5 degrees of R LE to indicate improve mobility for daily tasks.  Baseline: see chart in objective  Goal status: INITIAL  3.  Pt will report ability to sleep through night without report of ankle discomfort greater than 3/10 pain for improved sleep quality and improved QOL Baseline: 7/10 Goal status: INITIAL  4.  Pt will improve L ankle strength to 5/5 or greater with DF, inv and eversion to demonstrate improved max force production in her L ankle complex.  Baseline: 4/5 in above categories Goal status: INITIAL      PLAN:  PT FREQUENCY: 1-2x/week  PT DURATION: 6 weeks  PLANNED INTERVENTIONS: 97164- PT Re-evaluation, 97750- Physical Performance Testing, 97110-Therapeutic exercises, 97530- Therapeutic  activity, [id number]- Neuromuscular re-education, 97535- Self Care, [id number]- Manual therapy, 97116- Gait training, [phone] (1-2 muscles), [id number] (3+ muscles)- Dry Needling, Patient/Family education, Balance training, Stair training, Taping, Joint mobilization, Joint manipulation, Cryotherapy, and Moist  heat  PLAN FOR NEXT SESSION: Assess stairs\, reassess SL heel raise and SL balance when off of current medication round.    Lonni KATHEE Gainer, PT 01/14/2024, 3:48 PM

## 2024-01-14 ENCOUNTER — Ambulatory Visit: Payer: MEDICAID | Attending: Student | Admitting: Physical Therapy

## 2024-01-14 DIAGNOSIS — M25572 Pain in left ankle and joints of left foot: Secondary | ICD-10-CM | POA: Diagnosis present

## 2024-01-14 DIAGNOSIS — M25672 Stiffness of left ankle, not elsewhere classified: Secondary | ICD-10-CM | POA: Insufficient documentation

## 2024-01-14 NOTE — Telephone Encounter (Signed)
 Spoke with pt and she is needing a prescription for Zepbound  5mg  sent to [facility]. This prescription has already been sent to them as of 01/13/24 by Tabitha.

## 2024-01-14 NOTE — Telephone Encounter (Signed)
 Can you call her to clarify which dosage she wants and then can you send to the   pharmacy? [facility]

## 2024-01-15 ENCOUNTER — Encounter: Payer: Self-pay | Admitting: Family

## 2024-01-18 ENCOUNTER — Ambulatory Visit: Payer: MEDICAID | Admitting: Physical Therapy

## 2024-01-20 ENCOUNTER — Ambulatory Visit: Payer: MEDICAID

## 2024-01-20 DIAGNOSIS — M25572 Pain in left ankle and joints of left foot: Secondary | ICD-10-CM

## 2024-01-20 DIAGNOSIS — M25672 Stiffness of left ankle, not elsewhere classified: Secondary | ICD-10-CM

## 2024-01-20 NOTE — Therapy (Signed)
 OUTPATIENT PHYSICAL THERAPY LOWER EXTREMITY TREATMENT   Patient Name: Kelsey Ho MRN: [id number] DOB:1990/07/24, 33 y.o., female Today's Date: 01/20/2024  END OF SESSION:  PT End of Session - 01/20/24 1448     Visit Number 2    Number of Visits 12    Date for Recertification  02/25/24    Progress Note Due on Visit 10    PT Start Time 1448    PT Stop Time 1535    PT Time Calculation (min) 47 min    Equipment Utilized During Treatment Gait belt    Activity Tolerance Patient tolerated treatment well    Behavior During Therapy WFL for tasks assessed/performed          Past Medical History:  Diagnosis Date   Pinched nerve    Past Surgical History:  Procedure Laterality Date   COSMETIC SURGERY     Patient Active Problem List   Diagnosis Date Noted   Mixed hyperlipidemia 12/01/2023   Labile hypertension 12/01/2023   Left thyroid  nodule 09/16/2023   History of cocaine use 09/08/2023   Goiter 09/08/2023   Secondary hypertension 08/27/2023   B12 deficiency 02/12/2023   Elevated hemoglobin 02/12/2023   BMI 25.0-25.9,adult 02/12/2023   Abnormal EKG 02/12/2023   Fibromyalgia 12/26/2021   High risk medication use 11/18/2021   Chronic bilateral low back pain with bilateral sciatica 10/29/2021   Tachycardia 10/29/2021   History of opioid abuse (HCC) 10/25/2021   Seizure-like activity (HCC) 06/18/2021   Difficulty sleeping 06/18/2021   Chronic daily headache 06/18/2021   Insomnia 06/11/2021   Chronic pain 06/11/2021   Anxiety 06/11/2021    PCP: Corwin Antu, FNP   REFERRING PROVIDER: Kip Lynwood Double, PA-C   REFERRING DIAG:  [phone] (ICD-10-CM) - Pain in left ankle and joints of left foot  S82.832D (ICD-10-CM) - Other fracture of upper and lower end of left fibula, subsequent encounter for closed fracture with routine healing    THERAPY DIAG:  Left ankle pain, unspecified chronicity  Stiffness of left ankle, not elsewhere classified  Rationale for  Evaluation and Treatment: Rehabilitation  ONSET DATE: 09/25/23  SUBJECTIVE:   SUBJECTIVE STATEMENT: From today: Patient reports doing okay- stated completed steroid and pain comes and goes. Had some pain yesterday and worried about her ankle popping sound. States she also bought a mattress over the weekend which has helped her rest better.    From Eval: Pt reports fracturing her ankle and she was using a boot for a while. She reports she was walking her dog when she heard a pop in her ankle and had a fracture, she had been wearing heels a lot which she believes precipitated this. Pt reports pain at night and when she is sleeping. Pt is currently not in pain sitting but does have pain at night. At night patient pain in her left ankle ( up to 7/10). Pt has pain with stair climbing as well. Pt reports swelling in the ankle after activity which is frequent since she is on her feet a lot throughout her day. Pt reports she has a dog (150 lb) and he likes to play a lot. Pt hasn't had any falls.   PERTINENT HISTORY: Pmx of chronic pain, insomnia, chronic low back pain, fibromyalgia, chronic headache, drug use (clean now), HTN,  PAIN:  Are you having pain? Yes: NPRS scale: 0 sitting, up to 7 at night  Pain location: left ankle anterior Pain description:   Aggravating factors: stairs, activity Relieving factors: steroid pack, heating  pad, red light therapy, epsom salt baths  PRECAUTIONS: None  RED FLAGS: None   WEIGHT BEARING RESTRICTIONS: No  FALLS:  Has patient fallen in last 6 months? 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  DIAGNOSTIC FINDINGS: MR on 09/25/23:IMPRESSION: Subacute to chronic mildly displaced lateral malleolus fracture. Correlate with x-rays.   Either mild asymmetric degenerative edema versus focal stress reaction to the posterior and medial tibial plateau.  PATIENT SURVEYS:  LEFS    Survey date:    1: Any of your usual work, housework or school activities 3  2. Usual hobbies, recreational or sporting activities 3  3. Getting into/out of the bath 3  4. Walking between rooms 4  5. Putting on socks/shoes 4  6. Squatting  4  7. Lifting an object, like a bag of groceries from the floor 4  8. Performing light activities around your home 4  9. Performing heavy activities around your home 3  10. Getting into/out of a car 4  11. Walking 2 blocks 3  12. Walking 1 mile 3  13. Going up/down 10 stairs (1 flight) 3  14. Standing for 1 hour 2  15.  sitting for 1 hour 4  16. Running on even ground 3  17. Running on uneven ground 4  18. Making sharp turns while running fast 4  19. Hopping  4  20. Rolling over in bed 4  Score total:  70    COGNITION: Overall cognitive status: Within functional limits for tasks assessed     SENSATION: Not tested Some numbness noted inferior to lateral maleoli  EDEMA:  Not measured but min visual edema on R>L, likely worse later in day and without current medication regimen  MUSCLE LENGTH: L gastroc tightness noted  POSTURE: No Significant postural limitations  PALPATION: Tender to palpation on L along lateral gastroc, fibularis longis and brevis  LOWER EXTREMITY ROM:  Active ROM Right eval Left eval  Hip flexion    Hip extension    Hip abduction    Hip adduction    Hip internal rotation    Hip external rotation    Knee flexion    Knee extension    Ankle dorsiflexion 20 10  Ankle plantarflexion 40 45  Ankle inversion 30 20  Ankle eversion 15 15   (Blank rows =  not tested)  LOWER EXTREMITY MMT:  MMT Right eval Left eval  Hip flexion    Hip extension    Hip abduction    Hip adduction    Hip internal rotation    Hip external rotation    Knee flexion    Knee extension    Ankle dorsiflexion 5 5  Ankle plantarflexion 5 4  Ankle inversion 5 4  Ankle eversion 5 4- pop noted   (Blank rows = not tested)    FUNCTIONAL TESTS:  Single leg stance 30 sec ea LE with increased postural sway and hip strategy needed on R compared to L  Able to complete 5 x single leg heel raises with ea LE  GAIT: Distance walked:  Assistive device utilized: None Level of assistance: Complete Independence Comments:                                                                                                                                 TREATMENT DATE: 01/20/24   SELF CARE Patient instructed in HEP provided today and provided 3 colors of theraband. Reassessed SLS- able to stand > 20 sec each LE Reassessed 5x single heel raise- able to complete on LLE without difficulty  TE:   Review of 5 X SL heel raises ea LE with UE support, instruction in eccentric lowering with normal heel raises.  Review verbally of self calf stretch with towel.  Added the following: Ankle DF (resistive) RTB 2 x 10 seated - instructed in how to perform independently Ankle PF (resistive) RTB 2 x10 reps in long sitting Foot off stool Ankle Seated EV (resistive RTB) 2 x 10  Ankle Seated IV (resistive RTB) x 10  Kneeling on mat on floor with ankle Mob (towel around ankle) - progressive knee flex to facilitate increased DF with A/P force of Towel x 10 x 2 sets  (PT performed mob with towel today)  SLS- hold up to 20 sec each LE x 2 each LE today.   Manual therapy:  Grade 1-2 tib/fib mobs- A/P and P/A x 30 bouts each. Grade 2-3 tibiotalar mobs (A/P) x 30 bouts x 3   PATIENT EDUCATION: Education details: Exercise technique Person educated: Patient Education method:  Explanation/demonstration Education comprehension: verbalized understanding; return demonstration   HOME EXERCISE PROGRAM: Access Code: [email or web address] URL: [email or web address] Date: 01/20/2024 Prepared by: Reyes London  Exercises - Standing Ankle Dorsiflexion Self-Mobilization on Chair With Band  - 1 x daily - 3 sets - 10 reps - Kneeling Ankle Dorsiflexion Self-Mobilization with Towel  - 1 x daily - 3 sets - 10 reps - Seated Ankle Dorsiflexion with Resistance  - 1 x daily - 3 sets - 10 reps - Long Sitting Ankle Plantar Flexion with Resistance  - 1 x daily - 3 sets - 10 reps - Seated Ankle Eversion with Resistance  - 1 x daily - 3 sets - 10 reps - Seated Ankle Inversion with Resistance and Legs Crossed  - 1 x daily - 3 sets - 10 reps      Access Code: [email or web address] URL: [email or web address] Date: 01/14/2024 Prepared by: Lonni Gainer  Exercises - Long Sitting Calf Stretch with Strap  - 1 x daily - 7 x weekly - 3 sets - 30 sec hold   ASSESSMENT:  CLINICAL IMPRESSION: Patient is a 33 y.o. F who was seen today for physical therapy treatment for L ankle pain and limitations following a lateral malleolus fracture. Reassessed her ankle stability with SLS and strength with 5 x single leg raise after coming off the steroid. Patient performed both without report of pain today. She was introduced to some basic resistive ankle activities that  can be performed in the home on her own. She was able to return demonstration of all ankle activities and HEP was updated and theraband was provided. She will benefit from review of HEP next visit and progression of ankle stability/higher level balance activities next 1-2 visits. Pt will benefit from skilled PT to address above identified impairments and achieve above established PT goals.   OBJECTIVE IMPAIRMENTS: decreased activity tolerance, decreased mobility, decreased ROM, decreased strength, and pain.   ACTIVITY  LIMITATIONS: sleeping and stairs  PARTICIPATION LIMITATIONS: shopping, community activity, and occupation  PERSONAL FACTORS: Past/current experiences, Time since onset of injury/illness/exacerbation, and 3+ comorbidities: chronic low back pain, fibromyalgia, chronic headache, HTN, HLD are also affecting patient's functional outcome.   REHAB POTENTIAL: Good  CLINICAL DECISION MAKING: Stable/uncomplicated  EVALUATION COMPLEXITY: Low   GOALS: Goals reviewed with patient? Yes  SHORT TERM GOALS: Target date: 02/04/2024     Patient will be independent in home exercise program to improve strength/mobility for better functional independence with ADLs. Baseline: No HEP currently  Goal status: INITIAL   LONG TERM GOALS: Target date: 02/25/2024  1.  Pt will improve LEFS by 8 points or greater to indicate improved perceived function of the left ankle and LE  Baseline: 70 Goal status: INITIAL  2.  Pt will improve LLE ankle ROM to within 5 degrees of R LE to indicate improve mobility for daily tasks.  Baseline: see chart in objective  Goal status: INITIAL  3.  Pt will report ability to sleep through night without report of ankle discomfort greater than 3/10 pain for improved sleep quality and improved QOL Baseline: 7/10 Goal status: INITIAL  4.  Pt will improve L ankle strength to 5/5 or greater with DF, inv and eversion to demonstrate improved max force production in her L ankle complex.  Baseline: 4/5 in above categories Goal status: INITIAL      PLAN:  PT FREQUENCY: 1-2x/week  PT DURATION: 6 weeks  PLANNED INTERVENTIONS: 97164- PT Re-evaluation, 97750- Physical Performance Testing, 97110-Therapeutic exercises, 97530- Therapeutic activity, [id number]- Neuromuscular re-education, 97535- Self Care, [id number]- Manual therapy, 97116- Gait training, [phone] (1-2 muscles), 20561 (3+ muscles)- Dry Needling, Patient/Family education, Balance training, Stair training, Taping, Joint mobilization,  Joint manipulation, Cryotherapy, and Moist heat  PLAN FOR NEXT SESSION:  Assess stairs Progress Ankle stability/balance interventions. Review HEP and progress ankle strengthening as appropriate.     Reyes LOISE London, PT 01/20/2024, 4:13 PM

## 2024-01-26 ENCOUNTER — Ambulatory Visit: Payer: MEDICAID | Admitting: Physical Therapy

## 2024-01-26 DIAGNOSIS — M25572 Pain in left ankle and joints of left foot: Secondary | ICD-10-CM | POA: Diagnosis present

## 2024-01-26 DIAGNOSIS — M25672 Stiffness of left ankle, not elsewhere classified: Secondary | ICD-10-CM | POA: Insufficient documentation

## 2024-01-26 NOTE — Therapy (Unsigned)
 OUTPATIENT PHYSICAL THERAPY LOWER EXTREMITY TREATMENT   Patient Name: Kelsey Ho MRN: [id number] DOB:Mar 22, 1990, 33 y.o., female Today's Date: 01/26/2024  END OF SESSION:  PT End of Session - 01/26/24 1614     Visit Number 3    Number of Visits 12    Date for Recertification  02/25/24    Progress Note Due on Visit 10    PT Start Time 1617    PT Stop Time 1657    PT Time Calculation (min) 40 min    Equipment Utilized During Treatment Gait belt    Activity Tolerance Patient tolerated treatment well    Behavior During Therapy WFL for tasks assessed/performed          Past Medical History:  Diagnosis Date   Pinched nerve    Past Surgical History:  Procedure Laterality Date   COSMETIC SURGERY     Patient Active Problem List   Diagnosis Date Noted   Mixed hyperlipidemia 12/01/2023   Labile hypertension 12/01/2023   Left thyroid  nodule 09/16/2023   History of cocaine use 09/08/2023   Goiter 09/08/2023   Secondary hypertension 08/27/2023   B12 deficiency 02/12/2023   Elevated hemoglobin 02/12/2023   BMI 25.0-25.9,adult 02/12/2023   Abnormal EKG 02/12/2023   Fibromyalgia 12/26/2021   High risk medication use 11/18/2021   Chronic bilateral low back pain with bilateral sciatica 10/29/2021   Tachycardia 10/29/2021   History of opioid abuse (HCC) 10/25/2021   Seizure-like activity (HCC) 06/18/2021   Difficulty sleeping 06/18/2021   Chronic daily headache 06/18/2021   Insomnia 06/11/2021   Chronic pain 06/11/2021   Anxiety 06/11/2021    PCP: Corwin Antu, FNP   REFERRING PROVIDER: Kip Lynwood Double, PA-C   REFERRING DIAG:  [phone] (ICD-10-CM) - Pain in left ankle and joints of left foot  S82.832D (ICD-10-CM) - Other fracture of upper and lower end of left fibula, subsequent encounter for closed fracture with routine healing    THERAPY DIAG:  Left ankle pain, unspecified chronicity  Stiffness of left ankle, not elsewhere classified  Rationale for  Evaluation and Treatment: Rehabilitation  ONSET DATE: 09/25/23  SUBJECTIVE:   SUBJECTIVE STATEMENT:  From today: Pt reports doing well today, has felt improvement in symptoms since starting PT. Still having discomfort in the evenings/ went to bed.   From Eval: Pt reports fracturing her ankle and she was using a boot for a while. She reports she was walking her dog when she heard a pop in her ankle and had a fracture, she had been wearing heels a lot which she believes precipitated this. Pt reports pain at night and when she is sleeping. Pt is currently not in pain sitting but does have pain at night. At night patient pain in her left ankle ( up to 7/10). Pt has pain with stair climbing as well. Pt reports swelling in the ankle after activity which is frequent since she is on her feet a lot throughout her day. Pt reports she has a dog (150 lb) and he likes to play a lot. Pt hasn't had any falls.   PERTINENT HISTORY: Pmx of chronic pain, insomnia, chronic low back pain, fibromyalgia, chronic headache, drug use (clean now), HTN,  PAIN:  Are you having pain? Yes: NPRS scale: 0 sitting, up to 7 at night  Pain location: left ankle anterior Pain description:   Aggravating factors: stairs, activity Relieving factors: steroid pack, heating pad, red light therapy, epsom salt baths  PRECAUTIONS: None  RED FLAGS: None  WEIGHT BEARING RESTRICTIONS: No  FALLS:  Has patient fallen in last 6 months? 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  DIAGNOSTIC  FINDINGS: MR on 09/25/23:IMPRESSION: Subacute to chronic mildly displaced lateral malleolus fracture. Correlate with x-rays.   Either mild asymmetric degenerative edema versus focal stress reaction to the posterior and medial tibial plateau.  PATIENT SURVEYS:  LEFS    Survey date:    1: Any of your usual work, housework or school activities 3  2. Usual hobbies, recreational or sporting activities 3  3. Getting into/out of the bath 3  4. Walking between rooms 4  5. Putting on socks/shoes 4  6. Squatting  4  7. Lifting an object, like a bag of groceries from the floor 4  8. Performing light activities around your home 4  9. Performing heavy activities around your home 3  10. Getting into/out of a car 4  11. Walking 2 blocks 3  12. Walking 1 mile 3  13. Going up/down 10 stairs (1 flight) 3  14. Standing for 1 hour 2  15.  sitting for 1 hour 4  16. Running on even ground 3  17. Running on uneven ground 4  18. Making sharp turns while running fast 4  19. Hopping  4  20. Rolling over in bed 4  Score total:  70    COGNITION: Overall cognitive status: Within functional limits for tasks assessed     SENSATION: Not tested Some numbness noted inferior to lateral maleoli  EDEMA:  Not measured but min visual edema on R>L, likely worse later in day and without current medication regimen  MUSCLE LENGTH: L gastroc tightness noted  POSTURE: No Significant postural limitations  PALPATION: Tender to palpation on L along lateral gastroc, fibularis longis and brevis  LOWER EXTREMITY ROM:  Active ROM Right eval Left eval  Hip flexion    Hip extension    Hip abduction    Hip adduction    Hip internal rotation    Hip external rotation    Knee flexion    Knee extension    Ankle dorsiflexion 20 10  Ankle plantarflexion 40 45  Ankle inversion 30 20  Ankle eversion 15 15   (Blank rows = not tested)  LOWER EXTREMITY MMT:  MMT Right eval Left eval  Hip flexion    Hip  extension    Hip abduction    Hip adduction    Hip internal rotation    Hip external rotation    Knee flexion    Knee extension    Ankle dorsiflexion 5 5  Ankle plantarflexion 5 4  Ankle inversion 5 4  Ankle eversion 5 4- pop noted   (Blank rows = not tested)    FUNCTIONAL TESTS:  Single leg stance 30 sec ea LE with increased postural sway and hip strategy needed on R compared to L  Able to complete 5 x single leg heel raises with ea LE  GAIT: Distance walked:  Assistive device utilized: None Level of assistance: Complete Independence Comments:  TREATMENT DATE: 01/26/24   Manual therapy:   Grade 1-2 tibiotalar mobs- A/P and P/A x 30 bouts each. Gastroc IASTM x 5 min, no significant trigger points noted   TE- To improve strength, endurance, mobility, and function of specific targeted muscle groups or improve joint range of motion or improve muscle flexibility  4 way ankle strengthening with GTB - x 20 ea direction   TA- To improve functional movements patterns for everyday tasks   On airex pad LLE SLS 3 x 30 sec alternating after ea set with the below  On incline board 3 x 10 with eccentric LLE lowering SL   Sidestepping with band around balls of feet 2 x 10 reps ea side with RTB   Stance on wobble board lateral wobble orientation 3 x 30 with a/p wobble 3 x 30 sec   Pt reports fatigue in ankle musculature following.   PATIENT EDUCATION: Education details: Exercise technique Person educated: Patient Education method: Explanation/demonstration Education comprehension: verbalized understanding; return demonstration   HOME EXERCISE PROGRAM: Access Code: [email or web address] URL: [email or web address] Date: 01/20/2024 Prepared by: Reyes London  Exercises - Standing Ankle Dorsiflexion Self-Mobilization on Chair With Band  - 1 x  daily - 3 sets - 10 reps - Kneeling Ankle Dorsiflexion Self-Mobilization with Towel  - 1 x daily - 3 sets - 10 reps - Seated Ankle Dorsiflexion with Resistance  - 1 x daily - 3 sets - 10 reps - Long Sitting Ankle Plantar Flexion with Resistance  - 1 x daily - 3 sets - 10 reps - Seated Ankle Eversion with Resistance  - 1 x daily - 3 sets - 10 reps - Seated Ankle Inversion with Resistance and Legs Crossed  - 1 x daily - 3 sets - 10 reps    Access Code: [email or web address] URL: [email or web address] Date: 01/14/2024 Prepared by: Lonni Gainer  Exercises - Long Sitting Calf Stretch with Strap  - 1 x daily - 7 x weekly - 3 sets - 30 sec hold   ASSESSMENT:  CLINICAL IMPRESSION:  Patient is a 33 y.o. F who was seen today for physical therapy treatment for L ankle pain and limitations following a lateral malleolus fracture. Progressed with ankle stabilization and strength following brief manual therapy form improved muscle length and joint mobility. Pt reports fatigue in ankle following treatment but no increased pain.  Pt will benefit from skilled PT to address above identified impairments and achieve above established PT goals.   OBJECTIVE IMPAIRMENTS: decreased activity tolerance, decreased mobility, decreased ROM, decreased strength, and pain.   ACTIVITY LIMITATIONS: sleeping and stairs  PARTICIPATION LIMITATIONS: shopping, community activity, and occupation  PERSONAL FACTORS: Past/current experiences, Time since onset of injury/illness/exacerbation, and 3+ comorbidities: chronic low back pain, fibromyalgia, chronic headache, HTN, HLD are also affecting patient's functional outcome.   REHAB POTENTIAL: Good  CLINICAL DECISION MAKING: Stable/uncomplicated  EVALUATION COMPLEXITY: Low   GOALS: Goals reviewed with patient? Yes  SHORT TERM GOALS: Target date: 02/04/2024     Patient will be independent in home exercise program to improve strength/mobility for better  functional independence with ADLs. Baseline: No HEP currently  Goal status: INITIAL   LONG TERM GOALS: Target date: 02/25/2024  1.  Pt will improve LEFS by 8 points or greater to indicate improved perceived function of the left ankle and LE  Baseline: 70 Goal status: INITIAL  2.  Pt will improve LLE ankle ROM to within 5 degrees of R LE to indicate improve mobility for daily tasks.  Baseline: see  chart in objective  Goal status: INITIAL  3.  Pt will report ability to sleep through night without report of ankle discomfort greater than 3/10 pain for improved sleep quality and improved QOL Baseline: 7/10 Goal status: INITIAL  4.  Pt will improve L ankle strength to 5/5 or greater with DF, inv and eversion to demonstrate improved max force production in her L ankle complex.  Baseline: 4/5 in above categories Goal status: INITIAL      PLAN:  PT FREQUENCY: 1-2x/week  PT DURATION: 6 weeks  PLANNED INTERVENTIONS: 97164- PT Re-evaluation, 97750- Physical Performance Testing, 97110-Therapeutic exercises, 97530- Therapeutic activity, [id number]- Neuromuscular re-education, 97535- Self Care, 02859- Manual therapy, 97116- Gait training, [phone] (1-2 muscles), 20561 (3+ muscles)- Dry Needling, Patient/Family education, Balance training, Stair training, Taping, Joint mobilization, Joint manipulation, Cryotherapy, and Moist heat  PLAN FOR NEXT SESSION:  Assess stairs Progress Ankle stability/balance interventions. Review HEP and progress ankle strengthening as appropriate.     Lonni KATHEE Gainer, PT 01/26/2024, 4:14 PM

## 2024-01-28 ENCOUNTER — Ambulatory Visit: Payer: MEDICAID

## 2024-01-28 DIAGNOSIS — M25672 Stiffness of left ankle, not elsewhere classified: Secondary | ICD-10-CM

## 2024-01-28 DIAGNOSIS — M25572 Pain in left ankle and joints of left foot: Secondary | ICD-10-CM

## 2024-01-28 NOTE — Therapy (Signed)
 OUTPATIENT PHYSICAL THERAPY LOWER EXTREMITY TREATMENT   Patient Name: Kelsey Ho MRN: [id number] DOB:1990-09-14, 33 y.o., female Today's Date: 01/29/2024  END OF SESSION:  PT End of Session - 01/28/24 1714     Visit Number 4    Number of Visits 12    Date for Recertification  02/25/24    Progress Note Due on Visit 10    PT Start Time 1616    PT Stop Time 1700    PT Time Calculation (min) 44 min    Equipment Utilized During Treatment Gait belt    Activity Tolerance Patient tolerated treatment well    Behavior During Therapy WFL for tasks assessed/performed           Past Medical History:  Diagnosis Date   Pinched nerve    Past Surgical History:  Procedure Laterality Date   COSMETIC SURGERY     Patient Active Problem List   Diagnosis Date Noted   Mixed hyperlipidemia 12/01/2023   Labile hypertension 12/01/2023   Left thyroid  nodule 09/16/2023   History of cocaine use 09/08/2023   Goiter 09/08/2023   Secondary hypertension 08/27/2023   B12 deficiency 02/12/2023   Elevated hemoglobin 02/12/2023   BMI 25.0-25.9,adult 02/12/2023   Abnormal EKG 02/12/2023   Fibromyalgia 12/26/2021   High risk medication use 11/18/2021   Chronic bilateral low back pain with bilateral sciatica 10/29/2021   Tachycardia 10/29/2021   History of opioid abuse (HCC) 10/25/2021   Seizure-like activity (HCC) 06/18/2021   Difficulty sleeping 06/18/2021   Chronic daily headache 06/18/2021   Insomnia 06/11/2021   Chronic pain 06/11/2021   Anxiety 06/11/2021    PCP: Corwin Antu, FNP   REFERRING PROVIDER: Kip Lynwood Double, PA-C   REFERRING DIAG:  [phone] (ICD-10-CM) - Pain in left ankle and joints of left foot  S82.832D (ICD-10-CM) - Other fracture of upper and lower end of left fibula, subsequent encounter for closed fracture with routine healing    THERAPY DIAG:  Left ankle pain, unspecified chronicity  Stiffness of left ankle, not elsewhere classified  Rationale for  Evaluation and Treatment: Rehabilitation  ONSET DATE: 09/25/23  SUBJECTIVE:   SUBJECTIVE STATEMENT:  From today: Pt reports feeling very sore after last session and could tell she was working some weak ankle muscles. She said it wasn't painful during visit but very sore after.   From Eval: Pt reports fracturing her ankle and she was using a boot for a while. She reports she was walking her dog when she heard a pop in her ankle and had a fracture, she had been wearing heels a lot which she believes precipitated this. Pt reports pain at night and when she is sleeping. Pt is currently not in pain sitting but does have pain at night. At night patient pain in her left ankle ( up to 7/10). Pt has pain with stair climbing as well. Pt reports swelling in the ankle after activity which is frequent since she is on her feet a lot throughout her day. Pt reports she has a dog (150 lb) and he likes to play a lot. Pt hasn't had any falls.   PERTINENT HISTORY: Pmx of chronic pain, insomnia, chronic low back pain, fibromyalgia, chronic headache, drug use (clean now), HTN,  PAIN:  Are you having pain? Yes: NPRS scale: 0 sitting, up to 7 at night  Pain location: left ankle anterior Pain description:   Aggravating factors: stairs, activity Relieving factors: steroid pack, heating pad, red light therapy, epsom salt baths  PRECAUTIONS: None  RED FLAGS: None   WEIGHT BEARING RESTRICTIONS: No  FALLS:  Has patient fallen in last 6 months? 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  DIAGNOSTIC FINDINGS: MR on 09/25/23:IMPRESSION: Subacute to chronic mildly displaced lateral malleolus fracture. Correlate with x-rays.   Either mild asymmetric degenerative edema versus focal stress reaction to the posterior and medial tibial plateau.  PATIENT SURVEYS:  LEFS    Survey date:    1: Any of your usual work, housework or school activities 3  2. Usual hobbies, recreational or sporting activities 3  3. Getting into/out of the bath 3  4. Walking between rooms 4  5. Putting on socks/shoes 4  6. Squatting  4  7. Lifting an object, like a bag of groceries from the floor 4  8. Performing light activities around your home 4  9. Performing heavy activities around your home 3  10. Getting into/out of a car 4  11. Walking 2 blocks 3  12. Walking 1 mile 3  13. Going up/down 10 stairs (1 flight) 3  14. Standing for 1 hour 2  15.  sitting for 1 hour 4  16. Running on even ground 3  17. Running on uneven ground 4  18. Making sharp turns while running fast 4  19. Hopping  4  20. Rolling over in bed 4  Score total:  70    COGNITION: Overall cognitive status: Within functional limits for tasks assessed     SENSATION: Not tested Some numbness noted inferior to lateral maleoli  EDEMA:  Not measured but min visual edema on R>L, likely worse later in day and without current medication regimen  MUSCLE LENGTH: L gastroc tightness noted  POSTURE: No Significant postural limitations  PALPATION: Tender to palpation on L along lateral gastroc, fibularis longis and brevis  LOWER EXTREMITY ROM:  Active ROM Right eval Left eval  Hip flexion    Hip extension    Hip abduction    Hip adduction    Hip internal rotation    Hip external rotation    Knee flexion    Knee extension    Ankle dorsiflexion 20 10  Ankle plantarflexion 40 45  Ankle inversion 30 20  Ankle eversion 15 15   (Blank rows = not tested)  LOWER EXTREMITY MMT:  MMT Right eval  Left eval  Hip flexion    Hip extension    Hip abduction    Hip adduction    Hip internal rotation    Hip external rotation    Knee flexion    Knee extension    Ankle dorsiflexion 5 5  Ankle plantarflexion 5 4  Ankle inversion 5 4  Ankle eversion 5 4- pop noted   (Blank rows = not tested)    FUNCTIONAL TESTS:  Single leg stance 30 sec ea LE with increased postural sway and hip strategy needed on R compared to L  Able to complete 5 x single leg heel raises with ea LE  GAIT: Distance walked:  Assistive device utilized:  None Level of assistance: Complete Independence Comments:                                                                                                                                 TREATMENT DATE: 01/29/24   Manual therapy:   Grade 2-3 tibiotalar mobs- A/P and P/A x 30 bouts each.   TE- To improve strength, endurance, mobility, and function of specific targeted muscle groups or improve joint range of motion or improve muscle flexibility  PROM R ankle DF/PF/IV/EV x several min each direction- total of 6 min  4 way ankle strengthening with GTB -  2x 10 ea direction in long sitting and sidelye positions  TA- To improve functional movements patterns for everyday tasks   SLS on AIREX PAD x 20 sec (multiple trials each LE) Static stand on airex pad - feet together (eyes closed x 30 sec) - mild a/p sway TANDEM STAND on airex beam x 30 sec x 2 TANDEM walk on airex beam x length of beam - back and forth x 5 (only mild unsteadiness)  STAND ON BOSU Ball (curve side down) - x 30 sec x 2 STAND ON BOSU Ball (with ant/post weight shift) x 25 reps each direction STAND ON BOSU Ball (with lateral weight shift) x 20 reps each STAND ON ROCKERBOARD (hold level) x 30 sec eyes open- EASY  STAND ON ROCKERBOARD positioned in A/P (hold level) x 30 sec eyes closed- Harder and more A/P sway STAND ON ROCKERBOARD positioned in lateral - x 20 reps ea direction   PATIENT  EDUCATION: Education details: Exercise technique Person educated: Patient Education method: Explanation/demonstration Education comprehension: verbalized understanding; return demonstration   HOME EXERCISE PROGRAM: Access Code: [email or web address] URL: [email or web address] Date: 01/20/2024 Prepared by: Reyes London  Exercises - Standing Ankle Dorsiflexion Self-Mobilization on Chair With Band  - 1 x daily - 3 sets - 10 reps - Kneeling Ankle Dorsiflexion Self-Mobilization with Towel  - 1 x daily - 3 sets - 10 reps - Seated Ankle Dorsiflexion with Resistance  - 1 x daily - 3 sets - 10 reps - Long Sitting Ankle Plantar Flexion with Resistance  - 1 x daily - 3 sets - 10 reps - Seated Ankle Eversion with Resistance  - 1 x daily - 3 sets - 10 reps - Seated Ankle Inversion with Resistance and Legs Crossed  - 1 x daily - 3 sets - 10 reps    Access Code: [email or web address] URL: [email or web address] Date: 01/14/2024 Prepared by: Lonni Gainer  Exercises - Long Sitting Calf Stretch with Strap  - 1 x daily - 7 x weekly - 3 sets - 30 sec hold   ASSESSMENT:  CLINICAL IMPRESSION:  Patient is a 33 y.o. F who was seen today for physical therapy treatment for L ankle pain and limitations following a lateral malleolus fracture. Patient presented with some soreness initially yet very good ROM with passive mobility today and no pain  again reported throughout session. She was able to peform some anke strengthening with GTB and later some balance/dynamic stabilization activities with some in session progress with steadiness and adapted well to balance tasks. Again no pain reported but instructed her to let next PT know if any ongoing undo soreness post treatment.  Pt will benefit from skilled PT to address above identified impairments and achieve above established PT goals.   OBJECTIVE IMPAIRMENTS: decreased activity tolerance, decreased mobility, decreased ROM, decreased strength,  and pain.   ACTIVITY LIMITATIONS: sleeping and stairs  PARTICIPATION LIMITATIONS: shopping, community activity, and occupation  PERSONAL FACTORS: Past/current experiences, Time since onset of injury/illness/exacerbation, and 3+ comorbidities: chronic low back pain, fibromyalgia, chronic headache, HTN, HLD are also affecting patient's functional outcome.   REHAB POTENTIAL: Good  CLINICAL DECISION MAKING: Stable/uncomplicated  EVALUATION COMPLEXITY: Low   GOALS: Goals reviewed with patient? Yes  SHORT TERM GOALS: Target date: 02/04/2024     Patient will be independent in home exercise program to improve strength/mobility for better functional independence with ADLs. Baseline: No HEP currently  Goal status: INITIAL   LONG TERM GOALS: Target date: 02/25/2024  1.  Pt will improve LEFS by 8 points or greater to indicate improved perceived function of the left ankle and LE  Baseline: 70 Goal status: INITIAL  2.  Pt will improve LLE ankle ROM to within 5 degrees of R LE to indicate improve mobility for daily tasks.  Baseline: see chart in objective  Goal status: INITIAL  3.  Pt will report ability to sleep through night without report of ankle discomfort greater than 3/10 pain for improved sleep quality and improved QOL Baseline: 7/10 Goal status: INITIAL  4.  Pt will improve L ankle strength to 5/5 or greater with DF, inv and eversion to demonstrate improved max force production in her L ankle complex.  Baseline: 4/5 in above categories Goal status: INITIAL      PLAN:  PT FREQUENCY: 1-2x/week  PT DURATION: 6 weeks  PLANNED INTERVENTIONS: 97164- PT Re-evaluation, 97750- Physical Performance Testing, 97110-Therapeutic exercises, 97530- Therapeutic activity, [id number]- Neuromuscular re-education, 97535- Self Care, [id number]- Manual therapy, 97116- Gait training, [phone] (1-2 muscles), 20561 (3+ muscles)- Dry Needling, Patient/Family education, Balance training, Stair training,  Taping, Joint mobilization, Joint manipulation, Cryotherapy, and Moist heat  PLAN FOR NEXT SESSION:  Assess stairs Progress Ankle stability/balance interventions. Review HEP and progress ankle strengthening as appropriate.     Reyes LOISE London, PT 01/29/2024, 1:13 PM

## 2024-02-01 ENCOUNTER — Ambulatory Visit: Payer: MEDICAID | Admitting: Physical Therapy

## 2024-02-03 ENCOUNTER — Other Ambulatory Visit: Payer: Self-pay | Admitting: Family

## 2024-02-03 ENCOUNTER — Ambulatory Visit: Payer: MEDICAID

## 2024-02-03 DIAGNOSIS — Z6828 Body mass index (BMI) 28.0-28.9, adult: Secondary | ICD-10-CM

## 2024-02-03 DIAGNOSIS — E782 Mixed hyperlipidemia: Secondary | ICD-10-CM

## 2024-02-03 DIAGNOSIS — I159 Secondary hypertension, unspecified: Secondary | ICD-10-CM

## 2024-02-09 ENCOUNTER — Ambulatory Visit: Payer: MEDICAID | Admitting: Physical Therapy

## 2024-02-09 ENCOUNTER — Encounter: Payer: Self-pay | Admitting: Physical Therapy

## 2024-02-09 DIAGNOSIS — M25672 Stiffness of left ankle, not elsewhere classified: Secondary | ICD-10-CM

## 2024-02-09 DIAGNOSIS — M25572 Pain in left ankle and joints of left foot: Secondary | ICD-10-CM

## 2024-02-09 NOTE — Therapy (Unsigned)
 OUTPATIENT PHYSICAL THERAPY LOWER EXTREMITY TREATMENT   Patient Name: Kelsey Ho MRN: [id number] DOB:[birth year], 33 y.o., female Today's Date: 02/10/2024  END OF SESSION:  PT End of Session - 02/09/24 1624     Visit Number 5    Number of Visits 12    Date for Recertification  02/25/24    Progress Note Due on Visit 10    PT Start Time 1625    PT Stop Time 1700    PT Time Calculation (min) 35 min    Equipment Utilized During Treatment Gait belt    Activity Tolerance Patient tolerated treatment well    Behavior During Therapy WFL for tasks assessed/performed           Past Medical History:  Diagnosis Date   Pinched nerve    Past Surgical History:  Procedure Laterality Date   COSMETIC SURGERY     Patient Active Problem List   Diagnosis Date Noted   Mixed hyperlipidemia 12/01/2023   Labile hypertension 12/01/2023   Left thyroid  nodule 09/16/2023   History of cocaine use 09/08/2023   Goiter 09/08/2023   Secondary hypertension 08/27/2023   B12 deficiency 02/12/2023   Elevated hemoglobin 02/12/2023   BMI 25.0-25.9,adult 02/12/2023   Abnormal EKG 02/12/2023   Fibromyalgia 12/26/2021   High risk medication use 11/18/2021   Chronic bilateral low back pain with bilateral sciatica 10/29/2021   Tachycardia 10/29/2021   History of opioid abuse (HCC) 10/25/2021   Seizure-like activity (HCC) 06/18/2021   Difficulty sleeping 06/18/2021   Chronic daily headache 06/18/2021   Insomnia 06/11/2021   Chronic pain 06/11/2021   Anxiety 06/11/2021    PCP: Corwin Antu, FNP   REFERRING PROVIDER: Kip Lynwood Double, PA-C   REFERRING DIAG:  [phone] (ICD-10-CM) - Pain in left ankle and joints of left foot  S82.832D (ICD-10-CM) - Other fracture of upper and lower end of left fibula, subsequent encounter for closed fracture with routine healing    THERAPY DIAG:  Left ankle pain, unspecified chronicity  Stiffness of left ankle, not elsewhere classified  Rationale for  Evaluation and Treatment: Rehabilitation  ONSET DATE: 09/25/23  SUBJECTIVE:   SUBJECTIVE STATEMENT:  From today: Pt reports ankle feels achy today, 3-4 on the pain scale. Says last week she got dry needling, which irritated her lumbar spine, causing her to miss appointment last week.  From Eval: Pt reports fracturing her ankle and she was using a boot for a while. She reports she was walking her dog when she heard a pop in her ankle and had a fracture, she had been wearing heels a lot which she believes precipitated this. Pt reports pain at night and when she is sleeping. Pt is currently not in pain sitting but does have pain at night. At night patient pain in her left ankle ( up to 7/10). Pt has pain with stair climbing as well. Pt reports swelling in the ankle after activity which is frequent since she is on her feet a lot throughout her day. Pt reports she has a dog (150 lb) and he likes to play a lot. Pt hasn't had any falls.   PERTINENT HISTORY: Pmx of chronic pain, insomnia, chronic low back pain, fibromyalgia, chronic headache, drug use (clean now), HTN,  PAIN:  Are you having pain? Yes: NPRS scale: 0 sitting, up to 7 at night  Pain location: left ankle anterior Pain description:   Aggravating factors: stairs, activity Relieving factors: steroid pack, heating pad, red light therapy, epsom salt baths  PRECAUTIONS: None  RED FLAGS: None   WEIGHT BEARING RESTRICTIONS: No  FALLS:  Has patient fallen in last 6 months? 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  DIAGNOSTIC FINDINGS: MR on 09/25/23:IMPRESSION: Subacute to chronic mildly displaced lateral malleolus fracture. Correlate with x-rays.   Either mild asymmetric degenerative edema versus focal stress reaction to the posterior and medial tibial plateau.  PATIENT SURVEYS:  LEFS    Survey date:    1: Any of your usual work, housework or school activities 3  2. Usual hobbies, recreational or sporting activities 3  3. Getting into/out of the bath 3  4. Walking between rooms 4  5. Putting on socks/shoes 4  6. Squatting  4  7. Lifting an object, like a bag of groceries from the floor 4  8. Performing light activities around your home 4  9. Performing heavy activities around your home 3  10. Getting into/out of a car 4  11. Walking 2 blocks 3  12. Walking 1 mile 3  13. Going up/down 10 stairs (1 flight) 3  14. Standing for 1 hour 2  15.  sitting for 1 hour 4  16. Running on even ground 3  17. Running on uneven ground 4  18. Making sharp turns while running fast 4  19. Hopping  4  20. Rolling over in bed 4  Score total:  70    COGNITION: Overall cognitive status: Within functional limits for tasks assessed     SENSATION: Not tested Some numbness noted inferior to lateral maleoli  EDEMA:  Not measured but min visual edema on R>L, likely worse later in day and without current medication regimen  MUSCLE LENGTH: L gastroc tightness noted  POSTURE: No Significant postural limitations  PALPATION: Tender to palpation on L along lateral gastroc, fibularis longis and brevis  LOWER EXTREMITY ROM:  Active ROM Right eval Left eval  Hip flexion    Hip extension    Hip abduction    Hip adduction    Hip internal rotation    Hip external rotation    Knee flexion    Knee extension    Ankle dorsiflexion 20 10  Ankle plantarflexion 40 45  Ankle inversion 30 20  Ankle eversion 15 15   (Blank rows = not tested)  LOWER EXTREMITY MMT:  MMT Right eval  Left eval  Hip flexion    Hip extension    Hip abduction    Hip adduction    Hip internal rotation    Hip external rotation    Knee flexion    Knee extension    Ankle dorsiflexion 5 5  Ankle plantarflexion 5 4  Ankle inversion 5 4  Ankle eversion 5 4- pop noted   (Blank rows = not tested)    FUNCTIONAL TESTS:  Single leg stance 30 sec ea LE with increased postural sway and hip strategy needed on R compared to L  Able to complete 5 x single leg heel raises with ea LE  GAIT: Distance walked:  Assistive device utilized:  None Level of assistance: Complete Independence Comments:                                                                                                                                 TREATMENT DATE: 02/10/2024   GTB ankle 4-way in long sitting, 3x10 ea SLS on small green foam pad, 2x30s each side Tandem stance on blue foam pad, eyes closed, 2x30 each side Eccentric knee flexion with L foot on first step of staircase w/ heel tap; 3x10 AROM dorsiflexion against wall, feet placed 28ft away from wall, 3x10 Standing lunges at bar, 2x15 each side, one arm support on bar   PATIENT EDUCATION: Education details: Exercise technique Person educated: Patient Education method: Explanation/demonstration Education comprehension: verbalized understanding; return demonstration   HOME EXERCISE PROGRAM: Access Code: [email or web address] URL: [email or web address] Date: 01/20/2024 Prepared by: Reyes London  Exercises - Standing Ankle Dorsiflexion Self-Mobilization on Chair With Band  - 1 x daily - 3 sets - 10 reps - Kneeling Ankle Dorsiflexion Self-Mobilization with Towel  - 1 x daily - 3 sets - 10 reps - Seated Ankle Dorsiflexion with Resistance  - 1 x daily - 3 sets - 10 reps - Long Sitting Ankle Plantar Flexion with Resistance  - 1 x daily - 3 sets - 10 reps - Seated Ankle Eversion with Resistance  - 1 x daily - 3 sets - 10 reps - Seated Ankle Inversion  with Resistance and Legs Crossed  - 1 x daily - 3 sets - 10 reps    Access Code: [email or web address] URL: [email or web address] Date: 01/14/2024 Prepared by: Lonni Gainer  Exercises - Long Sitting Calf Stretch with Strap  - 1 x daily - 7 x weekly - 3 sets - 30 sec hold   ASSESSMENT:  CLINICAL IMPRESSION: Patient is a 33 y.o. F who was seen today for physical therapy treatment for L ankle pain and limitations following a lateral malleolus fracture. Session today focusing on dynamic ankle stabilization and strengthening activities. Patient noted minor achy feeling upon arrival to session, but reported no pain throughout. Pt showed good performance of single leg ankle strengthening activities, and needed demonstration for performance of lunges and eccentric knee flexion. Pt shows difficulty with eyes closed balance activities, specifically with L LE as the supporting leg. Pt will benefit from continued increase in challenge to ankle stabilizing strengthening exercises. Pt will continue to benefit from skilled therapy to address remaining deficits in order to improve overall QoL and return to PLOF.    OBJECTIVE IMPAIRMENTS: decreased activity tolerance, decreased mobility, decreased ROM, decreased strength, and pain.   ACTIVITY LIMITATIONS: sleeping and stairs  PARTICIPATION LIMITATIONS: shopping, community activity, and occupation  PERSONAL FACTORS: Past/current experiences, Time since onset of injury/illness/exacerbation, and 3+ comorbidities: chronic low back pain, fibromyalgia, chronic headache, HTN, HLD are also affecting patient's functional outcome.   REHAB POTENTIAL: Good  CLINICAL DECISION MAKING: Stable/uncomplicated  EVALUATION COMPLEXITY: Low   GOALS: Goals reviewed  with patient? Yes  SHORT TERM GOALS: Target date: 02/04/2024     Patient will be independent in home exercise program to improve strength/mobility for better functional independence with  ADLs. Baseline: No HEP currently  Goal status: INITIAL   LONG TERM GOALS: Target date: 02/25/2024  1.  Pt will improve LEFS by 8 points or greater to indicate improved perceived function of the left ankle and LE  Baseline: 70 Goal status: INITIAL  2.  Pt will improve LLE ankle ROM to within 5 degrees of R LE to indicate improve mobility for daily tasks.  Baseline: see chart in objective  Goal status: INITIAL  3.  Pt will report ability to sleep through night without report of ankle discomfort greater than 3/10 pain for improved sleep quality and improved QOL Baseline: 7/10 Goal status: INITIAL  4.  Pt will improve L ankle strength to 5/5 or greater with DF, inv and eversion to demonstrate improved max force production in her L ankle complex.  Baseline: 4/5 in above categories Goal status: INITIAL      PLAN:  PT FREQUENCY: 1-2x/week  PT DURATION: 6 weeks  PLANNED INTERVENTIONS: 97164- PT Re-evaluation, 97750- Physical Performance Testing, 97110-Therapeutic exercises, 97530- Therapeutic activity, [id number]- Neuromuscular re-education, 97535- Self Care, 02859- Manual therapy, 97116- Gait training, [phone] (1-2 muscles), 20561 (3+ muscles)- Dry Needling, Patient/Family education, Balance training, Stair training, Taping, Joint mobilization, Joint manipulation, Cryotherapy, and Moist heat  PLAN FOR NEXT SESSION:  Assess stairs Progress Ankle stability/balance interventions. Review HEP and progress ankle strengthening as appropriate.     Renna Helling, SPT  02/10/2024, 10:02 AM

## 2024-02-11 ENCOUNTER — Ambulatory Visit: Payer: MEDICAID

## 2024-02-23 ENCOUNTER — Ambulatory Visit: Payer: MEDICAID

## 2024-02-23 DIAGNOSIS — M25572 Pain in left ankle and joints of left foot: Secondary | ICD-10-CM

## 2024-02-23 DIAGNOSIS — M25672 Stiffness of left ankle, not elsewhere classified: Secondary | ICD-10-CM

## 2024-02-23 NOTE — Therapy (Signed)
 " OUTPATIENT PHYSICAL THERAPY LOWER EXTREMITY TREATMENT   Patient Name: Kelsey Ho MRN: [id number] DOB:October 19, 1990, 33 y.o., female Today's Date: 02/23/2024  END OF SESSION:  PT End of Session - 02/23/24 1556     Visit Number 6    Number of Visits 12    Date for Recertification  02/25/24    Progress Note Due on Visit 10    PT Start Time 1603    PT Stop Time 1650    PT Time Calculation (min) 47 min    Equipment Utilized During Treatment Gait belt    Activity Tolerance Patient tolerated treatment well    Behavior During Therapy WFL for tasks assessed/performed            Past Medical History:  Diagnosis Date   Pinched nerve    Past Surgical History:  Procedure Laterality Date   COSMETIC SURGERY     Patient Active Problem List   Diagnosis Date Noted   Mixed hyperlipidemia 12/01/2023   Labile hypertension 12/01/2023   Left thyroid  nodule 09/16/2023   History of cocaine use 09/08/2023   Goiter 09/08/2023   Secondary hypertension 08/27/2023   B12 deficiency 02/12/2023   Elevated hemoglobin 02/12/2023   BMI 25.0-25.9,adult 02/12/2023   Abnormal EKG 02/12/2023   Fibromyalgia 12/26/2021   High risk medication use 11/18/2021   Chronic bilateral low back pain with bilateral sciatica 10/29/2021   Tachycardia 10/29/2021   History of opioid abuse (HCC) 10/25/2021   Seizure-like activity (HCC) 06/18/2021   Difficulty sleeping 06/18/2021   Chronic daily headache 06/18/2021   Insomnia 06/11/2021   Chronic pain 06/11/2021   Anxiety 06/11/2021    PCP: Corwin Antu, FNP   REFERRING PROVIDER: Kip Lynwood Double, PA-C   REFERRING DIAG:  [phone] (ICD-10-CM) - Pain in left ankle and joints of left foot  S82.832D (ICD-10-CM) - Other fracture of upper and lower end of left fibula, subsequent encounter for closed fracture with routine healing    THERAPY DIAG:  Left ankle pain, unspecified chronicity  Stiffness of left ankle, not elsewhere classified  Rationale for  Evaluation and Treatment: Rehabilitation  ONSET DATE: 09/25/23  SUBJECTIVE:   SUBJECTIVE STATEMENT:  From today: Pt reports ongoing left ankle pain 4/10 on the numeric pain scale. States went back to MD and he prescribed more steroids, anti-inflammatory meds, and mm relaxer. Reports had a decent holiday but did report 1 fall in home- denies any injury.   From Eval: Pt reports fracturing her ankle and she was using a boot for a while. She reports she was walking her dog when she heard a pop in her ankle and had a fracture, she had been wearing heels a lot which she believes precipitated this. Pt reports pain at night and when she is sleeping. Pt is currently not in pain sitting but does have pain at night. At night patient pain in her left ankle ( up to 7/10). Pt has pain with stair climbing as well. Pt reports swelling in the ankle after activity which is frequent since she is on her feet a lot throughout her day. Pt reports she has a dog (150 lb) and he likes to play a lot. Pt hasn't had any falls.   PERTINENT HISTORY: Pmx of chronic pain, insomnia, chronic low back pain, fibromyalgia, chronic headache, drug use (clean now), HTN,  PAIN:  Are you having pain? Yes: NPRS scale: 0 sitting, up to 7 at night  Pain location: left ankle anterior Pain description:   Aggravating factors:  stairs, activity Relieving factors: steroid pack, heating pad, red light therapy, epsom salt baths  PRECAUTIONS: None  RED FLAGS: None   WEIGHT BEARING RESTRICTIONS: No  FALLS:  Has patient fallen in last 6 months? No  LIVING ENVIRONMENT: Lives with: lives with their family Lives in: House/apartment Stairs: Yes: Internal: 1 flight steps; yes Has following equipment at home: None  OCCUPATION: pt is a horticulturist, commercial for trade and this problem is exacerbated by that   PLOF: Independent and no pain with ankle ever   PATIENT GOALS: improve pain in ankle at night and to improve swelling   NEXT MD VISIT:  N/A  OBJECTIVE:  Note: Objective measures were completed at Evaluation unless otherwise noted.  DIAGNOSTIC FINDINGS: MR on 09/25/23:IMPRESSION: Subacute to chronic mildly displaced lateral malleolus fracture. Correlate with x-rays.   Either mild asymmetric degenerative edema versus focal stress reaction to the posterior and medial tibial plateau.  PATIENT SURVEYS:  LEFS    Survey date:    1: Any of your usual work, housework or school activities 3  2. Usual hobbies, recreational or sporting activities 3  3. Getting into/out of the bath 3  4. Walking between rooms 4  5. Putting on socks/shoes 4  6. Squatting  4  7. Lifting an object, like a bag of groceries from the floor 4  8. Performing light activities around your home 4  9. Performing heavy activities around your home 3  10. Getting into/out of a car 4  11. Walking 2 blocks 3  12. Walking 1 mile 3  13. Going up/down 10 stairs (1 flight) 3  14. Standing for 1 hour 2  15.  sitting for 1 hour 4  16. Running on even ground 3  17. Running on uneven ground 4  18. Making sharp turns while running fast 4  19. Hopping  4  20. Rolling over in bed 4  Score total:  70    COGNITION: Overall cognitive status: Within functional limits for tasks assessed     SENSATION: Not tested Some numbness noted inferior to lateral maleoli  EDEMA:  Not measured but min visual edema on R>L, likely worse later in day and without current medication regimen  MUSCLE LENGTH: L gastroc tightness noted  POSTURE: No Significant postural limitations  PALPATION: Tender to palpation on L along lateral gastroc, fibularis longis and brevis  LOWER EXTREMITY ROM:  Active ROM Right eval Left eval  Hip flexion    Hip extension    Hip abduction    Hip adduction    Hip internal rotation    Hip external rotation    Knee flexion    Knee extension    Ankle dorsiflexion 20 10  Ankle plantarflexion 40 45  Ankle inversion 30 20  Ankle eversion 15  15   (Blank rows = not tested)  LOWER EXTREMITY MMT:  MMT Right eval Left eval  Hip flexion    Hip extension    Hip abduction    Hip adduction    Hip internal rotation    Hip external rotation    Knee flexion    Knee extension    Ankle dorsiflexion 5 5  Ankle plantarflexion 5 4  Ankle inversion 5 4  Ankle eversion 5 4- pop noted   (Blank rows = not tested)    FUNCTIONAL TESTS:  Single leg stance 30 sec ea LE with increased postural sway and hip strategy needed on R compared to L  Able to complete 5 x  single leg heel raises with ea LE  GAIT: Distance walked:  Assistive device utilized: None Level of assistance: Complete Independence Comments:                                                                                                                                 TREATMENT DATE: 02/23/2024   Standing AROM dorsiflexion against wall, feet placed  5 away from wall- flexing front knee forward toward wall 3x10- VC to keep heel on ground.  SLS on airex pad with ball toss at wall  2 x 15 each LE Tandem stance on airex pad, self ball toss at wall-  30 reps each side Skaters w/o BUE support x 15 reps each side.  Standing split squats at bar (knee down to doubled up airex pad) 2x10  each side, one arm support on bar Tandem walking along 2 airex pads positioned side by side- fwd/bwd x 10  Single leg lateral hops- gentle - x 15 reps left to right  PATIENT EDUCATION: Education details: Exercise technique Person educated: Patient Education method: Explanation/demonstration Education comprehension: verbalized understanding; return demonstration   HOME EXERCISE PROGRAM: Access Code: [email or web address] URL: [email or web address] Date: 01/20/2024 Prepared by: Reyes London  Exercises - Standing Ankle Dorsiflexion Self-Mobilization on Chair With Band  - 1 x daily - 3 sets - 10 reps - Kneeling Ankle Dorsiflexion Self-Mobilization with Towel  - 1 x daily - 3 sets  - 10 reps - Seated Ankle Dorsiflexion with Resistance  - 1 x daily - 3 sets - 10 reps - Long Sitting Ankle Plantar Flexion with Resistance  - 1 x daily - 3 sets - 10 reps - Seated Ankle Eversion with Resistance  - 1 x daily - 3 sets - 10 reps - Seated Ankle Inversion with Resistance and Legs Crossed  - 1 x daily - 3 sets - 10 reps    Access Code: [email or web address] URL: [email or web address] Date: 01/14/2024 Prepared by: Lonni Gainer  Exercises - Long Sitting Calf Stretch with Strap  - 1 x daily - 7 x weekly - 3 sets - 30 sec hold   ASSESSMENT:  CLINICAL IMPRESSION: Patient is a 33 y.o. F who was seen today for physical therapy treatment for L ankle pain and limitations following a lateral malleolus fracture. Continued focus on ankle stabilization activities- SLS and tandem along with introduction of some gentle hopping. No increased pain during session- Patient reports she doesn't usually hurt until after exercises or being up on her LE's for prolonged period of time. Discussed using ice cup for minimal time 2-5 min directly to area of soreness. She verbalized understanding. Also patient was unsteady with more dynamic standing balance activities and will benefit from further training to challenge her balance and stabilize her ankle  Pt will continue to benefit from skilled therapy to address remaining deficits in order to improve overall QoL and return to PLOF.    OBJECTIVE IMPAIRMENTS: decreased activity tolerance,  decreased mobility, decreased ROM, decreased strength, and pain.   ACTIVITY LIMITATIONS: sleeping and stairs  PARTICIPATION LIMITATIONS: shopping, community activity, and occupation  PERSONAL FACTORS: Past/current experiences, Time since onset of injury/illness/exacerbation, and 3+ comorbidities: chronic low back pain, fibromyalgia, chronic headache, HTN, HLD are also affecting patient's functional outcome.   REHAB POTENTIAL: Good  CLINICAL DECISION MAKING:  Stable/uncomplicated  EVALUATION COMPLEXITY: Low   GOALS: Goals reviewed with patient? Yes  SHORT TERM GOALS: Target date: 02/04/2024     Patient will be independent in home exercise program to improve strength/mobility for better functional independence with ADLs. Baseline: No HEP currently  Goal status: INITIAL   LONG TERM GOALS: Target date: 02/25/2024  1.  Pt will improve LEFS by 8 points or greater to indicate improved perceived function of the left ankle and LE  Baseline: 70 Goal status: INITIAL  2.  Pt will improve LLE ankle ROM to within 5 degrees of R LE to indicate improve mobility for daily tasks.  Baseline: see chart in objective  Goal status: INITIAL  3.  Pt will report ability to sleep through night without report of ankle discomfort greater than 3/10 pain for improved sleep quality and improved QOL Baseline: 7/10 Goal status: INITIAL  4.  Pt will improve L ankle strength to 5/5 or greater with DF, inv and eversion to demonstrate improved max force production in her L ankle complex.  Baseline: 4/5 in above categories Goal status: INITIAL      PLAN:  PT FREQUENCY: 1-2x/week  PT DURATION: 6 weeks  PLANNED INTERVENTIONS: 97164- PT Re-evaluation, 97750- Physical Performance Testing, 97110-Therapeutic exercises, 97530- Therapeutic activity, [id number]- Neuromuscular re-education, 97535- Self Care, [id number]- Manual therapy, [id number]- Gait training, [phone] (1-2 muscles), [id number] (3+ muscles)- Dry Needling, Patient/Family education, Balance training, Stair training, Taping, Joint mobilization, Joint manipulation, Cryotherapy, and Moist heat  PLAN FOR NEXT SESSION:   Progress Ankle stability/balance interventions. Review HEP and progress ankle strengthening as appropriate.     Chyrl London, PT Physical Therapist  [facility] [place]- Outpatient [phone] 02/23/2024, 5:05 PM  "

## 2024-02-25 ENCOUNTER — Other Ambulatory Visit (HOSPITAL_COMMUNITY): Payer: Self-pay

## 2024-02-29 ENCOUNTER — Ambulatory Visit: Payer: MEDICAID | Admitting: Physical Therapy

## 2024-02-29 ENCOUNTER — Other Ambulatory Visit: Payer: Self-pay

## 2024-02-29 MED ORDER — METOPROLOL SUCCINATE ER 25 MG PO TB24: 25.0000 mg | ORAL_TABLET | Freq: Every day | ORAL | 0 refills | Status: DC

## 2024-02-29 NOTE — Addendum Note (Signed)
 Addended by: BRIEN SALM on: 02/29/2024 04:25 PM   Modules accepted: Orders

## 2024-02-29 NOTE — Addendum Note (Signed)
 Addended by: BRIEN SALM on: 02/29/2024 04:18 PM   Modules accepted: Orders

## 2024-03-03 ENCOUNTER — Ambulatory Visit: Payer: MEDICAID | Attending: Student

## 2024-03-03 DIAGNOSIS — M25572 Pain in left ankle and joints of left foot: Secondary | ICD-10-CM | POA: Diagnosis present

## 2024-03-03 DIAGNOSIS — M25672 Stiffness of left ankle, not elsewhere classified: Secondary | ICD-10-CM | POA: Diagnosis present

## 2024-03-03 NOTE — Therapy (Signed)
 " OUTPATIENT PHYSICAL THERAPY LOWER EXTREMITY TREATMENT/RECERT   Patient Name: Kelsey Ho MRN: [id number] DOB:[birth year], 34 y.o., female Today's Date: 03/04/2024  END OF SESSION:  PT End of Session - 03/03/24 1630     Visit Number 7    Number of Visits 10    Date for Recertification  04/29/24    Authorization Type [employer]    Authorization Time Period 4 visits from 1/8-08/30/2024    Authorization - Visit Number 1    Authorization - Number of Visits 4    Progress Note Due on Visit 10    PT Start Time 1619    PT Stop Time 1703    PT Time Calculation (min) 44 min    Equipment Utilized During Treatment Gait belt    Activity Tolerance Patient tolerated treatment well    Behavior During Therapy WFL for tasks assessed/performed            Past Medical History:  Diagnosis Date   Pinched nerve    Past Surgical History:  Procedure Laterality Date   COSMETIC SURGERY     Patient Active Problem List   Diagnosis Date Noted   Mixed hyperlipidemia 12/01/2023   Labile hypertension 12/01/2023   Left thyroid  nodule 09/16/2023   History of cocaine use 09/08/2023   Goiter 09/08/2023   Secondary hypertension 08/27/2023   B12 deficiency 02/12/2023   Elevated hemoglobin 02/12/2023   BMI 25.0-25.9,adult 02/12/2023   Abnormal EKG 02/12/2023   Fibromyalgia 12/26/2021   High risk medication use 11/18/2021   Chronic bilateral low back pain with bilateral sciatica 10/29/2021   Tachycardia 10/29/2021   History of opioid abuse (HCC) 10/25/2021   Seizure-like activity (HCC) 06/18/2021   Difficulty sleeping 06/18/2021   Chronic daily headache 06/18/2021   Insomnia 06/11/2021   Chronic pain 06/11/2021   Anxiety 06/11/2021    PCP: Corwin Antu, FNP   REFERRING PROVIDER: Kip Lynwood Double, PA-C   REFERRING DIAG:  [phone] (ICD-10-CM) - Pain in left ankle and joints of left foot  S82.832D (ICD-10-CM) - Other fracture of upper and lower end of left fibula, subsequent encounter for  closed fracture with routine healing    THERAPY DIAG:  Left ankle pain, unspecified chronicity - Plan: PT plan of care cert/re-cert  Stiffness of left ankle, not elsewhere classified - Plan: PT plan of care cert/re-cert  Rationale for Evaluation and Treatment: Rehabilitation  ONSET DATE: 09/25/23  SUBJECTIVE:   SUBJECTIVE STATEMENT:  From today: Denies any ankle pain     From Eval: Pt reports fracturing her ankle and she was using a boot for a while. She reports she was walking her dog when she heard a pop in her ankle and had a fracture, she had been wearing heels a lot which she believes precipitated this. Pt reports pain at night and when she is sleeping. Pt is currently not in pain sitting but does have pain at night. At night patient pain in her left ankle ( up to 7/10). Pt has pain with stair climbing as well. Pt reports swelling in the ankle after activity which is frequent since she is on her feet a lot throughout her day. Pt reports she has a dog (150 lb) and he likes to play a lot. Pt hasn't had any falls.   PERTINENT HISTORY: Pmx of chronic pain, insomnia, chronic low back pain, fibromyalgia, chronic headache, drug use (clean now), HTN,  PAIN:  Are you having pain? Yes: NPRS scale: 0 sitting, up to 7 at night  Pain location: left ankle anterior Pain description:   Aggravating factors: stairs, activity Relieving factors: steroid pack, heating pad, red light therapy, epsom salt baths  PRECAUTIONS: None  RED FLAGS: None   WEIGHT BEARING RESTRICTIONS: No  FALLS:  Has patient fallen in last 6 months? No  LIVING ENVIRONMENT: Lives with: lives with their family Lives in: House/apartment Stairs: Yes: Internal: 1 flight steps; yes Has following equipment at home: None  OCCUPATION: pt is a horticulturist, commercial for trade and this problem is exacerbated by that   PLOF: Independent and no pain with ankle ever   PATIENT GOALS: improve pain in ankle at night and to improve swelling    NEXT MD VISIT: N/A  OBJECTIVE:  Note: Objective measures were completed at Evaluation unless otherwise noted.  DIAGNOSTIC FINDINGS: MR on 09/25/23:IMPRESSION: Subacute to chronic mildly displaced lateral malleolus fracture. Correlate with x-rays.   Either mild asymmetric degenerative edema versus focal stress reaction to the posterior and medial tibial plateau.  PATIENT SURVEYS:  LEFS   03/04/23  Survey date:     1: Any of your usual work, housework or school activities 3 3  2. Usual hobbies, recreational or sporting activities 3 3  3. Getting into/out of the bath 3 4  4. Walking between rooms 4 4  5. Putting on socks/shoes 4 4  6. Squatting  4 3  7. Lifting an object, like a bag of groceries from the floor 4 3  8. Performing light activities around your home 4 3  9. Performing heavy activities around your home 3 3  10. Getting into/out of a car 4 4  11. Walking 2 blocks 3 4  12. Walking 1 mile 3 4  13. Going up/down 10 stairs (1 flight) 3 3  14. Standing for 1 hour 2 3  15.  sitting for 1 hour 4 2  16. Running on even ground 3 4  17. Running on uneven ground 4 3  18. Making sharp turns while running fast 4 2  19. Hopping  4 3  20. Rolling over in bed 4 3  Score total:  70 64    COGNITION: Overall cognitive status: Within functional limits for tasks assessed     SENSATION: Not tested Some numbness noted inferior to lateral maleoli  EDEMA:  Not measured but min visual edema on R>L, likely worse later in day and without current medication regimen  MUSCLE LENGTH: L gastroc tightness noted  POSTURE: No Significant postural limitations  PALPATION: Tender to palpation on L along lateral gastroc, fibularis longis and brevis  LOWER EXTREMITY ROM:  Active ROM Right eval Left eval   Hip flexion     Hip extension     Hip abduction     Hip adduction     Hip internal rotation     Hip external rotation     Knee flexion     Knee extension     Ankle dorsiflexion  20 10 AROM R/L 55/50 Ankle Plantarflexion 23/16 Ankle Dorsiflexion 35/28 Ankle Inversion 25/18 Ankle Eversion *Indicates Pain  Ankle plantarflexion 40 45   Ankle inversion 30 20   Ankle eversion 15 15    (Blank rows = not tested)  LOWER EXTREMITY MMT:  MMT Right eval Left eval  Hip flexion    Hip extension    Hip abduction    Hip adduction    Hip internal rotation    Hip external rotation    Knee flexion    Knee extension  Ankle dorsiflexion 5 5  Ankle plantarflexion 5 4  Ankle inversion 5 4  Ankle eversion 5 4- pop noted   (Blank rows = not tested)    FUNCTIONAL TESTS:  Single leg stance 30 sec ea LE with increased postural sway and hip strategy needed on R compared to L  Able to complete 5 x single leg heel raises with ea LE  GAIT: Distance walked:  Assistive device utilized: None Level of assistance: Complete Independence Comments:                                                                                                                                 TREATMENT DATE: 03/04/2024   Standing AROM dorsiflexion against wall, feet placed  5 away from wall- flexing front knee forward toward wall 3x10- VC to keep heel on ground.  SLS on airex pad with dual task of arranging magnet letters in alphabetical order x several min  Physical therapy treatment session today consisted of completing assessment of goals and administration of testing as demonstrated and documented in flow sheet, treatment, and goals section of this note. Addition treatments may be found below.   AROM R/L 55/50 Ankle Plantarflexion 23/16 Ankle Dorsiflexion 35/28 Ankle Inversion 25/18 Ankle Eversion *Indicates Pain  MMT= 4/5 Left PF with some pain; 4+ left ankle DF/IV/EV  PATIENT EDUCATION: Education details: Exercise technique Person educated: Patient Education method: Explanation/demonstration Education comprehension: verbalized understanding; return demonstration   HOME  EXERCISE PROGRAM: Access Code: [email or web address] URL: [email or web address] Date: 01/20/2024 Prepared by: Reyes London  Exercises - Standing Ankle Dorsiflexion Self-Mobilization on Chair With Band  - 1 x daily - 3 sets - 10 reps - Kneeling Ankle Dorsiflexion Self-Mobilization with Towel  - 1 x daily - 3 sets - 10 reps - Seated Ankle Dorsiflexion with Resistance  - 1 x daily - 3 sets - 10 reps - Long Sitting Ankle Plantar Flexion with Resistance  - 1 x daily - 3 sets - 10 reps - Seated Ankle Eversion with Resistance  - 1 x daily - 3 sets - 10 reps - Seated Ankle Inversion with Resistance and Legs Crossed  - 1 x daily - 3 sets - 10 reps    Access Code: [email or web address] URL: [email or web address] Date: 01/14/2024 Prepared by: Lonni Gainer  Exercises - Long Sitting Calf Stretch with Strap  - 1 x daily - 7 x weekly - 3 sets - 30 sec hold   ASSESSMENT:  CLINICAL IMPRESSION: Patient is a 34 y.o. F who was seen today for physical therapy treatment for L ankle pain and limitations following a lateral malleolus fracture.  Patient was seen for recert visit and findings today suggest some good overall progress- improved Left AROM with decreased overall pain. Patient is unsure if pain is better due to just finishing her steroid medication treatment but happy with progress. She still presents with some ankle unsteadiness- more with dynamic standing balance activities including tandem or  SLS and will benefit from further training to challenge her balance and stabilize her ankle. She subjectively reports doing well and walking in clinic without limp but did score lower on her LEFS today. Patient's condition has the potential to improve in response to therapy. Maximum improvement is yet to be obtained. The anticipated improvement is attainable and reasonable in a generally predictable time. Pt will continue to benefit from skilled therapy to address remaining deficits in order to  improve overall QoL and return to PLOF.    OBJECTIVE IMPAIRMENTS: decreased activity tolerance, decreased mobility, decreased ROM, decreased strength, and pain.   ACTIVITY LIMITATIONS: sleeping and stairs  PARTICIPATION LIMITATIONS: shopping, community activity, and occupation  PERSONAL FACTORS: Past/current experiences, Time since onset of injury/illness/exacerbation, and 3+ comorbidities: chronic low back pain, fibromyalgia, chronic headache, HTN, HLD are also affecting patient's functional outcome.   REHAB POTENTIAL: Good  CLINICAL DECISION MAKING: Stable/uncomplicated  EVALUATION COMPLEXITY: Low   GOALS: Goals reviewed with patient? Yes  SHORT TERM GOALS: Target date: 02/04/2024     Patient will be independent in home exercise program to improve strength/mobility for better functional independence with ADLs. Baseline: No HEP currently; 03/03/2024- Patient able to perform current HEP independently but will add more dynamic balance and stability ankle exercises over remaining visits Goal status: PROGRESSING   LONG TERM GOALS: Target date: 04/29/2024  1.  Pt will improve LEFS by 8 points or greater to indicate improved perceived function of the left ankle and LE  Baseline: 70; 03/03/2024= 64 Goal status: ONGOING  2.  Pt will improve LLE ankle ROM to within 5 degrees of R LE to indicate improve mobility for daily tasks.  Baseline: see chart in objective; 03/03/2024= See chart- progressing Goal status: PROGRESSING   3.  Pt will report ability to sleep through night without report of ankle discomfort greater than 3/10 pain for improved sleep quality and improved QOL Baseline: 7/10; 03/03/2024- Past  2 days have been better but still having some pain at night making sleeping difficulty Goal status: ONGOING  4.  Pt will improve L ankle strength to 5/5 or greater with DF, inv and eversion to demonstrate improved max force production in her L ankle complex.  Baseline: 4/5 in above  categories: 03/03/2024= 4 to 4+/5 Left ankle Goal status: ONGOING  5. Patient will reports < or equal to 2/10 Left ankle pain (CONSISTENTLY)  with all standing/weight bearing activities for return to previous level of function.  Baseline: Inconsistent with pain- 0/10 today - relates to just completing steroid dose  Goal status: New        PLAN:  PT FREQUENCY: 1-2x/week  PT DURATION: 8 weeks  PLANNED INTERVENTIONS: 97164- PT Re-evaluation, 97750- Physical Performance Testing, 97110-Therapeutic exercises, 97530- Therapeutic activity, [id number]- Neuromuscular re-education, 97535- Self Care, [id number]- Manual therapy, [id number]- Gait training, [phone] (1-2 muscles), [id number] (3+ muscles)- Dry Needling, Patient/Family education, Balance training, Stair training, Taping, Joint mobilization, Joint manipulation, Cryotherapy, and Moist heat  PLAN FOR NEXT SESSION:   Progress Ankle stability/balance interventions. Review HEP and progress ankle strengthening as appropriate.     Chyrl London, PT Physical Therapist  [facility]- Outpatient [phone] 03/04/2024, 10:19 AM  "

## 2024-03-08 ENCOUNTER — Ambulatory Visit: Payer: MEDICAID | Admitting: Physical Therapy

## 2024-03-10 ENCOUNTER — Ambulatory Visit: Payer: MEDICAID

## 2024-03-11 ENCOUNTER — Encounter: Payer: Self-pay | Admitting: Medical

## 2024-03-11 ENCOUNTER — Ambulatory Visit: Payer: MEDICAID | Attending: Medical | Admitting: Medical

## 2024-03-11 ENCOUNTER — Encounter: Payer: Self-pay | Admitting: Family

## 2024-03-11 VITALS — BP 98/64 | HR 102 | Ht 61.0 in | Wt 140.5 lb

## 2024-03-11 DIAGNOSIS — G8929 Other chronic pain: Secondary | ICD-10-CM | POA: Diagnosis present

## 2024-03-11 DIAGNOSIS — R0989 Other specified symptoms and signs involving the circulatory and respiratory systems: Secondary | ICD-10-CM | POA: Diagnosis present

## 2024-03-11 DIAGNOSIS — E559 Vitamin D deficiency, unspecified: Secondary | ICD-10-CM

## 2024-03-11 DIAGNOSIS — R000 Tachycardia, unspecified: Secondary | ICD-10-CM | POA: Insufficient documentation

## 2024-03-11 MED ORDER — METOPROLOL SUCCINATE ER 25 MG PO TB24: ORAL_TABLET | ORAL | 3 refills | Status: AC

## 2024-03-11 NOTE — Progress Notes (Signed)
 " Cardiology Office Note   Date:  03/11/2024  ID:  Kelsey Ho, DOB 08/26/1990, MRN [id number] PCP: Corwin Antu, FNP  [facility] Providers Cardiologist:  Lonni Hanson, MD   History of Present Illness Kelsey Ho is a 34 y.o. female with a history of anxiety, ADHD, polysubstance abuse, chronic pain, h/o drug use and opioid overdose who is being seen for follow-up of tachycardia and blood pressure.   Patient stopped using drugs about a year ago.  She is still using marijuana occasionally.   Patient was referred to Dr. Hanson in March 2025 for palpitations, chest pain, elevated heart rate and elevated blood pressure.  Patient had previously been seen in the ER with abdominal pain.  CT of the abdomen and pelvis showed bilateral nonobstructing nephrolithiasis but no acute intra-abdominal process.  Heart rate and blood pressure were both elevated.  Patient reported palpitations coinciding with cessation of opioids and alprazolam .  EKG in the office showed normal sinus rhythm.  Heart monitor and echocardiogram were ordered. Heart monitor showed NSR with rare ectopy.    She was see in follow-up 09/24/23 for elevated blood pressure in the ER and at home, but she was hypotensive at the visit. It was recommended she take amlodipine  in the AM and only in the PM if BP was elevated.    She was seen 10/09/23 reporting low blood pressures, she was not taking adderol or lorazepam . It was recommended to she hold amlodipine  and monitor BP.   The patient was last seen 12/09/23 reporting occasional elevated heart rates. She was going to receive a steroid shot, but HR was 122bpm, so this was deferred. She was taking Aderral and lorazepam . She was started on Toprol .   Today, the patient reports she is overall doing much better.  Back pain has improved, but is still present.  The patient reports she feels she is more aware of her heart rate at night. She does not note elevated heart rates or significant  palpitations. For her back, she has not done the injections yet. She did dry needling, but she felt this made it worse. She is taking Adderall intermittently throughout the week. She just got out of a round of steroids for her ankle pain.  Blood pressure is on the soft side.  She denies lightheadedness or dizziness.  Studies Reviewed EKG Interpretation Date/Time:  Friday March 11 2024 15:19:51 EST Ventricular Rate:  102 PR Interval:  128 QRS Duration:  74 QT Interval:  308 QTC Calculation: 401 R Axis:   46  Text Interpretation: Sinus tachycardia When compared with ECG of 24-Sep-2023 13:34, No significant change was found Confirmed by Franchester, Latrelle Bazar ([id number]) on 03/11/2024 3:23:03 PM    Echo 09/2023 1. Left ventricular ejection fraction, by estimation, is 60 to 65%. The  left ventricle has normal function. The left ventricle has no regional  wall motion abnormalities. Left ventricular diastolic parameters were  normal.   2. Right ventricular systolic function is normal. The right ventricular  size is normal.   3. The mitral valve is normal in structure. No evidence of mitral valve  regurgitation.   4. The aortic valve was not well visualized. Aortic valve regurgitation  is not visualized.   5. The inferior vena cava is normal in size with greater than 50%  respiratory variability, suggesting right atrial pressure of 3 mmHg.    Heart monitor 08/2023 Patch Wear Time:  12 days and 5 hours (2025-07-11T18:53:39-0400 to 2025-07-24T00:23:53-0400)   Patient had a min  HR of 45 bpm, max HR of 170 bpm, and avg HR of 88 bpm. Predominant underlying rhythm was Sinus Rhythm. Isolated SVEs were rare (<1.0%), and no SVE Couplets or SVE Triplets were present. Isolated VEs were rare (<1.0%), and no VE Couplets  or VE Triplets were present.       Physical Exam VS:  BP 98/64 (BP Location: Left Arm, Patient Position: Sitting, Cuff Size: Normal)   Pulse (!) 102   Ht 5' 1 (1.549 m)   Wt 140 lb 8 oz (63.7  kg)   SpO2 98%   BMI 26.55 kg/m        Wt Readings from Last 3 Encounters:  03/11/24 140 lb 8 oz (63.7 kg)  12/21/23 144 lb (65.3 kg)  12/09/23 147 lb 9.6 oz (67 kg)    GEN: Well nourished, well developed in no acute distress NECK: No JVD; No carotid bruits CARDIAC: RRR, no murmurs, rubs, gallops RESPIRATORY:  Clear to auscultation without rales, wheezing or rhonchi  ABDOMEN: Soft, non-tender, non-distended EXTREMITIES:  No edema; No deformity   ASSESSMENT AND PLAN  Sinus tachycardia Patient has h/o intermittent sinus tachycardia suspected from medication use (Adderall, Amitriptyline, possibly others) and chronic pain.  Heart monitor last year showed predominantly normal sinus rhythm with an average heart rate of 88 bpm, rare ectopy.  EKG today shows sinus tachycardia with a heart rate of 102 bpm.  Patient takes Adderall intermittently throughout the week.  Patient denies any chest pain, shortness of breath, or leg pain.  She has not had cocaine in over 1 year.  Doing a heart monitor would not likely change management.  She was started on Toprol  at the last visit.  We will increase this to 25 mg in the a.m. and 12.5 mg in the p.m. I will also check CBC, be met, TSH, magnesium.  Labile blood pressure Blood pressures are very labile generally due to her pain level.  Blood pressure today is 98/64.  We will add on low-dose Toprol  at night for her heart rate.  Recommended she monitor her blood pressure at home.  Chronic back pain Patient reports this has overall improved.  She may undergo back injections in the near future.    Dispo: Follow-up in 6 months  Signed, Nikolaj Geraghty VEAR Fishman, PA-C   "

## 2024-03-11 NOTE — Patient Instructions (Signed)
 Medication Instructions:  Your physician recommends the following medication changes.  INCREASE: Metoprolol  to 25 mg (1 tablet) in the morning and 12.5 mg (1/2 tablet) at night  *If you need a refill on your cardiac medications before your next appointment, please call your pharmacy*  Lab Work: No labs ordered today    Testing/Procedures: No test ordered today   Follow-Up: At [facility], you and your health needs are our priority.  As part of our continuing mission to provide you with exceptional heart care, our providers are all part of one team.  This team includes your primary Cardiologist (physician) and Advanced Practice Providers or APPs (Physician Assistants and Nurse Practitioners) who all work together to provide you with the care you need, when you need it.  Your next appointment:   6 month(s)  Provider:   Lonni Hanson, MD or Cadence Franchester, PA-C

## 2024-03-15 ENCOUNTER — Ambulatory Visit: Payer: MEDICAID | Admitting: Physical Therapy

## 2024-03-15 NOTE — Addendum Note (Signed)
 Addended by: CORWIN ANTU on: 03/15/2024 11:21 AM   Modules accepted: Orders

## 2024-03-17 ENCOUNTER — Ambulatory Visit: Payer: MEDICAID

## 2024-03-18 ENCOUNTER — Ambulatory Visit: Payer: Self-pay | Admitting: Medical

## 2024-03-18 LAB — COMPREHENSIVE METABOLIC PANEL WITH GFR
ALT: 11 IU/L (ref 0–32)
AST: 19 IU/L (ref 0–40)
Albumin: 4.8 g/dL (ref 3.9–4.9)
Alkaline Phosphatase: 72 IU/L (ref 41–116)
BUN/Creatinine Ratio: 9 (ref 9–23)
BUN: 8 mg/dL (ref 6–20)
Bilirubin Total: 0.8 mg/dL (ref 0.0–1.2)
CO2: 23 mmol/L (ref 20–29)
Calcium: 10.2 mg/dL (ref 8.7–10.2)
Chloride: 99 mmol/L (ref 96–106)
Creatinine, Ser: 0.91 mg/dL (ref 0.57–1.00)
Globulin, Total: 2.5 g/dL (ref 1.5–4.5)
Glucose: 79 mg/dL (ref 70–99)
Potassium: 4.2 mmol/L (ref 3.5–5.2)
Sodium: 139 mmol/L (ref 134–144)
Total Protein: 7.3 g/dL (ref 6.0–8.5)
eGFR: 85 mL/min/1.73

## 2024-03-18 LAB — CBC
Hematocrit: 43.1 % (ref 34.0–46.6)
Hemoglobin: 14.3 g/dL (ref 11.1–15.9)
MCH: 30.1 pg (ref 26.6–33.0)
MCHC: 33.2 g/dL (ref 31.5–35.7)
MCV: 91 fL (ref 79–97)
Platelets: 374 x10E3/uL (ref 150–450)
RBC: 4.75 x10E6/uL (ref 3.77–5.28)
RDW: 12.9 % (ref 11.7–15.4)
WBC: 8.8 x10E3/uL (ref 3.4–10.8)

## 2024-03-18 LAB — TSH: TSH: 1.62 u[IU]/mL (ref 0.450–4.500)

## 2024-03-18 LAB — MAGNESIUM: Magnesium: 1.9 mg/dL (ref 1.6–2.3)

## 2024-03-22 ENCOUNTER — Ambulatory Visit: Payer: MEDICAID

## 2024-03-24 ENCOUNTER — Ambulatory Visit: Payer: MEDICAID

## 2024-03-29 ENCOUNTER — Ambulatory Visit: Payer: MEDICAID

## 2024-03-31 ENCOUNTER — Ambulatory Visit: Payer: MEDICAID

## 2024-04-05 ENCOUNTER — Ambulatory Visit: Payer: MEDICAID

## 2024-04-07 ENCOUNTER — Ambulatory Visit: Payer: MEDICAID

## 2024-04-12 ENCOUNTER — Ambulatory Visit: Payer: MEDICAID

## 2024-04-13 ENCOUNTER — Ambulatory Visit: Payer: MEDICAID | Admitting: Physical Therapy

## 2024-04-14 ENCOUNTER — Ambulatory Visit: Payer: MEDICAID

## 2024-07-27 ENCOUNTER — Ambulatory Visit: Payer: MEDICAID | Admitting: Dermatology
